# Patient Record
Sex: Female | Born: 1955
Health system: Southern US, Community
[De-identification: ages and names within clinical notes are randomized; demographics above are authoritative.]

## PROBLEM LIST (undated history)

## (undated) DIAGNOSIS — I480 Paroxysmal atrial fibrillation: Secondary | ICD-10-CM

## (undated) DIAGNOSIS — I10 Essential (primary) hypertension: Secondary | ICD-10-CM

## (undated) DIAGNOSIS — E785 Hyperlipidemia, unspecified: Secondary | ICD-10-CM

## (undated) DIAGNOSIS — K5792 Diverticulitis of intestine, part unspecified, without perforation or abscess without bleeding: Secondary | ICD-10-CM

## (undated) DIAGNOSIS — M810 Age-related osteoporosis without current pathological fracture: Secondary | ICD-10-CM

## (undated) HISTORY — DX: Hyperlipidemia, unspecified: E78.5

## (undated) HISTORY — DX: Age-related osteoporosis without current pathological fracture: M81.0

## (undated) HISTORY — DX: Essential (primary) hypertension: I10

## (undated) HISTORY — DX: Diverticulitis of intestine, part unspecified, without perforation or abscess without bleeding: K57.92

## (undated) HISTORY — DX: Paroxysmal atrial fibrillation: I48.0

---

## 1998-05-01 ENCOUNTER — Ambulatory Visit (HOSPITAL_COMMUNITY): Admission: RE | Admit: 1998-05-01 | Discharge: 1998-05-01 | Payer: Self-pay | Admitting: Family Medicine

## 1999-08-04 ENCOUNTER — Other Ambulatory Visit: Admission: RE | Admit: 1999-08-04 | Discharge: 1999-08-04 | Payer: Self-pay | Admitting: Obstetrics and Gynecology

## 2000-08-23 ENCOUNTER — Other Ambulatory Visit: Admission: RE | Admit: 2000-08-23 | Discharge: 2000-08-23 | Payer: Self-pay | Admitting: Obstetrics and Gynecology

## 2001-08-28 ENCOUNTER — Other Ambulatory Visit: Admission: RE | Admit: 2001-08-28 | Discharge: 2001-08-28 | Payer: Self-pay | Admitting: Obstetrics and Gynecology

## 2001-12-22 ENCOUNTER — Encounter: Admission: RE | Admit: 2001-12-22 | Discharge: 2002-01-16 | Payer: Self-pay | Admitting: Family Medicine

## 2002-09-24 ENCOUNTER — Other Ambulatory Visit: Admission: RE | Admit: 2002-09-24 | Discharge: 2002-09-24 | Payer: Self-pay | Admitting: Obstetrics and Gynecology

## 2003-12-16 ENCOUNTER — Other Ambulatory Visit: Admission: RE | Admit: 2003-12-16 | Discharge: 2003-12-16 | Payer: Self-pay | Admitting: Obstetrics and Gynecology

## 2008-01-12 ENCOUNTER — Encounter: Payer: Self-pay | Admitting: Family Medicine

## 2009-09-01 ENCOUNTER — Ambulatory Visit: Payer: Self-pay | Admitting: Family Medicine

## 2009-09-01 DIAGNOSIS — E785 Hyperlipidemia, unspecified: Secondary | ICD-10-CM | POA: Insufficient documentation

## 2009-09-01 DIAGNOSIS — F17200 Nicotine dependence, unspecified, uncomplicated: Secondary | ICD-10-CM | POA: Insufficient documentation

## 2009-09-01 DIAGNOSIS — I10 Essential (primary) hypertension: Secondary | ICD-10-CM | POA: Insufficient documentation

## 2009-09-03 ENCOUNTER — Telehealth: Payer: Self-pay | Admitting: Family Medicine

## 2009-12-04 ENCOUNTER — Ambulatory Visit: Payer: Self-pay | Admitting: Family Medicine

## 2009-12-04 LAB — CONVERTED CEMR LAB
ALT: 18 units/L (ref 0–35)
AST: 19 units/L (ref 0–37)
Albumin: 4.1 g/dL (ref 3.5–5.2)
Cholesterol: 240 mg/dL — ABNORMAL HIGH (ref 0–200)
Direct LDL: 150.4 mg/dL
HDL: 89.3 mg/dL (ref >39.00)
Total Bilirubin: 0.6 mg/dL (ref 0.3–1.2)
Total CHOL/HDL Ratio: 3
Triglycerides: 97 mg/dL (ref 0.0–149.0)

## 2009-12-11 ENCOUNTER — Ambulatory Visit: Payer: Self-pay | Admitting: Family Medicine

## 2010-04-08 ENCOUNTER — Encounter: Payer: Self-pay | Admitting: Family Medicine

## 2010-06-01 ENCOUNTER — Telehealth: Payer: Self-pay | Admitting: Family Medicine

## 2010-06-03 ENCOUNTER — Ambulatory Visit: Payer: Self-pay | Admitting: Family Medicine

## 2010-07-01 ENCOUNTER — Ambulatory Visit: Payer: Self-pay | Admitting: Family Medicine

## 2010-07-02 LAB — CONVERTED CEMR LAB
CO2: 31 meq/L (ref 19–32)
Calcium: 10.1 mg/dL (ref 8.4–10.5)
Chloride: 98 meq/L (ref 96–112)
Creatinine, Ser: 0.6 mg/dL (ref 0.4–1.2)
Sodium: 138 meq/L (ref 135–145)

## 2010-11-10 NOTE — Progress Notes (Signed)
Summary: smoking cessation  Phone Note Call from Patient   Caller: Patient Call For: Evelena Peat MD Summary of Call: Pt wants something to help her quit smoking sent to [facility]. [phone] Initial call taken by: Lynann Beaver CMA,  June 01, 2010 2:14 PM  Follow-up for Phone Call        needs follow up to discuss.  With new data about Chantix need to discuss before use. Follow-up by: Evelena Peat MD,  June 01, 2010 5:43 PM  Additional Follow-up for Phone Call Additional follow up Details #1::        Appt scheduled. Additional Follow-up by: Lynann Beaver CMA,  June 02, 2010 8:15 AM

## 2010-11-10 NOTE — Assessment & Plan Note (Signed)
Summary: discuss smoking cessation/dm   Vital Signs:  Patient profile:   55 year old female Menstrual status:  postmenopausal Weight:      178 pounds Temp:     98.4 degrees F oral BP sitting:   160 / 90  (left arm) Cuff size:   regular  Vitals Entered By: Sid Falcon LPN (June 03, 2010 4:19 PM)  Serial Vital Signs/Assessments:  Time      Position  BP       Pulse  Resp  Temp     By                     172/92                         Evelena Peat MD   History of Present Illness: Patient here for the following.  Here to discuss smoking cessation. Currently smokes about one pack cigarettes per day. Has tried nicotine patches in the past without much success. She does have some interest in Chantix. Does take Prozac but no recent depressive issues. No history of any recent chest pains. No history of CAD.  History of hypertension treated with hydrochlorothiazide. Compliant with therapy. Denies any recent headaches or dizziness. No recent monitoring of blood pressure.  Hyperlipidemia treated with simvastatin. Compliant with therapy.  Hypertension History:      She denies headache, chest pain, palpitations, dyspnea with exertion, orthopnea, peripheral edema, visual symptoms, neurologic problems, syncope, and side effects from treatment.        Positive major cardiovascular risk factors include female age 63 years old or older, hyperlipidemia, hypertension, and current tobacco user.     Allergies (verified): No Known Drug Allergies  Past History:  Past Medical History: Last updated: 09/01/2009 Chicken pox Hyperlipidemia Hypertension  Family History: Last updated: 09/01/2009 Family History of Sudden Death Family history heart disease  Father 53s  Social History: Last updated: 09/01/2009 Occupation:  TEFL teacher Specialist Married Current Smoker, one ppd Alcohol use-yes Regular exercise-no  Risk Factors: Exercise: no (09/01/2009)  Risk Factors: Smoking  Status: current (12/11/2009) Packs/Day: 1.0 (12/11/2009) PMH-FH-SH reviewed for relevance  Review of Systems  The patient denies chest pain, syncope, dyspnea on exertion, peripheral edema, prolonged cough, headaches, hemoptysis, abdominal pain, melena, hematochezia, and severe indigestion/heartburn.    Physical Exam  General:  Well-developed,well-nourished,in no acute distress; alert,appropriate and cooperative throughout examination Neck:  No deformities, masses, or tenderness noted. Lungs:  Normal respiratory effort, chest expands symmetrically. Lungs are clear to auscultation, no crackles or wheezes. Heart:  normal rate, regular rhythm, and no gallop.   Extremities:  no edema.   Impression & Recommendations:  Problem # 1:  TOBACCO ABUSE (ICD-305.1) strategies discussed.  She is interested in Chantix.  No active depression.  Pt aware of recent data regarding and possible link (though small) to CAD.  She also realizes substantial cardiac risk assoc with smoking. Her updated medication list for this problem includes:    Chantix Starting Month Pak 0.5 Mg X 11 & 1 Mg X 42 Tabs (Varenicline tartrate) ..... Use as directed    Chantix Continuing Month Pak 1 Mg Tabs (Varenicline tartrate) ..... Use as directed  Problem # 2:  HYPERTENSION (ICD-401.9) Assessment: Deteriorated start lisinopril hctz.  Follow up one month. Her updated medication list for this problem includes:    Lisinopril-hydrochlorothiazide 10-12.5 Mg Tabs (Lisinopril-hydrochlorothiazide) ..... One by mouth once daily  Problem # 3:  HYPERLIPIDEMIA (ICD-272.4)  Her updated medication list for this problem includes:    Simvastatin 40 Mg Tabs (Simvastatin) ..... Once daily  Complete Medication List: 1)  Lisinopril-hydrochlorothiazide 10-12.5 Mg Tabs (Lisinopril-hydrochlorothiazide) .... One by mouth once daily 2)  Estradiol 0.5 Mg Tabs (Estradiol) .... Once daily 3)  Prometrium 100 Mg Caps (Progesterone micronized) ....  Once daily 4)  Prozac 20 Mg Caps (Fluoxetine hcl) .... Once daily 5)  Simvastatin 40 Mg Tabs (Simvastatin) .... Once daily 6)  Vitamin D (ergocalciferol) 50000 Unit Caps (Ergocalciferol) .... One tab weekly x 4 weeks 7)  Fish Oil 1000 Mg Caps (Omega-3 fatty acids) .... Once daily 8)  Chantix Starting Month Pak 0.5 Mg X 11 & 1 Mg X 42 Tabs (Varenicline tartrate) .... Use as directed 9)  Chantix Continuing Month Pak 1 Mg Tabs (Varenicline tartrate) .... Use as directed  Hypertension Assessment/Plan:      The patient's hypertensive risk group is category B: At least one risk factor (excluding diabetes) with no target organ damage.  Today's blood pressure is 160/90.    Patient Instructions: 1)  Stop smoking tips: Choose a quit date. Cut down before the quit date. Decide what you will do as a substitute when you feel the urge to smoke(gum, toothpick, exercise).  2)  Check your  Blood Pressure regularly . If it is above: 140/90  you should make an appointment. 3)  Please schedule a follow-up appointment in 1 month.  Prescriptions: LISINOPRIL-HYDROCHLOROTHIAZIDE 10-12.5 MG TABS (LISINOPRIL-HYDROCHLOROTHIAZIDE) one by mouth once daily  #30 x 5   Entered and Authorized by:   Evelena Peat MD   Signed by:   Evelena Peat MD on 06/03/2010   Method used:   Print then Give to Patient   RxID:   [id number] CHANTIX CONTINUING MONTH PAK 1 MG TABS (VARENICLINE TARTRATE) use as directed  #1 x 1   Entered and Authorized by:   Evelena Peat MD   Signed by:   Evelena Peat MD on 06/03/2010   Method used:   Print then Give to Patient   RxID:   [id number] CHANTIX STARTING MONTH PAK 0.5 MG X 11 & 1 MG X 42 TABS (VARENICLINE TARTRATE) use as directed  #1 x 0   Entered and Authorized by:   Evelena Peat MD   Signed by:   Evelena Peat MD on 06/03/2010   Method used:   Print then Give to Patient   RxID:   [id number]

## 2010-11-10 NOTE — Assessment & Plan Note (Signed)
Summary: 3 month rov/njr   Vital Signs:  Patient profile:   55 year old female Menstrual status:  postmenopausal Weight:      175 pounds Temp:     99.0 degrees F oral BP sitting:   130 / 82  (left arm) Cuff size:   regular  Vitals Entered By: Sid Falcon LPN (December 11, 452 4:08 PM)   History of Present Illness: Patient here for complete physical examination. Gets GYN care elsewhere.  Patient gets yearly mammograms in April. Pap Smears through gynecologist. Last tetanus unknown but probably greater than 10 years. No history of screening colonoscopy. Smokes one pack cigarettes per day. No regular exercise.  Recent lab work reviewed with patient. Elevated cholesterol but excellent HDL. Current medications reviewed. Chronic problems include hypertension, hyperlipidemia, and tobacco use.  Social history and family history reviewed and as recorded elsewhere.  patient relates one episode last December of some chest pressure which lasted a few minutes and resolved. None whatsoever since then. No symptoms with exercise or walking but she does not exercise consistently. She has some chronic dyspnea which she attributes to smoking which is unchanged.  Preventive Screening-Counseling & Management  Alcohol-Tobacco     Smoking Status: current     Packs/Day: 1.0     Year Quit: 1973  Allergies (verified): No Known Drug Allergies  Past History:  Past Medical History: Last updated: 09/01/2009 Chicken pox Hyperlipidemia Hypertension  Family History: Last updated: 09/01/2009 Family History of Sudden Death Family history heart disease  Father 67s  Social History: Last updated: 09/01/2009 Occupation:  Client Support Specialist Married Current Smoker, one ppd Alcohol use-yes Regular exercise-no  Risk Factors: Exercise: no (09/01/2009)  Risk Factors: Smoking Status: current (12/11/2009) Packs/Day: 1.0 (12/11/2009) PMH-FH-SH reviewed for relevance  Review of Systems   The patient complains of weight gain.  The patient denies anorexia, fever, weight loss, vision loss, decreased hearing, hoarseness, chest pain, syncope, dyspnea on exertion, peripheral edema, prolonged cough, headaches, hemoptysis, abdominal pain, melena, hematochezia, severe indigestion/heartburn, hematuria, incontinence, muscle weakness, suspicious skin lesions, depression, enlarged lymph nodes, and breast masses.    Physical Exam  General:  Well-developed,well-nourished,in no acute distress; alert,appropriate and cooperative throughout examination Head:  Normocephalic and atraumatic without obvious abnormalities. No apparent alopecia or balding. Eyes:  No corneal or conjunctival inflammation noted. EOMI. Perrla. Funduscopic exam benign, without hemorrhages, exudates or papilledema. Vision grossly normal. Ears:  External ear exam shows no significant lesions or deformities.  Otoscopic examination reveals clear canals, tympanic membranes are intact bilaterally without bulging, retraction, inflammation or discharge. Hearing is grossly normal bilaterally. Mouth:  Oral mucosa and oropharynx without lesions or exudates.  Teeth in good repair. Neck:  No deformities, masses, or tenderness noted. Breasts:  per GYN Lungs:  Normal respiratory effort, chest expands symmetrically. Lungs are clear to auscultation, no crackles or wheezes. Heart:  Normal rate and regular rhythm. S1 and S2 normal without gallop, murmur, click, rub or other extra sounds. Abdomen:  Bowel sounds positive,abdomen soft and non-tender without masses, organomegaly or hernias noted. Genitalia:  per GYN Msk:  No deformity or scoliosis noted of thoracic or lumbar spine.   Extremities:  No clubbing, cyanosis, edema, or deformity noted with normal full range of motion of all joints.   Neurologic:  alert & oriented X3, cranial nerves II-XII intact, and strength normal in all extremities.   Skin:  Intact without suspicious lesions or  rashes Cervical Nodes:  No lymphadenopathy noted Psych:  Cognition and judgment appear intact.  Alert and cooperative with normal attention span and concentration. No apparent delusions, illusions, hallucinations   Impression & Recommendations:  Problem # 1:  Preventive Health Care (ICD-V70.0) patient needs to quit smoking. Discussed at length. Tetanus booster given. Labs reviewed with patient. Baseline EKG obtained and unremarkable. Continue regular GYN checkups. Work on weight loss and exercise. Prompt followup if she has any increased dyspnea or any chest symptoms with exercise. Patient needs screening colonoscopy but refuses at this time. She will contact us if she is willing to schedule.  Complete Medication List: 1)  Hydrochlorothiazide 12.5 Mg Caps (Hydrochlorothiazide) .... Once daily 2)  Estradiol 0.5 Mg Tabs (Estradiol) .... Once daily 3)  Prometrium 100 Mg Caps (Progesterone micronized) .... Once daily 4)  Prozac 20 Mg Caps (Fluoxetine hcl) .... Once daily 5)  Simvastatin 40 Mg Tabs (Simvastatin) .... Once daily 6)  Vitamin D (ergocalciferol) 50000 Unit Caps (Ergocalciferol) .... One tab weekly x 4 weeks 7)  Fish Oil 1000 Mg Caps (Omega-3 fatty acids) .... Once daily  Other Orders: Tdap => 74yrs IM (56213) Admin 1st Vaccine (08657)  Patient Instructions: 1)  Stop smoking tips: Choose a quit date. Cut down before the quit date. Decide what you will do as a substitute when you feel the urge to smoke(gum, toothpick, exercise).  2)  You need to consider colonoscopy later this year. Contact us if you're willing to schedule. 3)  Check your  Blood Pressure regularly . If it is above:140/90   you should make an appointment. 4)  It is important that you exercise reguarly at least 20 minutes 5 times a week. If you develop chest pain, have severe difficulty breathing, or feel very tired, stop exercising immediately and seek medical attention.  5)  Schedule your mammogram.   Prescriptions: SIMVASTATIN 40 MG TABS (SIMVASTATIN) once daily  #90 x 3   Entered and Authorized by:   Evelena Peat MD   Signed by:   Evelena Peat MD on 12/11/2009   Method used:   Print then Give to Patient   RxID:   [id number] PROZAC 20 MG CAPS (FLUOXETINE HCL) once daily  #90 x 3   Entered and Authorized by:   Evelena Peat MD   Signed by:   Evelena Peat MD on 12/11/2009   Method used:   Print then Give to Patient   RxID:   [id number] HYDROCHLOROTHIAZIDE 12.5 MG CAPS (HYDROCHLOROTHIAZIDE) once daily  #90 x 3   Entered and Authorized by:   Evelena Peat MD   Signed by:   Evelena Peat MD on 12/11/2009   Method used:   Print then Give to Patient   RxID:   [id number]    Immunizations Administered:  Tetanus Vaccine:    Vaccine Type: Tdap    Site: left deltoid    Mfr: GlaxoSmithKline    Dose: 0.5 ml    Route: IM    Given by: Sid Falcon LPN    Exp. Date: 12/06/2011    Lot #: [id number]

## 2010-11-10 NOTE — Assessment & Plan Note (Signed)
Summary: 1 month rov/njr   Vital Signs:  Patient profile:   55 year old female Menstrual status:  postmenopausal Weight:      178 pounds Temp:     98.7 degrees F oral BP sitting:   132 / 74  (left arm) Cuff size:   regular  Vitals Entered By: Sid Falcon LPN (July 01, 2010 8:40 AM) CC: one month follow-up   History of Present Illness: Patient here for followup hypertension. Started lisinopril HCTZ and tolerating well no side effects. No cough. Blood pressure ranging 130-150 systolic at home. No dizziness.  Patient quit smoking since last visit. Never filled Chantix.   She is using nicotine patch.  Allergies (verified): No Known Drug Allergies  Past History:  Past Medical History: Last updated: 09/01/2009 Chicken pox Hyperlipidemia Hypertension PMH reviewed for relevance  Physical Exam  General:  Well-developed,well-nourished,in no acute distress; alert,appropriate and cooperative throughout examination Neck:  No deformities, masses, or tenderness noted. Lungs:  Normal respiratory effort, chest expands symmetrically. Lungs are clear to auscultation, no crackles or wheezes. Heart:  Normal rate and regular rhythm. S1 and S2 normal without gallop, murmur, click, rub or other extra sounds.   Impression & Recommendations:  Problem # 1:  HYPERTENSION (ICD-401.9) Assessment Improved check BMP. Her updated medication list for this problem includes:    Lisinopril-hydrochlorothiazide 10-12.5 Mg Tabs (Lisinopril-hydrochlorothiazide) ..... One by mouth once daily  Orders: TLB-BMP (Basic Metabolic Panel-BMET) (80048-METABOL) Specimen Handling (16109) Venipuncture (60454)  Complete Medication List: 1)  Lisinopril-hydrochlorothiazide 10-12.5 Mg Tabs (Lisinopril-hydrochlorothiazide) .... One by mouth once daily 2)  Estradiol 0.5 Mg Tabs (Estradiol) .... Once daily 3)  Prometrium 100 Mg Caps (Progesterone micronized) .... Once daily 4)  Prozac 20 Mg Caps (Fluoxetine  hcl) .... Once daily 5)  Simvastatin 40 Mg Tabs (Simvastatin) .... Once daily 6)  Vitamin D (ergocalciferol) 50000 Unit Caps (Ergocalciferol) .... One tab weekly x 4 weeks 7)  Fish Oil 1000 Mg Caps (Omega-3 fatty acids) .... Once daily  Patient Instructions: 1)  Please schedule a follow-up appointment in 6 months .  2)  Check your  Blood Pressure regularly . If it is above:140/90   you should make an appointment.

## 2010-12-29 ENCOUNTER — Encounter: Payer: Self-pay | Admitting: Family Medicine

## 2010-12-30 ENCOUNTER — Ambulatory Visit (INDEPENDENT_AMBULATORY_CARE_PROVIDER_SITE_OTHER): Payer: 59 | Admitting: Family Medicine

## 2010-12-30 ENCOUNTER — Encounter: Payer: Self-pay | Admitting: Family Medicine

## 2010-12-30 DIAGNOSIS — R519 Headache, unspecified: Secondary | ICD-10-CM

## 2010-12-30 DIAGNOSIS — H6690 Otitis media, unspecified, unspecified ear: Secondary | ICD-10-CM

## 2010-12-30 DIAGNOSIS — H6691 Otitis media, unspecified, right ear: Secondary | ICD-10-CM

## 2010-12-30 DIAGNOSIS — I10 Essential (primary) hypertension: Secondary | ICD-10-CM

## 2010-12-30 DIAGNOSIS — R51 Headache: Secondary | ICD-10-CM

## 2010-12-30 MED ORDER — LISINOPRIL-HYDROCHLOROTHIAZIDE 20-12.5 MG PO TABS: 1.0000 | ORAL_TABLET | Freq: Every day | ORAL | Status: DC

## 2010-12-30 MED ORDER — AMOXICILLIN 875 MG PO TABS: 875.0000 mg | ORAL_TABLET | Freq: Two times a day (BID) | ORAL | Status: AC

## 2010-12-30 NOTE — Progress Notes (Signed)
  Subjective:    Patient ID: Stephanie [name], female    DOB: 1956-07-30, 55 y.o.   MRN: [id number]  HPI  Patient seen for the several following items   Hypertension treated with lisinopril HCTZ. Not monitoring blood pressures frequent but usually over 140/90. No headaches or dizziness. No side effects from medication.    Recent URI type symptoms. No ear pain. Nasal congestion. No  purulent nasal discharge. Rare cough. No fever.    She describes intermittent somewhat progressive headaches which are very transient usually lasting about one minute which occur unpredictably and start occipital and spread frontally and bilateral. Activity worsens. No associated nausea, vomiting, visual changes, or any photophobia. No history of migraines. Has tried tapering caffeine recently but again these headaches are very transient usually lasting one minute.   Review of Systems  Constitutional: Negative for fever, chills, appetite change and fatigue.  Eyes: Negative for photophobia and visual disturbance.  Respiratory: Negative for cough, shortness of breath and wheezing.   Cardiovascular: Negative for chest pain, palpitations and leg swelling.  Neurological: Positive for headaches. Negative for dizziness, tremors, seizures, syncope and weakness.  Hematological: Negative for adenopathy.  Psychiatric/Behavioral: Negative for confusion.       Objective:   Physical Exam  patient is alert and healthy in appearance  Blood pressure 150/90 by my check Oropharynx is clear Right eardrum is erythematous was somewhat distorted landmarks. Left ear drum is normal Oropharynx is moist and clear Neck supple with no adenopathy Chest good auscultation Heart regular rate Neuro exam cranial nerves all intact. No focal strength deficits. No ataxia.       Assessment & Plan:   #1 hypertension suboptimal control. Increase lisinopril HCTZ 20/12.5 one daily and reassess blood pressure within 3 months. Consider fasting  lipids then.  #2 recent URI with probable early right suppurative otitis. Stat 875 mg amoxicillin twice a day for 10 days #3 atypical headaches. Referral headache wellness Center given the fact these do not fit criteria for migraine and do not sound like tension type

## 2011-01-24 ENCOUNTER — Emergency Department (HOSPITAL_BASED_OUTPATIENT_CLINIC_OR_DEPARTMENT_OTHER)
Admission: EM | Admit: 2011-01-24 | Discharge: 2011-01-24 | Disposition: A | Discharge status: Home / Self Care | Payer: 59 | Attending: Emergency Medicine | Admitting: Emergency Medicine

## 2011-01-24 ENCOUNTER — Emergency Department (INDEPENDENT_AMBULATORY_CARE_PROVIDER_SITE_OTHER): Payer: 59

## 2011-01-24 DIAGNOSIS — W208XXA Other cause of strike by thrown, projected or falling object, initial encounter: Secondary | ICD-10-CM | POA: Insufficient documentation

## 2011-01-24 DIAGNOSIS — E785 Hyperlipidemia, unspecified: Secondary | ICD-10-CM | POA: Insufficient documentation

## 2011-01-24 DIAGNOSIS — Z79899 Other long term (current) drug therapy: Secondary | ICD-10-CM | POA: Insufficient documentation

## 2011-01-24 DIAGNOSIS — Y92009 Unspecified place in unspecified non-institutional (private) residence as the place of occurrence of the external cause: Secondary | ICD-10-CM | POA: Insufficient documentation

## 2011-01-24 DIAGNOSIS — I10 Essential (primary) hypertension: Secondary | ICD-10-CM | POA: Insufficient documentation

## 2011-01-24 DIAGNOSIS — R42 Dizziness and giddiness: Secondary | ICD-10-CM | POA: Insufficient documentation

## 2011-01-24 DIAGNOSIS — S060X0A Concussion without loss of consciousness, initial encounter: Secondary | ICD-10-CM | POA: Insufficient documentation

## 2011-01-24 DIAGNOSIS — R51 Headache: Secondary | ICD-10-CM

## 2011-01-28 ENCOUNTER — Other Ambulatory Visit: Payer: Self-pay | Admitting: Family Medicine

## 2011-03-16 ENCOUNTER — Other Ambulatory Visit: Payer: Self-pay | Admitting: Family Medicine

## 2011-03-31 ENCOUNTER — Ambulatory Visit: Payer: 59 | Admitting: Family Medicine

## 2011-04-07 ENCOUNTER — Ambulatory Visit: Payer: 59 | Admitting: Family Medicine

## 2011-04-23 ENCOUNTER — Encounter: Payer: Self-pay | Admitting: Family Medicine

## 2011-04-23 ENCOUNTER — Ambulatory Visit (INDEPENDENT_AMBULATORY_CARE_PROVIDER_SITE_OTHER): Payer: 59 | Admitting: Family Medicine

## 2011-04-23 DIAGNOSIS — I10 Essential (primary) hypertension: Secondary | ICD-10-CM

## 2011-04-23 DIAGNOSIS — E785 Hyperlipidemia, unspecified: Secondary | ICD-10-CM

## 2011-04-23 NOTE — Progress Notes (Signed)
  Subjective:    Patient ID: Stephanie [name], female    DOB: [birth year], 55 y.o.   MRN: [id number]  HPI Patient seen for followup. Hypertension and hyperlipidemia history. Quit smoking last October. Compliant with medications. Overdue for lab work. She plans to schedule complete physical in the next few months. Still has some atypical headaches.  Evaluated [facility] and briefly used gabapentin but had side effects of sedation and dizziness. Headaches are somewhat infrequent at this time.  Past Medical History  Diagnosis Date  . Hypertension   . Hyperlipidemia    No past surgical history on file.  reports that she quit smoking about 10 months ago. Her smoking use included Cigarettes. She has a 30 pack-year smoking history. She does not have any smokeless tobacco history on file. She reports that she drinks alcohol. Her drug history not on file. family history includes Heart disease in her father and Sudden death in an unspecified family member. No Known Allergies    Review of Systems  Constitutional: Negative for fatigue.  Eyes: Negative for visual disturbance.  Respiratory: Negative for cough, chest tightness, shortness of breath and wheezing.   Cardiovascular: Negative for chest pain, palpitations and leg swelling.  Neurological: Negative for dizziness, seizures, syncope, weakness, light-headedness and headaches.       Objective:   Physical Exam  Constitutional: She appears well-developed and well-nourished. No distress.  Cardiovascular: Normal rate, regular rhythm and normal heart sounds.   No murmur heard. Pulmonary/Chest: Effort normal and breath sounds normal. No respiratory distress. She has no wheezes. She has no rales.  Musculoskeletal: She exhibits no edema.          Assessment & Plan:  #1 hypertension improved continue lisinopril HCTZ. Continue regular exercise with walking and weight loss #2 hyperlipidemia. Needs repeat lipids. We'll schedule complete  physical

## 2011-05-03 ENCOUNTER — Encounter: Payer: Self-pay | Admitting: Family Medicine

## 2011-06-16 ENCOUNTER — Other Ambulatory Visit (INDEPENDENT_AMBULATORY_CARE_PROVIDER_SITE_OTHER): Payer: 59

## 2011-06-16 DIAGNOSIS — Z0000 Encounter for general adult medical examination without abnormal findings: Secondary | ICD-10-CM

## 2011-06-16 LAB — POCT URINALYSIS DIPSTICK
Ketones, UA: NEGATIVE
Protein, UA: NEGATIVE
Spec Grav, UA: 1.005
pH, UA: 5.5

## 2011-06-16 LAB — HEPATIC FUNCTION PANEL
ALT: 18 U/L (ref 0–35)
AST: 18 U/L (ref 0–37)
Alkaline Phosphatase: 60 U/L (ref 39–117)
Bilirubin, Direct: 0 mg/dL (ref 0.0–0.3)
Total Protein: 6.9 g/dL (ref 6.0–8.3)

## 2011-06-16 LAB — BASIC METABOLIC PANEL
BUN: 9 mg/dL (ref 6–23)
CO2: 26 mEq/L (ref 19–32)
Calcium: 9.5 mg/dL (ref 8.4–10.5)
Glucose, Bld: 94 mg/dL (ref 70–99)
Potassium: 4.9 mEq/L (ref 3.5–5.1)
Sodium: 133 mEq/L — ABNORMAL LOW (ref 135–145)

## 2011-06-16 LAB — LDL CHOLESTEROL, DIRECT: Direct LDL: 132.6 mg/dL

## 2011-06-16 LAB — CBC WITH DIFFERENTIAL/PLATELET
Basophils Absolute: 0 10*3/uL (ref 0.0–0.1)
Eosinophils Absolute: 0.1 10*3/uL (ref 0.0–0.7)
HCT: 39.3 % (ref 36.0–46.0)
Hemoglobin: 13.1 g/dL (ref 12.0–15.0)
Lymphs Abs: 1.5 10*3/uL (ref 0.7–4.0)
MCHC: 33.3 g/dL (ref 30.0–36.0)
Neutro Abs: 4.1 10*3/uL (ref 1.4–7.7)
RDW: 12.9 % (ref 11.5–14.6)

## 2011-06-18 ENCOUNTER — Other Ambulatory Visit: Payer: 59

## 2011-06-24 ENCOUNTER — Encounter: Payer: Self-pay | Admitting: Family Medicine

## 2011-06-24 ENCOUNTER — Encounter: Payer: 59 | Admitting: Family Medicine

## 2011-06-24 ENCOUNTER — Ambulatory Visit (INDEPENDENT_AMBULATORY_CARE_PROVIDER_SITE_OTHER): Payer: 59 | Admitting: Family Medicine

## 2011-06-24 VITALS — BP 140/82 | HR 72 | Temp 98.6°F | Resp 12 | Ht 67.0 in | Wt 187.0 lb

## 2011-06-24 DIAGNOSIS — Z0000 Encounter for general adult medical examination without abnormal findings: Secondary | ICD-10-CM

## 2011-06-24 NOTE — Progress Notes (Signed)
  Subjective:    Patient ID: Stephanie [name], female    DOB: 1956-07-24, 55 y.o.   MRN: [id number]  HPI Patient here for complete physical. She sees a gynecologist for Pap smears. Mammogram earlier this year normal.  Receives flu vaccine through work. Quit smoking over one year ago. No history of colonoscopy. Pneumovax 2010. Tetanus last year. Patient not exercising consistently. Family history and social history reviewed and as below  Past Medical History  Diagnosis Date  . Hypertension   . Hyperlipidemia    No past surgical history on file.  reports that she quit smoking about 12 months ago. Her smoking use included Cigarettes. She has a 30 pack-year smoking history. She does not have any smokeless tobacco history on file. She reports that she drinks alcohol. Her drug history not on file. family history includes Heart disease in her father and Sudden death in an unspecified family member. No Known Allergies    Review of Systems  Constitutional: Negative for fever, activity change, appetite change and fatigue.  HENT: Negative for hearing loss, ear pain, sore throat and trouble swallowing.        Patient frequently clears her throat. She thinks this is probably due to postnasal drip. No active reflux symptoms. Denies hoarseness.  Eyes: Negative for visual disturbance.  Respiratory: Negative for cough and shortness of breath.   Cardiovascular: Negative for chest pain and palpitations.  Gastrointestinal: Negative for abdominal pain, diarrhea, constipation and blood in stool.  Genitourinary: Negative for dysuria and hematuria.  Musculoskeletal: Negative for myalgias, back pain and arthralgias.  Skin: Negative for rash.  Neurological: Negative for dizziness, syncope and headaches.  Hematological: Negative for adenopathy.  Psychiatric/Behavioral: Negative for confusion and dysphoric mood.       Objective:   Physical Exam  Constitutional: She is oriented to person, place, and time. She  appears well-developed and well-nourished.  HENT:  Head: Normocephalic and atraumatic.  Eyes: EOM are normal. Pupils are equal, round, and reactive to light.  Neck: Normal range of motion. Neck supple. No thyromegaly present.  Cardiovascular: Normal rate, regular rhythm and normal heart sounds.   No murmur heard. Pulmonary/Chest: Breath sounds normal. No respiratory distress. She has no wheezes. She has no rales.  Abdominal: Soft. Bowel sounds are normal. She exhibits no distension and no mass. There is no tenderness. There is no rebound and no guarding.  Genitourinary:       As per GYN  Musculoskeletal: Normal range of motion. She exhibits no edema.  Lymphadenopathy:    She has no cervical adenopathy.  Neurological: She is alert and oriented to person, place, and time. She displays normal reflexes. No cranial nerve deficit.  Skin: No rash noted.  Psychiatric: She has a normal mood and affect. Her behavior is normal. Judgment and thought content normal.          Assessment & Plan:  Health maintenance. Labs reviewed with patient and basically all favorable. Recommend colonoscopy the patient is still not sure she wishes to pursue. Reminder for flu vaccine. Continue GYN followup. Try over-the-counter Allegra for postnasal drip symptoms

## 2011-06-24 NOTE — Patient Instructions (Signed)
Try over the counter Allegra for postnasal drip symptoms. Let me know if you decide to pursue colonoscopy.

## 2011-12-06 ENCOUNTER — Other Ambulatory Visit: Payer: Self-pay | Admitting: Family Medicine

## 2012-01-09 ENCOUNTER — Other Ambulatory Visit: Payer: Self-pay | Admitting: Family Medicine

## 2012-01-17 ENCOUNTER — Telehealth: Payer: Self-pay | Admitting: *Deleted

## 2012-01-17 NOTE — Telephone Encounter (Signed)
(  triage voicemail) Pt wants to know if she has had a tetanus shot recently since her daughter is due to have a baby any day now.

## 2012-01-17 NOTE — Telephone Encounter (Signed)
T-Dap 2011, pt informed

## 2012-01-27 ENCOUNTER — Other Ambulatory Visit: Payer: Self-pay | Admitting: Family Medicine

## 2012-02-03 ENCOUNTER — Encounter: Payer: Self-pay | Admitting: Family Medicine

## 2012-02-03 ENCOUNTER — Ambulatory Visit (INDEPENDENT_AMBULATORY_CARE_PROVIDER_SITE_OTHER): Payer: 59 | Admitting: Family Medicine

## 2012-02-03 VITALS — BP 130/70 | Temp 98.1°F | Wt 187.0 lb

## 2012-02-03 DIAGNOSIS — R443 Hallucinations, unspecified: Secondary | ICD-10-CM

## 2012-02-03 DIAGNOSIS — R51 Headache: Secondary | ICD-10-CM

## 2012-02-03 DIAGNOSIS — G8929 Other chronic pain: Secondary | ICD-10-CM

## 2012-02-03 DIAGNOSIS — R442 Other hallucinations: Secondary | ICD-10-CM

## 2012-02-03 MED ORDER — NORTRIPTYLINE HCL 10 MG PO CAPS: 10.0000 mg | ORAL_CAPSULE | Freq: Every day | ORAL | Status: DC

## 2012-02-03 NOTE — Patient Instructions (Addendum)
  Increase Pamelor in 2 weeks to two tablets if no relief with one.

## 2012-02-03 NOTE — Progress Notes (Signed)
  Subjective:    Patient ID: Stephanie [name], female    DOB: May 10, 1956, 56 y.o.   MRN: [id number]  HPI  Patient seen with 2 week history of smelling cigarette smoke but no cigarette smoke present. 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  She quit smoking back in 2011. Other coworkers have not noted similar smell. Symptoms are constant. She has not had any loss of taste. Denies any nasal congestion or nasal drainage. No allergy symptoms.  Other issues she's had some chronic bilateral occipital headaches. These are sometimes dull sometimes throbbing. Sometimes 10 out of 10 severity. Intermittent usually occurring 10/30 days out of the month.  No clear triggers. She denies any associated appetite or weight changes, nausea, vomiting, photophobia, or any clear exacerbating features. Ibuprofen does not help. [facility] and was not given any treatment. Denies any radiculopathy symptoms. No numbness or weakness.  CT head 2012 no acute abnormality.  Past Medical History  Diagnosis Date  . Hypertension   . Hyperlipidemia    No past surgical history on file.  reports that she quit smoking about 20 months ago. Her smoking use included Cigarettes. She has a 30 pack-year smoking history. She does not have any smokeless tobacco history on file. She reports that she drinks alcohol. Her drug history not on file. family history includes Heart disease in her father and Sudden death in an unspecified family member. No Known Allergies    Review of Systems  Constitutional: Negative for appetite change, fatigue and unexpected weight change.  HENT: Negative for hearing loss, ear pain, nosebleeds, congestion, sore throat, rhinorrhea, sneezing, trouble swallowing, neck pain, postnasal drip, sinus pressure and tinnitus.   Respiratory: Negative for shortness of breath.   Cardiovascular: Negative for chest pain.  Neurological: Positive for headaches.  Negative for dizziness, tremors, seizures, syncope, speech difficulty, weakness and numbness.  Psychiatric/Behavioral: Negative for confusion.       Objective:   Physical Exam  Constitutional: She is oriented to person, place, and time. She appears well-developed and well-nourished.  HENT:  Right Ear: External ear normal.  Left Ear: External ear normal.  Nose: Nose normal.  Mouth/Throat: Oropharynx is clear and moist.  Neck: Neck supple. No thyromegaly present.  Cardiovascular: Normal rate and regular rhythm.   Pulmonary/Chest: Effort normal and breath sounds normal. No respiratory distress. She has no wheezes. She has no rales.  Musculoskeletal: She exhibits no edema.  Neurological: She is alert and oriented to person, place, and time. She has normal reflexes. No cranial nerve deficit.       No focal strength deficits. Cerebellar function normal.          Assessment & Plan:  #1 Phantosmia.  Duration of 2 weeks. We'll look into this further with some research to see if we can find any good evidence for treatment. She does not have any evidence for sinus infection #2 chronic headaches. No atypical features.  Question chronic tension-type. She's tried heat and ibuprofen without relief. Trial of nortriptyline 10 mg each bedtime and titrate for 2 weeks to 2 tablets if no relief. Touch base 3-4 weeks if no improvement

## 2012-05-03 ENCOUNTER — Encounter: Payer: Self-pay | Admitting: Family Medicine

## 2012-06-21 ENCOUNTER — Other Ambulatory Visit (INDEPENDENT_AMBULATORY_CARE_PROVIDER_SITE_OTHER): Payer: 59

## 2012-06-21 DIAGNOSIS — Z79899 Other long term (current) drug therapy: Secondary | ICD-10-CM

## 2012-06-21 DIAGNOSIS — Z0000 Encounter for general adult medical examination without abnormal findings: Secondary | ICD-10-CM

## 2012-06-21 LAB — CBC WITH DIFFERENTIAL/PLATELET
Basophils Absolute: 0 10*3/uL (ref 0.0–0.1)
Eosinophils Absolute: 0.1 10*3/uL (ref 0.0–0.7)
HCT: 40.3 % (ref 36.0–46.0)
Hemoglobin: 13.6 g/dL (ref 12.0–15.0)
Lymphs Abs: 1.5 10*3/uL (ref 0.7–4.0)
MCHC: 33.6 g/dL (ref 30.0–36.0)
MCV: 92.5 fl (ref 78.0–100.0)
Monocytes Absolute: 0.6 10*3/uL (ref 0.1–1.0)
Neutro Abs: 2.8 10*3/uL (ref 1.4–7.7)
RDW: 12.3 % (ref 11.5–14.6)

## 2012-06-21 LAB — LIPID PANEL
Cholesterol: 239 mg/dL — ABNORMAL HIGH (ref 0–200)
Total CHOL/HDL Ratio: 3
Triglycerides: 78 mg/dL (ref 0.0–149.0)
VLDL: 15.6 mg/dL (ref 0.0–40.0)

## 2012-06-21 LAB — POCT URINALYSIS DIPSTICK
Glucose, UA: NEGATIVE
Nitrite, UA: NEGATIVE
Protein, UA: NEGATIVE
Urobilinogen, UA: 0.2

## 2012-06-21 LAB — BASIC METABOLIC PANEL
CO2: 27 mEq/L (ref 19–32)
Calcium: 9.6 mg/dL (ref 8.4–10.5)
Chloride: 101 mEq/L (ref 96–112)
Glucose, Bld: 86 mg/dL (ref 70–99)
Potassium: 5.3 mEq/L — ABNORMAL HIGH (ref 3.5–5.1)
Sodium: 136 mEq/L (ref 135–145)

## 2012-06-21 LAB — HEPATIC FUNCTION PANEL: Albumin: 4.1 g/dL (ref 3.5–5.2)

## 2012-06-21 LAB — TSH: TSH: 1.46 u[IU]/mL (ref 0.35–5.50)

## 2012-06-22 LAB — LDL CHOLESTEROL, DIRECT: Direct LDL: 149.5 mg/dL

## 2012-06-28 ENCOUNTER — Encounter: Payer: Self-pay | Admitting: Family Medicine

## 2012-06-28 ENCOUNTER — Ambulatory Visit (INDEPENDENT_AMBULATORY_CARE_PROVIDER_SITE_OTHER): Payer: 59 | Admitting: Family Medicine

## 2012-06-28 VITALS — BP 122/72 | HR 72 | Temp 98.4°F | Resp 12 | Ht 67.0 in | Wt 189.0 lb

## 2012-06-28 DIAGNOSIS — Z23 Encounter for immunization: Secondary | ICD-10-CM

## 2012-06-28 DIAGNOSIS — Z0000 Encounter for general adult medical examination without abnormal findings: Secondary | ICD-10-CM

## 2012-06-28 NOTE — Addendum Note (Signed)
Addended by: Melchor Amour on: 06/28/2012 09:44 AM   Modules accepted: Orders

## 2012-06-28 NOTE — Progress Notes (Signed)
  Subjective:    Patient ID: Stephanie [name], female    DOB: 1955-12-26, 56 y.o.   MRN: [id number]  HPI  Patient seen for complete physical. She continues to see gynecologist. She is getting mammograms and Pap smears through them. Her other medical problems include history of hypertension and hyperlipidemia. She quit smoking couple years ago. Had some weight gain since then. No consistent exercise. No flu vaccine yet. She's never had screening colonoscopy and has refused in the past. She is still not agreeable to setting this up at this time. Generally feels well. No specific complaints. Tetanus up-to-date  Patient had some phantosmia last visit and those symptoms have fully resolved.  Past Medical History  Diagnosis Date  . Hypertension   . Hyperlipidemia    No past surgical history on file.  reports that she quit smoking about 2 years ago. Her smoking use included Cigarettes. She has a 30 pack-year smoking history. She does not have any smokeless tobacco history on file. She reports that she drinks alcohol. Her drug history not on file. family history includes Alzheimer's disease in her mother; Heart disease (age of onset:50) in her brother and father; and Sudden death in an unspecified family member. No Known Allergies    Review of Systems  Constitutional: Negative for fever, activity change, appetite change, fatigue and unexpected weight change.  HENT: Negative for hearing loss, ear pain, sore throat and trouble swallowing.   Eyes: Negative for visual disturbance.  Respiratory: Negative for cough, shortness of breath and wheezing.   Cardiovascular: Negative for chest pain and palpitations.  Gastrointestinal: Negative for abdominal pain, diarrhea, constipation and blood in stool.  Genitourinary: Negative for dysuria and hematuria.  Musculoskeletal: Negative for myalgias, back pain and arthralgias.  Skin: Negative for rash.  Neurological: Negative for dizziness, syncope and headaches.    Hematological: Negative for adenopathy.  Psychiatric/Behavioral: Negative for confusion and dysphoric mood.       Objective:   Physical Exam  Constitutional: She is oriented to person, place, and time. She appears well-developed and well-nourished.  HENT:  Head: Normocephalic and atraumatic.  Eyes: EOM are normal. Pupils are equal, round, and reactive to light.  Neck: Normal range of motion. Neck supple. No thyromegaly present.  Cardiovascular: Normal rate, regular rhythm and normal heart sounds.   No murmur heard. Pulmonary/Chest: Breath sounds normal. No respiratory distress. She has no wheezes. She has no rales.  Abdominal: Soft. Bowel sounds are normal. She exhibits no distension and no mass. There is no tenderness. There is no rebound and no guarding.  Genitourinary:       Per gyn   Musculoskeletal: Normal range of motion. She exhibits no edema.  Lymphadenopathy:    She has no cervical adenopathy.  Neurological: She is alert and oriented to person, place, and time. She displays normal reflexes. No cranial nerve deficit.  Skin: No rash noted.  Psychiatric: She has a normal mood and affect. Her behavior is normal. Judgment and thought content normal.          Assessment & Plan:  Health maintenance. Flu vaccine given. Colonoscopy recommended and patient refuses. She also declines Hemoccult cards. She'll let us know she change her mind. Discussed establishing regular exercise and gradual calorie reduction to achieve weight loss goals. Labs reviewed with patient. Just slightly high cholesterol but excellent HDL. She will continue GYN followup

## 2012-07-11 ENCOUNTER — Telehealth: Payer: Self-pay | Admitting: Family Medicine

## 2012-07-11 MED ORDER — LISINOPRIL-HYDROCHLOROTHIAZIDE 20-12.5 MG PO TABS: 1.0000 | ORAL_TABLET | Freq: Every day | ORAL | Status: DC

## 2012-07-11 MED ORDER — FLUOXETINE HCL 20 MG PO CAPS: 20.0000 mg | ORAL_CAPSULE | Freq: Every day | ORAL | Status: DC

## 2012-07-11 MED ORDER — SIMVASTATIN 40 MG PO TABS: 40.0000 mg | ORAL_TABLET | Freq: Every day | ORAL | Status: DC

## 2012-07-11 NOTE — Telephone Encounter (Signed)
She said they just changed mail order Rx companies at work and for some reason her Simvastatin, Fluoxetine and Lisinopril-HCTZ did not transfer.  She needs these sent to [facility] she doesn't  Have fax but phone is [phone]

## 2012-08-27 ENCOUNTER — Other Ambulatory Visit: Payer: Self-pay | Admitting: Family Medicine

## 2012-10-24 ENCOUNTER — Other Ambulatory Visit: Payer: Self-pay | Admitting: Obstetrics and Gynecology

## 2012-10-24 DIAGNOSIS — M949 Disorder of cartilage, unspecified: Secondary | ICD-10-CM

## 2012-10-24 DIAGNOSIS — Z780 Asymptomatic menopausal state: Secondary | ICD-10-CM

## 2012-10-24 DIAGNOSIS — M899 Disorder of bone, unspecified: Secondary | ICD-10-CM

## 2013-02-19 ENCOUNTER — Ambulatory Visit (INDEPENDENT_AMBULATORY_CARE_PROVIDER_SITE_OTHER): Payer: 59 | Admitting: Family Medicine

## 2013-02-19 ENCOUNTER — Encounter: Payer: Self-pay | Admitting: Family Medicine

## 2013-02-19 VITALS — BP 140/72 | Temp 99.3°F

## 2013-02-19 DIAGNOSIS — J209 Acute bronchitis, unspecified: Secondary | ICD-10-CM

## 2013-02-19 MED ORDER — AZITHROMYCIN 250 MG PO TABS: ORAL_TABLET | ORAL | Status: DC

## 2013-02-19 MED ORDER — HYDROCODONE-HOMATROPINE 5-1.5 MG/5ML PO SYRP: 5.0000 mL | ORAL_SOLUTION | Freq: Four times a day (QID) | ORAL | Status: AC | PRN

## 2013-02-19 NOTE — Patient Instructions (Addendum)

## 2013-02-19 NOTE — Progress Notes (Signed)
  Subjective:    Patient ID: Stephanie [name], female    DOB: September 14, 1956, 57 y.o.   MRN: [id number]  HPI  Acute visit Over one week history of bronchial inflammation and coughing. Cough productive of green sputum. No fevers or chills. Minimal nasal congestion. No sore throat. Cough especially bothersome at night. Mild relief with Robitussin Denies hemoptysis, dyspnea, or any appetite or weight changes. Possibly some mild wheezing at night. Ex-smoker. Quit 2011.  Past Medical History  Diagnosis Date  . Hypertension   . Hyperlipidemia    No past surgical history on file.  reports that she quit smoking about 2 years ago. Her smoking use included Cigarettes. She has a 30 pack-year smoking history. She does not have any smokeless tobacco history on file. She reports that  drinks alcohol. Her drug history is not on file. family history includes Alzheimer's disease in her mother; Heart disease (age of onset: 64) in her brother and father; and Sudden death in an unspecified family member. No Known Allergies   Review of Systems As per history of present illness    Objective:   Physical Exam  Constitutional: She appears well-developed and well-nourished.  HENT:  Right Ear: External ear normal.  Left Ear: External ear normal.  Mouth/Throat: Oropharynx is clear and moist.  Neck: Neck supple.  Cardiovascular: Normal rate and regular rhythm.   Pulmonary/Chest: Effort normal and breath sounds normal. No respiratory distress. She has no wheezes. She has no rales.  Lymphadenopathy:    She has no cervical adenopathy.          Assessment & Plan:  Acute bronchitis. Zithromax for 5 days. Hycodan cough syrup as needed for severe cough at night followup if symptoms persist or worsen

## 2013-03-08 ENCOUNTER — Telehealth: Payer: Self-pay | Admitting: Family Medicine

## 2013-03-08 MED ORDER — SIMVASTATIN 40 MG PO TABS: 40.0000 mg | ORAL_TABLET | Freq: Every day | ORAL | Status: DC

## 2013-03-08 NOTE — Telephone Encounter (Signed)
PT is requesting a 30 day supply of her simvastatin (ZOCOR) 40 MG tablet, be sent to [facility] [place] [facility]. Please assist.

## 2013-03-30 ENCOUNTER — Telehealth: Payer: Self-pay | Admitting: Family Medicine

## 2013-03-30 MED ORDER — FLUOXETINE HCL 20 MG PO CAPS: 20.0000 mg | ORAL_CAPSULE | Freq: Every day | ORAL | Status: DC

## 2013-03-30 NOTE — Telephone Encounter (Signed)
PT called and stated that she will also need a 30 day supply of FLUoxetine (PROZAC) 20 MG capsule [facility] to [facility] [place]. She stated that she would like this called in today because she is completely out, and it will take over a week for the mail order to arrive. Please assist.

## 2013-03-30 NOTE — Telephone Encounter (Signed)
PT called to request a 3 month supply of FLUoxetine (PROZAC) 20 MG capsule, be called into [facility]. Please assist.

## 2013-04-10 ENCOUNTER — Telehealth: Payer: Self-pay | Admitting: Family Medicine

## 2013-04-10 NOTE — Telephone Encounter (Signed)
PT called to request a 3 month supply of her FLUoxetine (PROZAC) 20 MG capsule, be sent to [facility]. Please assist.

## 2013-04-10 NOTE — Telephone Encounter (Signed)
Please Advise.... Patient just had a refill #30 no refills on 03/30/13 [facility]

## 2013-04-11 ENCOUNTER — Other Ambulatory Visit: Payer: Self-pay

## 2013-04-11 MED ORDER — FLUOXETINE HCL 20 MG PO CAPS: 20.0000 mg | ORAL_CAPSULE | Freq: Every day | ORAL | Status: DC

## 2013-04-11 NOTE — Telephone Encounter (Signed)
Refill for 6 months. 

## 2013-05-09 ENCOUNTER — Encounter: Payer: Self-pay | Admitting: Family Medicine

## 2013-05-24 ENCOUNTER — Encounter: Payer: Self-pay | Admitting: Family Medicine

## 2013-07-18 ENCOUNTER — Other Ambulatory Visit: Payer: Self-pay | Admitting: Family Medicine

## 2013-07-20 ENCOUNTER — Encounter: Payer: 59 | Admitting: Family Medicine

## 2013-08-30 ENCOUNTER — Other Ambulatory Visit (INDEPENDENT_AMBULATORY_CARE_PROVIDER_SITE_OTHER): Payer: 59

## 2013-08-30 DIAGNOSIS — Z0000 Encounter for general adult medical examination without abnormal findings: Secondary | ICD-10-CM

## 2013-08-30 LAB — CBC WITH DIFFERENTIAL/PLATELET
Basophils Relative: 1 % (ref 0.0–3.0)
Eosinophils Absolute: 0.1 10*3/uL (ref 0.0–0.7)
Eosinophils Relative: 1.7 % (ref 0.0–5.0)
HCT: 41.3 % (ref 36.0–46.0)
Lymphs Abs: 1.4 10*3/uL (ref 0.7–4.0)
MCHC: 34.2 g/dL (ref 30.0–36.0)
MCV: 90.8 fl (ref 78.0–100.0)
Monocytes Absolute: 0.7 10*3/uL (ref 0.1–1.0)
Neutrophils Relative %: 58.5 % (ref 43.0–77.0)
Platelets: 319 10*3/uL (ref 150.0–400.0)

## 2013-08-30 LAB — POCT URINALYSIS DIPSTICK
Ketones, UA: NEGATIVE
Leukocytes, UA: NEGATIVE
Nitrite, UA: NEGATIVE
Protein, UA: NEGATIVE
pH, UA: 7.5

## 2013-08-30 LAB — TSH: TSH: 0.72 u[IU]/mL (ref 0.35–5.50)

## 2013-08-30 LAB — BASIC METABOLIC PANEL
BUN: 11 mg/dL (ref 6–23)
CO2: 29 mEq/L (ref 19–32)
Chloride: 95 mEq/L — ABNORMAL LOW (ref 96–112)
Creatinine, Ser: 0.7 mg/dL (ref 0.4–1.2)
Potassium: 5 mEq/L (ref 3.5–5.1)

## 2013-08-30 LAB — HEPATIC FUNCTION PANEL
ALT: 20 U/L (ref 0–35)
Bilirubin, Direct: 0 mg/dL (ref 0.0–0.3)
Total Bilirubin: 1.1 mg/dL (ref 0.3–1.2)
Total Protein: 7.5 g/dL (ref 6.0–8.3)

## 2013-08-30 LAB — LIPID PANEL: Cholesterol: 249 mg/dL — ABNORMAL HIGH (ref 0–200)

## 2013-09-07 ENCOUNTER — Encounter: Payer: 59 | Admitting: Family Medicine

## 2013-09-11 ENCOUNTER — Ambulatory Visit (INDEPENDENT_AMBULATORY_CARE_PROVIDER_SITE_OTHER): Payer: 59 | Admitting: Family Medicine

## 2013-09-11 ENCOUNTER — Encounter: Payer: Self-pay | Admitting: Family Medicine

## 2013-09-11 VITALS — BP 134/80 | HR 58 | Temp 98.1°F | Wt 188.0 lb

## 2013-09-11 DIAGNOSIS — Z0000 Encounter for general adult medical examination without abnormal findings: Secondary | ICD-10-CM

## 2013-09-11 NOTE — Patient Instructions (Signed)
Let me know if we can help with scheduling colonoscopy.

## 2013-09-11 NOTE — Progress Notes (Signed)
   Subjective:    Patient ID: Stephanie [name], female    DOB: 1956-03-14, 57 y.o.   MRN: [id number]  HPI Patient seen for complete physical. She quit smoking about 3 years ago. She's had some steady weight gain since then. No consistent exercise. She is on lisinopril HCTZ for hypertension and simvastatin for hyperlipidemia. She has not had previous colonoscopy and is not yet ready to schedule. She sees gynecologist gets regular Pap smears and mammograms. She's had previous Pneumovax and has had flu vaccine  Past Medical History  Diagnosis Date  . Hypertension   . Hyperlipidemia    No past surgical history on file.  reports that she quit smoking about 3 years ago. Her smoking use included Cigarettes. She has a 30 pack-year smoking history. She does not have any smokeless tobacco history on file. She reports that she drinks alcohol. Her drug history is not on file. family history includes Alzheimer's disease in her mother; Heart disease (age of onset: 79) in her brother and father; Sudden death in an other family member. No Known Allergies    Review of Systems  Constitutional: Positive for fatigue. Negative for fever, activity change, appetite change and unexpected weight change.  HENT: Negative for ear pain, hearing loss, sore throat and trouble swallowing.   Eyes: Negative for visual disturbance.  Respiratory: Negative for cough and shortness of breath.   Cardiovascular: Negative for chest pain and palpitations.  Gastrointestinal: Negative for abdominal pain, diarrhea, constipation and blood in stool.  Endocrine: Negative for polydipsia and polyuria.  Genitourinary: Negative for dysuria and hematuria.  Musculoskeletal: Negative for arthralgias, back pain and myalgias.  Skin: Negative for rash.  Neurological: Negative for dizziness, syncope and headaches.  Hematological: Negative for adenopathy.  Psychiatric/Behavioral: Negative for confusion and dysphoric mood.       Objective:   Physical Exam  Constitutional: She is oriented to person, place, and time. She appears well-developed and well-nourished.  HENT:  Head: Normocephalic and atraumatic.  Eyes: EOM are normal. Pupils are equal, round, and reactive to light.  Neck: Normal range of motion. Neck supple. No thyromegaly present.  Cardiovascular: Normal rate, regular rhythm and normal heart sounds.   No murmur heard. Pulmonary/Chest: Breath sounds normal. No respiratory distress. She has no wheezes. She has no rales.  Abdominal: Soft. Bowel sounds are normal. She exhibits no distension and no mass. There is no tenderness. There is no rebound and no guarding.  Genitourinary:  Per GYN  Musculoskeletal: Normal range of motion. She exhibits no edema.  Lymphadenopathy:    She has no cervical adenopathy.  Neurological: She is alert and oriented to person, place, and time. She displays normal reflexes. No cranial nerve deficit.  Skin: No rash noted.  Psychiatric: She has a normal mood and affect. Her behavior is normal. Judgment and thought content normal.          Assessment & Plan:  Complete physical. Colonoscopy recommended and she will schedule when she is ready.  We recommend  more consistent exercise and working on weight loss. She'll continue GYN followup. Labs reviewed. We've recommended low saturated fat diet.

## 2013-09-11 NOTE — Progress Notes (Signed)
Pre visit review using our clinic review tool, if applicable. No additional management support is needed unless otherwise documented below in the visit note. 

## 2013-11-04 ENCOUNTER — Other Ambulatory Visit: Payer: Self-pay | Admitting: Family Medicine

## 2013-11-05 ENCOUNTER — Ambulatory Visit (INDEPENDENT_AMBULATORY_CARE_PROVIDER_SITE_OTHER): Payer: 59 | Admitting: Family Medicine

## 2013-11-05 ENCOUNTER — Encounter: Payer: Self-pay | Admitting: Family Medicine

## 2013-11-05 VITALS — BP 130/68 | HR 71 | Temp 98.0°F | Wt 185.0 lb

## 2013-11-05 DIAGNOSIS — J209 Acute bronchitis, unspecified: Secondary | ICD-10-CM

## 2013-11-05 MED ORDER — HYDROCODONE-HOMATROPINE 5-1.5 MG/5ML PO SYRP: 5.0000 mL | ORAL_SOLUTION | Freq: Four times a day (QID) | ORAL | Status: AC | PRN

## 2013-11-05 NOTE — Progress Notes (Signed)
   Subjective:    Patient ID: Stephanie [name], female    DOB: [birth year], 58 y.o.   MRN: [id number]  HPI Acute visit for sore throat and cough. Last Thursday she developed some cough which has been mostly dry. She has not had any fever. She's had some mild body aches and malaise. She's had some nasal congestion.  No dyspnea. No nausea or vomiting. No skin rashes. Minimal headache. She had difficulty sleeping last night. She took over-the-counter cough medication with minimal relief.  Past Medical History  Diagnosis Date  . Hypertension   . Hyperlipidemia    No past surgical history on file.  reports that she quit smoking about 3 years ago. Her smoking use included Cigarettes. She has a 30 pack-year smoking history. She does not have any smokeless tobacco history on file. She reports that she drinks alcohol. Her drug history is not on file. family history includes Alzheimer's disease in her mother; Heart disease (age of onset: 78) in her brother and father; Sudden death in an other family member. No Known Allergies    Review of Systems  Constitutional: Positive for fatigue.  HENT: Positive for congestion and sore throat.   Respiratory: Positive for cough. Negative for shortness of breath and wheezing.   Cardiovascular: Negative for chest pain.       Objective:   Physical Exam  Constitutional: She appears well-developed and well-nourished.  HENT:  Right Ear: External ear normal.  Left Ear: External ear normal.  Mouth/Throat: Oropharynx is clear and moist.  Neck: Neck supple.  Cardiovascular: Normal rate.   Pulmonary/Chest: Effort normal and breath sounds normal. No respiratory distress. She has no wheezes. She has no rales.  Lymphadenopathy:    She has no cervical adenopathy.          Assessment & Plan:  Acute viral syndrome. Hycodan cough syrup for nighttime use as needed. Plenty of fluids. Continue Advil for body aches. Followup as needed

## 2013-11-05 NOTE — Patient Instructions (Signed)
Acute Bronchitis Bronchitis is inflammation of the airways that extend from the windpipe into the lungs (bronchi). The inflammation often causes mucus to develop. This leads to a cough, which is the most common symptom of bronchitis.  In acute bronchitis, the condition usually develops suddenly and goes away over time, usually in a couple weeks. Smoking, allergies, and asthma can make bronchitis worse. Repeated episodes of bronchitis may cause further lung problems.  CAUSES Acute bronchitis is most often caused by the same virus that causes a cold. The virus can spread from person to person (contagious).  SIGNS AND SYMPTOMS   Cough.   Fever.   Coughing up mucus.   Body aches.   Chest congestion.   Chills.   Shortness of breath.   Sore throat.  DIAGNOSIS  Acute bronchitis is usually diagnosed through a physical exam. Tests, such as chest X-rays, are sometimes done to rule out other conditions.  TREATMENT  Acute bronchitis usually goes away in a couple weeks. Often times, no medical treatment is necessary. Medicines are sometimes given for relief of fever or cough. Antibiotics are usually not needed but may be prescribed in certain situations. In some cases, an inhaler may be recommended to help reduce shortness of breath and control the cough. A cool mist vaporizer may also be used to help thin bronchial secretions and make it easier to clear the chest.  HOME CARE INSTRUCTIONS  Get plenty of rest.   Drink enough fluids to keep your urine clear or pale yellow (unless you have a medical condition that requires fluid restriction). Increasing fluids may help thin your secretions and will prevent dehydration.   Only take over-the-counter or prescription medicines as directed by your health care provider.   Avoid smoking and secondhand smoke. Exposure to cigarette smoke or irritating chemicals will make bronchitis worse. If you are a smoker, consider using nicotine gum or skin  patches to help control withdrawal symptoms. Quitting smoking will help your lungs heal faster.   Reduce the chances of another bout of acute bronchitis by washing your hands frequently, avoiding people with cold symptoms, and trying not to touch your hands to your mouth, nose, or eyes.   Follow up with your health care provider as directed.  SEEK MEDICAL CARE IF: Your symptoms do not improve after 1 week of treatment.  SEEK IMMEDIATE MEDICAL CARE IF:  You develop an increased fever or chills.   You have chest pain.   You have severe shortness of breath.  You have bloody sputum.   You develop dehydration.  You develop fainting.  You develop repeated vomiting.  You develop a severe headache. MAKE SURE YOU:   Understand these instructions.  Will watch your condition.  Will get help right away if you are not doing well or get worse. Document Released: 11/04/2004 Document Revised: 05/30/2013 Document Reviewed: 03/20/2013 Exit[employer] Patient Information 2014 [employer].  

## 2013-11-05 NOTE — Progress Notes (Signed)
Pre visit review using our clinic review tool, if applicable. No additional management support is needed unless otherwise documented below in the visit note. 

## 2013-11-08 ENCOUNTER — Telehealth: Payer: Self-pay | Admitting: Family Medicine

## 2013-11-08 MED ORDER — AZITHROMYCIN 250 MG PO TABS: ORAL_TABLET | ORAL | Status: DC

## 2013-11-08 NOTE — Telephone Encounter (Signed)
Patient Information:  Caller Name: Dosha  Phone: [phone]  Patient: Stephanie [name]  Gender: Female  DOB: Jun 05, 1956  Age: 58 Years  PCP: Carolann Littler [facility])  Office Follow Up:  Does the office need to follow up with this patient?: Yes  Instructions For The Office: Patient states worse since office visit. Now with low grade fever. Eye drops (standing order called to pharmacy)  RN Note:  (1) Standing order for Polytrim eye drops 2 drps both eyes QID x5 days, 1 bottle, NR . [phone] [place]. Spoke with pharmacist provided standing order. Care advice reviewed.  (2) Patient states she is no better from office visit on 11/05/13. Now experiencing fever low grade. coughs till near emesis on Hycodan (causes nausea). Requesting Dr. Elease Hashimoto review.  Symptoms  [name] For Call & Symptoms: Patient in the office on Monday 11/05/13 and diagnosed with Vial Syndrome and given Hycodan for cough.  She states she is not better and is having low grade fever.  Last time taken was yesterday 99.7 (o), +sore throat, +coughing non productive, +body aches, +runny nose clear.  Patient states Left eye with drainage and lashes matted shut, sclera pink and irritated, no itching, +burning..  Exposure to pink eye from grandchildren  Reviewed Health History In EMR: Yes  Reviewed Medications In EMR: Yes  Reviewed Allergies In EMR: Yes  Reviewed Surgeries / Procedures: Yes  Date of Onset of Symptoms: 11/05/2013  Treatments Tried: Hycodan , Tussin DM , Ibuprofen  Treatments Tried Worked: Yes  Any Fever: Yes  Fever Taken: Tactile  Fever Time Of Reading: 11:00:00  Fever Last Reading: N/A  Guideline(s) Used:  Eye - Pus or Discharge  Cough  Disposition Per Guideline:   See Within 3 Days in Office  [name] For Disposition Reached:   Taking an ACE Inhibitor medication (e.g., benazepril/LOTENSIN, captopril/CAPOTEN, enalapril/VASOTEC, lisinopril/ZESTRIL)  Advice Given:  Eyelid  Cleansing:   Gently wash eyelids and lashes with warm water and wet cotton balls (or cotton gauze). Remove all the dried and liquid pus.  Do this as often as needed.  Contact Lenses:  Switch to glasses for a short time. This will help stop damage to your eye.  Contagiousness:  Pinkeye is contagious. It is very easy to spread to other people. You can spread it by shaking hands.  Try not to touch your eyes. Wash your hands often. Do not share towels. Try not to touch your eyes. Wash your hands frequently. Do not share towels.  You may return to work or school. Avoid physical contact (e.g., shaking hands) until the symptoms have resolved.  Call Back If  Pus increases in amount  Pus in corner of eye lasts over 3 days  Eyelid becomes red or swollen  You become worse.  Prescription Option for Antibiotic Eyedrops  Per Protocol - [place]:   If PCP approves calling in prescription, do so per protocol.  Prescription: Polytrim (polymyxin-trimethoprim) eyedrops, 5 ml bottle ($12)  Dosing: 1 to 2 drops four times daily for 5 to 7 days. Note: drop covers the adult eye.  Reassurance  Coughing is the way that our lungs remove irritants and mucus. It helps protect our lungs from getting pneumonia.  Cough Medicines:  Home Remedy - Honey: This old home remedy has been shown to help decrease coughing at night. The adult dosage is 2 teaspoons (10 ml) at bedtime. Honey should not be given to infants under one year of age.  Prevent Dehydration:  Drink adequate liquids.  This will help soothe an irritated or dry throat and loosen up the phlegm.  Coughing Spasms:  Drink warm fluids. Inhale warm mist ([name]: both relax the airway and loosen up the phlegm).  Suck on cough drops or hard candy to coat the irritated throat.  Call Back If:  Difficulty breathing  Cough lasts more than 3 weeks  Fever lasts > 3 days  You become worse.  RN Overrode Recommendation:  Patient Requests Prescription  Patient  states worse since office visit. Now with low grade fever. Eye drops (standing order called to pharmacy)

## 2013-11-08 NOTE — Telephone Encounter (Signed)
RX sent to pharmacy, called pt 5x and the line is busy every time.

## 2013-11-08 NOTE — Telephone Encounter (Signed)
Start Zithromax for 5 days (Z-pack) and office follow up if not better by next week.

## 2013-11-20 ENCOUNTER — Telehealth: Payer: Self-pay | Admitting: Family Medicine

## 2013-11-20 NOTE — Telephone Encounter (Signed)
Pt stated that she is feeling ok. She is able to keep water, etc. Pt informed that if she isnt feeling any better to set up appt.

## 2013-11-20 NOTE — Telephone Encounter (Signed)
Patient Information:  Caller Name: Stephanie [name]  Phone: [phone]  Patient: Stephanie [name] Reason  Gender: Female  DOB: [birth year]  Age: 58 Years  PCP: Stephanie [name] [facility])  Office Follow Up:  Does the office need to follow up with this patient?: No  Instructions For The Office: N/A  RN Note:  Gave information about food poisoning, symptoms, treatment.  Symptoms  Reason For Call & Symptoms: Sat 2/7 had shrimp to eat and became violently ill soon afterward with vomiting and diarrhea that continued all night - thinks she had food poisoning.   Stopped earing, but trying to drink fluids.  Vomiting and diarrhea stopped, then mild headache and nausea continues.  Dizziness at times.  Asking how long symptoms might last.  Reviewed Health History In EMR: Yes  Reviewed Medications In EMR: Yes  Reviewed Allergies In EMR: Yes  Reviewed Surgeries / Procedures: Yes  Date of Onset of Symptoms: 11/18/2013  Treatments Tried: stopped eating, drinking fluids  Treatments Tried Worked: No  Guideline(s) Used:  Vomiting  Dizziness  Disposition Per Guideline:   Home Care  Reason For Disposition Reached:   Poor fluid intake probably causing dizziness  Advice Given:  N/A  Patient Will Follow Care Advice:  YES

## 2013-12-15 ENCOUNTER — Other Ambulatory Visit: Payer: Self-pay | Admitting: Family Medicine

## 2014-05-21 ENCOUNTER — Encounter: Payer: Self-pay | Admitting: Family Medicine

## 2014-07-16 ENCOUNTER — Other Ambulatory Visit (INDEPENDENT_AMBULATORY_CARE_PROVIDER_SITE_OTHER): Payer: 59

## 2014-07-16 DIAGNOSIS — Z0000 Encounter for general adult medical examination without abnormal findings: Secondary | ICD-10-CM

## 2014-07-16 LAB — CBC WITH DIFFERENTIAL/PLATELET
BASOS PCT: 1.2 % (ref 0.0–3.0)
Basophils Absolute: 0.1 10*3/uL (ref 0.0–0.1)
EOS PCT: 3.8 % (ref 0.0–5.0)
Eosinophils Absolute: 0.2 10*3/uL (ref 0.0–0.7)
HCT: 39.2 % (ref 36.0–46.0)
Hemoglobin: 13.6 g/dL (ref 12.0–15.0)
Lymphocytes Relative: 28 % (ref 12.0–46.0)
Lymphs Abs: 1.2 10*3/uL (ref 0.7–4.0)
MCHC: 34.8 g/dL (ref 30.0–36.0)
MCV: 90.2 fl (ref 78.0–100.0)
Monocytes Absolute: 0.5 10*3/uL (ref 0.1–1.0)
Monocytes Relative: 11.2 % (ref 3.0–12.0)
NEUTROS PCT: 55.8 % (ref 43.0–77.0)
Neutro Abs: 2.5 10*3/uL (ref 1.4–7.7)
Platelets: 333 10*3/uL (ref 150.0–400.0)
RBC: 4.34 Mil/uL (ref 3.87–5.11)
RDW: 13 % (ref 11.5–15.5)
WBC: 4.4 10*3/uL (ref 4.0–10.5)

## 2014-07-16 LAB — POCT URINALYSIS DIPSTICK
Bilirubin, UA: NEGATIVE
Glucose, UA: NEGATIVE
Ketones, UA: NEGATIVE
Leukocytes, UA: NEGATIVE
Nitrite, UA: NEGATIVE
PH UA: 6.5
PROTEIN UA: NEGATIVE
RBC UA: NEGATIVE
UROBILINOGEN UA: 0.2

## 2014-07-17 LAB — BASIC METABOLIC PANEL
BUN: 13 mg/dL (ref 6–23)
CALCIUM: 9.5 mg/dL (ref 8.4–10.5)
CO2: 23 mEq/L (ref 19–32)
CREATININE: 0.6 mg/dL (ref 0.4–1.2)
Chloride: 100 mEq/L (ref 96–112)
GFR: 102.52 mL/min (ref >60.00)
Glucose, Bld: 83 mg/dL (ref 70–99)
Potassium: 5.1 mEq/L (ref 3.5–5.1)
Sodium: 134 mEq/L — ABNORMAL LOW (ref 135–145)

## 2014-07-17 LAB — LIPID PANEL
CHOL/HDL RATIO: 3
Cholesterol: 250 mg/dL — ABNORMAL HIGH (ref 0–200)
HDL: 80.9 mg/dL (ref >39.00)
LDL CALC: 157 mg/dL — AB (ref 0–99)
NonHDL: 169.1
Triglycerides: 63 mg/dL (ref 0.0–149.0)
VLDL: 12.6 mg/dL (ref 0.0–40.0)

## 2014-07-17 LAB — HEPATIC FUNCTION PANEL
ALK PHOS: 69 U/L (ref 39–117)
ALT: 18 U/L (ref 0–35)
AST: 19 U/L (ref 0–37)
Albumin: 4.2 g/dL (ref 3.5–5.2)
BILIRUBIN TOTAL: 0.7 mg/dL (ref 0.2–1.2)
Bilirubin, Direct: 0.1 mg/dL (ref 0.0–0.3)
TOTAL PROTEIN: 7.2 g/dL (ref 6.0–8.3)

## 2014-07-17 LAB — TSH: TSH: 1.89 u[IU]/mL (ref 0.35–4.50)

## 2014-07-24 ENCOUNTER — Encounter: Payer: Self-pay | Admitting: Family Medicine

## 2014-07-24 ENCOUNTER — Ambulatory Visit (INDEPENDENT_AMBULATORY_CARE_PROVIDER_SITE_OTHER): Payer: 59 | Admitting: Family Medicine

## 2014-07-24 VITALS — BP 128/70 | HR 68 | Temp 98.1°F | Ht 67.0 in | Wt 181.0 lb

## 2014-07-24 DIAGNOSIS — Z0000 Encounter for general adult medical examination without abnormal findings: Secondary | ICD-10-CM

## 2014-07-24 MED ORDER — LORAZEPAM 0.5 MG PO TABS: 0.5000 mg | ORAL_TABLET | Freq: Three times a day (TID) | ORAL | Status: DC | PRN

## 2014-07-24 NOTE — Patient Instructions (Signed)
Return for shingles vaccine at age 58.

## 2014-07-24 NOTE — Progress Notes (Signed)
Pre visit review using our clinic review tool, if applicable. No additional management support is needed unless otherwise documented below in the visit note. 

## 2014-07-24 NOTE — Progress Notes (Signed)
   Subjective:    Patient ID: Stephanie [name], female    DOB: 09-13-1956, 58 y.o.   MRN: [id number]  HPI Patient here for complete physical. She sees gynecologist regularly. She has declined colonoscopy screening. She plans to get shingles vaccine at age 51. She walks some for exercise. She has hyperlipidemia treated with simvastatin and hypertension treated with lisinopril HCTZ. Blood pressure stable. No orthostasis. No recent chest pains.  She takes fluoxetine for chronic anxiety.  Past Medical History  Diagnosis Date  . Hypertension   . Hyperlipidemia    No past surgical history on file.  reports that she quit smoking about 4 years ago. Her smoking use included Cigarettes. She has a 30 pack-year smoking history. She does not have any smokeless tobacco history on file. She reports that she drinks alcohol. Her drug history is not on file. family history includes Alzheimer's disease in her mother; Heart disease (age of onset: 48) in her brother and father; Sudden death in an other family member. No Known Allergies    Review of Systems  Constitutional: Negative for fever, activity change, appetite change, fatigue and unexpected weight change.  HENT: Negative for ear pain, hearing loss, sore throat and trouble swallowing.   Eyes: Negative for visual disturbance.  Respiratory: Negative for cough and shortness of breath.   Cardiovascular: Negative for chest pain and palpitations.  Gastrointestinal: Negative for abdominal pain, diarrhea, constipation and blood in stool.  Genitourinary: Negative for dysuria and hematuria.  Musculoskeletal: Negative for arthralgias, back pain and myalgias.  Skin: Negative for rash.  Neurological: Negative for dizziness, syncope and headaches.  Hematological: Negative for adenopathy.  Psychiatric/Behavioral: Negative for confusion and dysphoric mood.       Objective:   Physical Exam  Constitutional: She is oriented to person, place, and time. She appears  well-developed and well-nourished.  HENT:  Head: Normocephalic and atraumatic.  Eyes: EOM are normal. Pupils are equal, round, and reactive to light.  Neck: Normal range of motion. Neck supple. No thyromegaly present.  Cardiovascular: Normal rate, regular rhythm and normal heart sounds.   No murmur heard. Pulmonary/Chest: Breath sounds normal. No respiratory distress. She has no wheezes. She has no rales.  Abdominal: Soft. Bowel sounds are normal. She exhibits no distension and no mass. There is no tenderness. There is no rebound and no guarding.  Genitourinary:  Per gyn  Musculoskeletal: Normal range of motion. She exhibits no edema.  Lymphadenopathy:    She has no cervical adenopathy.  Neurological: She is alert and oriented to person, place, and time. She displays normal reflexes. No cranial nerve deficit.  Skin: No rash noted.  Psychiatric: She has a normal mood and affect. Her behavior is normal. Judgment and thought content normal.          Assessment & Plan:  Complete physical. Labs reviewed. She has persistent elevation in lipids treated currently with simvastatin. We discussed possible change to Lipitor or Crestor (or addition of Zetia) and she declines. She declines colonoscopy this time. She'll continue GYN followup. Tetanus up-to-date. Patient requesting very few lorazepam for severe anxiety. She uses extremely limited in the past for situations such as flying. Limited number lorazepam 0.5 mg #30 with no refill one every 6 hours when necessary

## 2014-09-16 ENCOUNTER — Telehealth: Payer: Self-pay | Admitting: Family Medicine

## 2014-09-16 MED ORDER — SIMVASTATIN 40 MG PO TABS: ORAL_TABLET | ORAL | Status: DC

## 2014-09-16 NOTE — Telephone Encounter (Signed)
Pt request refill simvastatin (ZOCOR) 40 MG tablet 90 day [facility] to [facility]  However pt is out and asked that a 30 day be sent to  [facility]

## 2014-09-16 NOTE — Telephone Encounter (Signed)
Rx sent to mail order and local pharmacy. 

## 2014-10-29 ENCOUNTER — Encounter: Payer: Self-pay | Admitting: Family Medicine

## 2014-10-29 MED ORDER — FLUOXETINE HCL 20 MG PO CAPS: ORAL_CAPSULE | ORAL | Status: DC

## 2014-10-29 NOTE — Telephone Encounter (Signed)
Rx for Prozac sent to pharmacy

## 2014-12-30 ENCOUNTER — Other Ambulatory Visit: Payer: Self-pay | Admitting: Family Medicine

## 2015-06-17 ENCOUNTER — Encounter: Payer: Self-pay | Admitting: Family Medicine

## 2015-07-17 ENCOUNTER — Other Ambulatory Visit: Payer: Self-pay | Admitting: Family Medicine

## 2015-07-23 ENCOUNTER — Other Ambulatory Visit (INDEPENDENT_AMBULATORY_CARE_PROVIDER_SITE_OTHER): Payer: 59

## 2015-07-23 DIAGNOSIS — Z0000 Encounter for general adult medical examination without abnormal findings: Secondary | ICD-10-CM | POA: Diagnosis not present

## 2015-07-23 LAB — CBC WITH DIFFERENTIAL/PLATELET
BASOS ABS: 0 10*3/uL (ref 0.0–0.1)
Basophils Relative: 0.8 % (ref 0.0–3.0)
EOS ABS: 0.1 10*3/uL (ref 0.0–0.7)
Eosinophils Relative: 2.1 % (ref 0.0–5.0)
HEMATOCRIT: 41 % (ref 36.0–46.0)
HEMOGLOBIN: 13.4 g/dL (ref 12.0–15.0)
LYMPHS PCT: 29.5 % (ref 12.0–46.0)
Lymphs Abs: 1.3 10*3/uL (ref 0.7–4.0)
MCHC: 32.8 g/dL (ref 30.0–36.0)
MCV: 93 fl (ref 78.0–100.0)
MONOS PCT: 11 % (ref 3.0–12.0)
Monocytes Absolute: 0.5 10*3/uL (ref 0.1–1.0)
NEUTROS ABS: 2.5 10*3/uL (ref 1.4–7.7)
Neutrophils Relative %: 56.6 % (ref 43.0–77.0)
Platelets: 338 10*3/uL (ref 150.0–400.0)
RBC: 4.41 Mil/uL (ref 3.87–5.11)
RDW: 12.6 % (ref 11.5–15.5)
WBC: 4.4 10*3/uL (ref 4.0–10.5)

## 2015-07-23 LAB — LIPID PANEL
CHOL/HDL RATIO: 3
Cholesterol: 253 mg/dL — ABNORMAL HIGH (ref 0–200)
HDL: 88.9 mg/dL (ref >39.00)
LDL CALC: 149 mg/dL — AB (ref 0–99)
NONHDL: 163.77
Triglycerides: 75 mg/dL (ref 0.0–149.0)
VLDL: 15 mg/dL (ref 0.0–40.0)

## 2015-07-23 LAB — HEPATIC FUNCTION PANEL
ALBUMIN: 4.2 g/dL (ref 3.5–5.2)
ALK PHOS: 78 U/L (ref 39–117)
ALT: 15 U/L (ref 0–35)
AST: 17 U/L (ref 0–37)
BILIRUBIN DIRECT: 0.1 mg/dL (ref 0.0–0.3)
TOTAL PROTEIN: 6.7 g/dL (ref 6.0–8.3)
Total Bilirubin: 0.9 mg/dL (ref 0.2–1.2)

## 2015-07-23 LAB — BASIC METABOLIC PANEL
BUN: 10 mg/dL (ref 6–23)
CALCIUM: 9.9 mg/dL (ref 8.4–10.5)
CO2: 30 meq/L (ref 19–32)
CREATININE: 0.64 mg/dL (ref 0.40–1.20)
Chloride: 96 mEq/L (ref 96–112)
GFR: 100.33 mL/min (ref >60.00)
Glucose, Bld: 89 mg/dL (ref 70–99)
Potassium: 5.3 mEq/L — ABNORMAL HIGH (ref 3.5–5.1)
SODIUM: 133 meq/L — AB (ref 135–145)

## 2015-07-23 LAB — TSH: TSH: 1.25 u[IU]/mL (ref 0.35–4.50)

## 2015-07-30 ENCOUNTER — Ambulatory Visit (INDEPENDENT_AMBULATORY_CARE_PROVIDER_SITE_OTHER): Payer: 59 | Admitting: Family Medicine

## 2015-07-30 ENCOUNTER — Encounter: Payer: Self-pay | Admitting: Family Medicine

## 2015-07-30 VITALS — BP 120/70 | HR 79 | Temp 98.5°F | Ht 67.32 in | Wt 185.9 lb

## 2015-07-30 DIAGNOSIS — Z0000 Encounter for general adult medical examination without abnormal findings: Secondary | ICD-10-CM

## 2015-07-30 DIAGNOSIS — H539 Unspecified visual disturbance: Secondary | ICD-10-CM

## 2015-07-30 MED ORDER — CLOTRIMAZOLE-BETAMETHASONE 1-0.05 % EX CREA: 1.0000 "application " | TOPICAL_CREAM | Freq: Two times a day (BID) | CUTANEOUS | Status: DC

## 2015-07-30 NOTE — Progress Notes (Signed)
Pre visit review using our clinic review tool, if applicable. No additional management support is needed unless otherwise documented below in the visit note. 

## 2015-07-30 NOTE — Progress Notes (Signed)
Subjective:    Patient ID: Stephanie [name], female    DOB: 11/15/1955, 59 y.o.   MRN: [id number]  HPI Patient seen for complete physical. She sees gynecologist but has not had a Pap smear in a couple years. She plans to set up GYN follow-up soon. She gets regular mammograms. She's never had a colonoscopy and declines at this time. She may reconsider later this year. She has history of hyperlipidemia, hypertension, chronic anxiety. Quit smoking 2011.  She's had some atypical very transient stabbing type headaches for about 8 or 9 years now. She has seen neurologist previously with no clear etiology. She states she had episode this past Sunday of transient blurred vision right eye which only last about 1 minute. She did not have any concomitant headache or any speech changes or any focal weakness.  Past Medical History  Diagnosis Date  . Hypertension   . Hyperlipidemia    No past surgical history on file.  reports that she quit smoking about 5 years ago. Her smoking use included Cigarettes. She has a 30 pack-year smoking history. She does not have any smokeless tobacco history on file. She reports that she drinks alcohol. Her drug history is not on file. family history includes Alzheimer's disease in her mother; Heart disease (age of onset: 98) in her brother and father; Sudden death in an other family member. No Known Allergies    Review of Systems  Constitutional: Negative for fever, activity change, appetite change, fatigue and unexpected weight change.  HENT: Negative for ear pain, hearing loss, sore throat and trouble swallowing.   Eyes: Positive for visual disturbance. Negative for photophobia, pain, discharge, redness and itching.  Respiratory: Negative for cough and shortness of breath.   Cardiovascular: Negative for chest pain and palpitations.  Gastrointestinal: Negative for abdominal pain, diarrhea, constipation and blood in stool.  Endocrine: Negative for polydipsia and  polyuria.  Genitourinary: Negative for dysuria and hematuria.  Musculoskeletal: Negative for myalgias, back pain and arthralgias.  Skin: Negative for rash.  Neurological: Negative for dizziness, syncope and headaches.  Hematological: Negative for adenopathy.  Psychiatric/Behavioral: Negative for confusion and dysphoric mood.       Objective:   Physical Exam  Constitutional: She is oriented to person, place, and time. She appears well-developed and well-nourished.  HENT:  Head: Normocephalic and atraumatic.  Eyes: EOM are normal. Pupils are equal, round, and reactive to light.  Neck: Normal range of motion. Neck supple. No thyromegaly present.  No carotid bruit  Cardiovascular: Normal rate, regular rhythm and normal heart sounds.   No murmur heard. Pulmonary/Chest: Breath sounds normal. No respiratory distress. She has no wheezes. She has no rales.  Abdominal: Soft. Bowel sounds are normal. She exhibits no distension and no mass. There is no tenderness. There is no rebound and no guarding.  Musculoskeletal: Normal range of motion. She exhibits no edema.  Lymphadenopathy:    She has no cervical adenopathy.  Neurological: She is alert and oriented to person, place, and time. She displays normal reflexes. No cranial nerve deficit.  Skin:  Angular cheilitis-right corner of mouth  Psychiatric: She has a normal mood and affect. Her behavior is normal. Judgment and thought content normal.          Assessment & Plan:  Complete physical. Flu vaccine already given. We recommended colonoscopy and she will consider at some point later this year. She is encouraged to exercise more consistently. Tetanus up-to-date  Transient blurred vision right eye a few days  ago. She did not have any other worrisome symptoms. This only lasted 1 minute. Doubt TIA. Set up carotid Dopplers. She does not have any carotid bruits.  Did not describe any other worrisome symptoms such as speech change, facial  weakness, or extremity weakness

## 2015-08-07 ENCOUNTER — Encounter: Payer: Self-pay | Admitting: Family Medicine

## 2015-08-14 ENCOUNTER — Ambulatory Visit (INDEPENDENT_AMBULATORY_CARE_PROVIDER_SITE_OTHER): Payer: 59 | Admitting: Family Medicine

## 2015-08-14 ENCOUNTER — Encounter: Payer: Self-pay | Admitting: Family Medicine

## 2015-08-14 VITALS — BP 124/80 | HR 69 | Temp 98.6°F | Ht 67.32 in | Wt 184.5 lb

## 2015-08-14 DIAGNOSIS — R300 Dysuria: Secondary | ICD-10-CM

## 2015-08-14 LAB — POCT URINALYSIS DIPSTICK
BILIRUBIN UA: NEGATIVE
GLUCOSE UA: NEGATIVE
Ketones, UA: NEGATIVE
NITRITE UA: NEGATIVE
Protein, UA: NEGATIVE
Spec Grav, UA: 1.01
UROBILINOGEN UA: 0.2
pH, UA: 6

## 2015-08-14 MED ORDER — NITROFURANTOIN MONOHYD MACRO 100 MG PO CAPS: 100.0000 mg | ORAL_CAPSULE | Freq: Two times a day (BID) | ORAL | Status: DC

## 2015-08-14 NOTE — Progress Notes (Signed)
HPI:  Acute visit for:  Dysuria: -started yesterday -symptoms: frequency, urgency, hematuria x1 -denies: fevers, malaise, chills, flank pain, vaginal discharge or bleeding, NVD  ROS: See pertinent positives and negatives per HPI.  Past Medical History  Diagnosis Date  . Hypertension   . Hyperlipidemia     No past surgical history on file.  Family History  Problem Relation Age of Onset  . Heart disease Father 38    CAD  . Sudden death      family hx  . Alzheimer's disease Mother   . Heart disease Brother 60    CAD    Social History   Social History  . Marital Status: Single    Spouse Name: N/A  . Number of Children: N/A  . Years of Education: N/A   Social History Main Topics  . Smoking status: Former Smoker -- 1.00 packs/day for 30 years    Types: Cigarettes    Quit date: 05/31/2010  . Smokeless tobacco: None  . Alcohol Use: Yes  . Drug Use: None  . Sexual Activity: Not Asked   Other Topics Concern  . None   Social History Narrative     Current outpatient prescriptions:  .  aspirin 81 MG tablet, Take 81 mg by mouth daily.  , Disp: , Rfl:  .  cholecalciferol (VITAMIN D) 1000 UNITS tablet, Take 1,000 Units by mouth daily.  , Disp: , Rfl:  .  clotrimazole-betamethasone (LOTRISONE) cream, Apply 1 application topically 2 (two) times daily., Disp: 30 g, Rfl: 0 .  fish oil-omega-3 fatty acids 1000 MG capsule, Take 2 g by mouth daily.  , Disp: , Rfl:  .  FLUoxetine (PROZAC) 20 MG capsule, Take 1 capsule by mouth  daily, Disp: 90 capsule, Rfl: 2 .  lisinopril-hydrochlorothiazide (PRINZIDE,ZESTORETIC) 20-12.5 MG tablet, TAKE ONE TABLET BY MOUTH ONCE DAILY, Disp: 90 tablet, Rfl: 2 .  simvastatin (ZOCOR) 40 MG tablet, Take 1 tablet by mouth at  bedtime, Disp: 30 tablet, Rfl: 0 .  nitrofurantoin, macrocrystal-monohydrate, (MACROBID) 100 MG capsule, Take 1 capsule (100 mg total) by mouth 2 (two) times daily., Disp: 14 capsule, Rfl: 0  EXAM:  Filed Vitals:   08/14/15 1040  BP: 124/80  Pulse: 69  Temp: 98.6 F (37 C)    Body mass index is 28.62 kg/(m^2).  GENERAL: vitals reviewed and listed above, alert, oriented, appears well hydrated and in no acute distress  HEENT: atraumatic, conjunttiva clear, no obvious abnormalities on inspection of external nose and ears  NECK: no obvious masses on inspection  LUNGS: clear to auscultation bilaterally, no wheezes, rales or rhonchi, good air movement  CV: HRRR, no peripheral edema  ABD: BS+, soft, NTTP, no CVA TTP  MS: moves all extremities without noticeable abnormality  PSYCH: pleasant and cooperative, no obvious depression or anxiety  ASSESSMENT AND PLAN:  Discussed the following assessment and plan:  Dysuria - Plan: POC Urinalysis Dipstick  -we discussed possible serious and likely etiologies, workup and treatment, treatment risks and return precautions - UTI likely given udip and symptoms -after this discussion, Stephanie [name] opted to start abx and check culture -noted she is out of date on gyn exam and she agreed to schedule with her gynecologist -of course, we advised Stephanie [name]  to return or notify a doctor immediately if symptoms worsen or persist or new concerns arise.  .  -Patient advised to return or notify a doctor immediately if symptoms worsen or persist or new concerns arise.  There are no Patient Instructions  on file for this visit.   Colin Benton R.

## 2015-08-14 NOTE — Progress Notes (Signed)
Pre visit review using our clinic review tool, if applicable. No additional management support is needed unless otherwise documented below in the visit note. 

## 2015-08-14 NOTE — Addendum Note (Signed)
Addended by: Agnes Lawrence on: 08/14/2015 01:18 PM   Modules accepted: Orders

## 2015-08-15 ENCOUNTER — Ambulatory Visit (HOSPITAL_COMMUNITY)
Admission: RE | Admit: 2015-08-15 | Discharge: 2015-08-15 | Disposition: A | Discharge status: Home / Self Care | Payer: 59 | Source: Ambulatory Visit | Attending: Cardiovascular Disease | Admitting: Cardiovascular Disease

## 2015-08-15 DIAGNOSIS — H539 Unspecified visual disturbance: Secondary | ICD-10-CM

## 2015-08-17 LAB — URINE CULTURE

## 2015-08-18 ENCOUNTER — Encounter: Payer: Self-pay | Admitting: Family Medicine

## 2015-08-20 ENCOUNTER — Encounter (HOSPITAL_COMMUNITY): Payer: 59

## 2015-08-20 ENCOUNTER — Other Ambulatory Visit: Payer: Self-pay | Admitting: Family Medicine

## 2015-08-20 DIAGNOSIS — H539 Unspecified visual disturbance: Secondary | ICD-10-CM

## 2015-08-20 NOTE — Telephone Encounter (Signed)
Left message for patient to return call on work line and cell phone. Wanting to place order but needing to confirm where patient would like to go for doppler. Awaiting call back.

## 2015-08-20 NOTE — Telephone Encounter (Signed)
Patient has been scheduled for Friday, November 11th at 3:30pm at [facility]. Patient is aware. Confirmed there is no further needs I can accommodate and to call office if she needs anything else.

## 2015-08-22 ENCOUNTER — Ambulatory Visit
Admission: RE | Admit: 2015-08-22 | Discharge: 2015-08-22 | Disposition: A | Discharge status: Home / Self Care | Payer: 59 | Source: Ambulatory Visit | Attending: Family Medicine | Admitting: Family Medicine

## 2015-08-22 DIAGNOSIS — H539 Unspecified visual disturbance: Secondary | ICD-10-CM

## 2015-08-29 ENCOUNTER — Other Ambulatory Visit: Payer: Self-pay | Admitting: Family Medicine

## 2015-10-31 ENCOUNTER — Encounter: Payer: Self-pay | Admitting: Family Medicine

## 2015-10-31 ENCOUNTER — Ambulatory Visit (INDEPENDENT_AMBULATORY_CARE_PROVIDER_SITE_OTHER): Payer: 59 | Admitting: Family Medicine

## 2015-10-31 ENCOUNTER — Other Ambulatory Visit: Payer: Self-pay | Admitting: Family Medicine

## 2015-10-31 VITALS — BP 114/80 | HR 71 | Temp 98.6°F | Ht 67.0 in | Wt 184.4 lb

## 2015-10-31 DIAGNOSIS — R8290 Unspecified abnormal findings in urine: Secondary | ICD-10-CM | POA: Diagnosis not present

## 2015-10-31 DIAGNOSIS — R300 Dysuria: Secondary | ICD-10-CM | POA: Diagnosis not present

## 2015-10-31 LAB — POCT URINALYSIS DIPSTICK
BILIRUBIN UA: NEGATIVE
GLUCOSE UA: NEGATIVE
Ketones, UA: NEGATIVE
NITRITE UA: NEGATIVE
Protein, UA: NEGATIVE
Spec Grav, UA: <=1.005
Urobilinogen, UA: 0.2
pH, UA: 6.5

## 2015-10-31 MED ORDER — NITROFURANTOIN MONOHYD MACRO 100 MG PO CAPS: 100.0000 mg | ORAL_CAPSULE | Freq: Two times a day (BID) | ORAL | Status: DC

## 2015-10-31 NOTE — Progress Notes (Signed)
Pre visit review using our clinic review tool, if applicable. No additional management support is needed unless otherwise documented below in the visit note. 

## 2015-10-31 NOTE — Progress Notes (Signed)
   Subjective:    Patient ID: Stephanie [name], female    DOB: Feb 26, 1956, 60 y.o.   MRN: [id number]  HPI  Patient seen for acute visit. Onset about 3 days ago of urine frequency and burning with urination. Early November she presented with similar symptoms and urine culture grew out Citrobacter koseri and she was treated with Macrobid and symptoms fully resolved. She has not had any gross hematuria. No back pain. No fevers or chills. Does not have history of frequent UTI.  Past Medical History  Diagnosis Date  . Hypertension   . Hyperlipidemia    No past surgical history on file.  reports that she quit smoking about 5 years ago. Her smoking use included Cigarettes. She has a 30 pack-year smoking history. She does not have any smokeless tobacco history on file. She reports that she drinks alcohol. Her drug history is not on file. family history includes Alzheimer's disease in her mother; Heart disease (age of onset: 51) in her brother and father. No Known Allergies   Review of Systems  Constitutional: Negative for fever and chills.  Gastrointestinal: Negative for nausea, vomiting and abdominal pain.  Genitourinary: Positive for dysuria and frequency. Negative for hematuria and flank pain.  Musculoskeletal: Negative for back pain.       Objective:   Physical Exam  Constitutional: She appears well-developed and well-nourished.  HENT:  Head: Normocephalic and atraumatic.  Neck: Neck supple. No thyromegaly present.  Cardiovascular: Normal rate, regular rhythm and normal heart sounds.   Pulmonary/Chest: Breath sounds normal.  Abdominal: Soft. Bowel sounds are normal. There is no tenderness.          Assessment & Plan:  Dysuria. Suspect recurrent UTI. Urine culture sent. Macrobid 1 twice a day for 5 days. Follow-up when necessary

## 2015-10-31 NOTE — Patient Instructions (Signed)

## 2015-11-03 LAB — URINE CULTURE: Colony Count: 75000

## 2015-11-30 ENCOUNTER — Other Ambulatory Visit: Payer: Self-pay | Admitting: Family Medicine

## 2015-12-08 ENCOUNTER — Encounter: Payer: Self-pay | Admitting: Family Medicine

## 2015-12-10 ENCOUNTER — Telehealth: Payer: Self-pay | Admitting: Family Medicine

## 2015-12-10 NOTE — Telephone Encounter (Signed)
[facility] [place] [facility]  Patient Name: Stephanie [name]  DOB: 11/06/1955    Initial Comment Caller states last Fri heart began racing 184-RN dtr adv is high; Previous time adv him of it and he didn't seem concerned; NO SX NOW. Caller states MD wants to see her. *secondary number only for today   Nurse Assessment  Nurse: Wayne Sever, RN, Tillie Rung Date/Time Eilene Ghazi Time): 12/10/2015 10:01:21 AM  Confirm and document reason for call. If symptomatic, describe symptoms. You must click the next button to save text entered. ---Caller states she had an episode of her heart racing last Friday evening. She states she has occasional episodes of this. Denies any symptoms right now. Heart rate was 184 bpm when it happened per caller, she states it lasted 15-20 minutes  Has the patient traveled out of the country within the last 30 days? ---Not Applicable  Does the patient have any new or worsening symptoms? ---Yes  Will a triage be completed? ---Yes  Related visit to physician within the last 2 weeks? ---No  Does the PT have any chronic conditions? (i.e. diabetes, asthma, etc.) ---Yes  List chronic conditions. ---HTN, High Cholesterol,  Is this a behavioral health or substance abuse call? ---No     Guidelines    Guideline Title Affirmed Question Affirmed Notes  Heart Rate and Heartbeat Questions [1] Heart beating very rapidly (e.g., > 140 / minute) AND [2] not present now (Exception: during exercise)    Final Disposition User   See Physician within 4 Hours (or PCP triage) Wayne Sever, RN, Tillie Rung    Comments  No appointments left for today. Caller is going to an Urgent Care, but unsure of which one   Referrals  GO TO FACILITY UNDECIDED   Disagree/Comply: Comply

## 2015-12-10 NOTE — Telephone Encounter (Signed)
Caller states she is going to Urgent Care - will keep open to check on status of patient & will route to CMA for further follow up if needed.

## 2015-12-11 ENCOUNTER — Ambulatory Visit: Payer: 59 | Admitting: Family Medicine

## 2015-12-17 ENCOUNTER — Ambulatory Visit: Payer: Self-pay | Admitting: Family Medicine

## 2015-12-18 ENCOUNTER — Ambulatory Visit (INDEPENDENT_AMBULATORY_CARE_PROVIDER_SITE_OTHER): Payer: 59 | Admitting: Family Medicine

## 2015-12-18 VITALS — BP 162/86 | HR 71 | Temp 98.3°F | Ht 67.0 in | Wt 184.0 lb

## 2015-12-18 DIAGNOSIS — I10 Essential (primary) hypertension: Secondary | ICD-10-CM

## 2015-12-18 DIAGNOSIS — R002 Palpitations: Secondary | ICD-10-CM

## 2015-12-18 NOTE — Patient Instructions (Signed)
Palpitations A palpitation is the feeling that your heartbeat is irregular or is faster than normal. It may feel like your heart is fluttering or skipping a beat. Palpitations are usually not a serious problem. However, in some cases, you may need further medical evaluation. CAUSES  Palpitations can be caused by:  Smoking.  Caffeine or other stimulants, such as diet pills or energy drinks.  Alcohol.  Stress and anxiety.  Strenuous physical activity.  Fatigue.  Certain medicines.  Heart disease, especially if you have a history of irregular heart rhythms (arrhythmias), such as atrial fibrillation, atrial flutter, or supraventricular tachycardia.  An improperly working pacemaker or defibrillator. DIAGNOSIS  To find the cause of your palpitations, your health care provider will take your medical history and perform a physical exam. Your health care provider may also have you take a test called an ambulatory electrocardiogram (ECG). An ECG records your heartbeat patterns over a 24-hour period. You may also have other tests, such as:  Transthoracic echocardiogram (TTE). During echocardiography, sound waves are used to evaluate how blood flows through your heart.  Transesophageal echocardiogram (TEE).  Cardiac monitoring. This allows your health care provider to monitor your heart rate and rhythm in real time.  Holter monitor. This is a portable device that records your heartbeat and can help diagnose heart arrhythmias. It allows your health care provider to track your heart activity for several days, if needed.  Stress tests by exercise or by giving medicine that makes the heart beat faster. TREATMENT  Treatment of palpitations depends on the cause of your symptoms and can vary greatly. Most cases of palpitations do not require any treatment other than time, relaxation, and monitoring your symptoms. Other causes, such as atrial fibrillation, atrial flutter, or supraventricular  tachycardia, usually require further treatment. HOME CARE INSTRUCTIONS   Avoid:  Caffeinated coffee, tea, soft drinks, diet pills, and energy drinks.  Chocolate.  Alcohol.  Stop smoking if you smoke.  Reduce your stress and anxiety. Things that can help you relax include:  A method of controlling things in your body, such as your heartbeats, with your mind (biofeedback).  Yoga.  Meditation.  Physical activity such as swimming, jogging, or walking.  Get plenty of rest and sleep. SEEK MEDICAL CARE IF:   You continue to have a fast or irregular heartbeat beyond 24 hours.  Your palpitations occur more often. SEEK IMMEDIATE MEDICAL CARE IF:  You have chest pain or shortness of breath.  You have a severe headache.  You feel dizzy or you faint. MAKE SURE YOU:  Understand these instructions.  Will watch your condition.  Will get help right away if you are not doing well or get worse.   This information is not intended to replace advice given to you by your health care provider. Make sure you discuss any questions you have with your health care provider.   Document Released: 09/24/2000 Document Revised: 10/02/2013 Document Reviewed: 11/26/2011 Elsevier Interactive Patient Education [employer].  We will set up cardiac event monitor and ECHO Try to gradually scale back on caffeine Monitor blood pressure and be in touch if consistently > 160/100

## 2015-12-18 NOTE — Progress Notes (Signed)
   Subjective:    Patient ID: Stephanie [name], female    DOB: 26-Jul-1956, 60 y.o.   MRN: [id number]  HPI Patient seen with complaint of intermittent palpitations and tachycardia. She states she has generally about 2-3 episodes per month of palpitations By her home blood pressure monitor heart rate is about 180-190. Episode use with last about 30 minutes. She's never had associated dizziness or chest pains. No syncope. No clear provoking factors for palpitations and no alleviating factors.  She went urgent care last week and reportedly had normal EKG. Thyroid studies were normal. Patient does feel anxious frequently. She drinks significant caffeine in terms of diet Coke. She's not had any recent exertional chest pains. No cardiac history.  Risk factors for CAD include dyslipidemia, hypertension, and nicotine use.  Past Medical History  Diagnosis Date  . Hypertension   . Hyperlipidemia    No past surgical history on file.  reports that she quit smoking about 5 years ago. Her smoking use included Cigarettes. She has a 30 pack-year smoking history. She does not have any smokeless tobacco history on file. She reports that she drinks alcohol. Her drug history is not on file. family history includes Alzheimer's disease in her mother; Heart disease (age of onset: 47) in her brother and father. No Known Allergies    Review of Systems  Constitutional: Negative for fatigue.  Eyes: Negative for visual disturbance.  Respiratory: Negative for cough, chest tightness, shortness of breath and wheezing.   Cardiovascular: Positive for palpitations. Negative for chest pain and leg swelling.  Gastrointestinal: Negative for abdominal pain.  Neurological: Negative for dizziness, seizures, syncope, weakness, light-headedness and headaches.       Objective:   Physical Exam  Constitutional: She appears well-developed and well-nourished.  Neck: Neck supple. No thyromegaly present.  Cardiovascular:  Normal rate and regular rhythm.   No murmur heard. Pulmonary/Chest: Effort normal and breath sounds normal. No respiratory distress. She has no wheezes. She has no rales.  Musculoskeletal: She exhibits no edema.  Neurological: She is alert.          Assessment & Plan:  #1 intermittent palpitations. Patient has obtained recent episodes of heart rate up to 180-190 usually with episodes lasting about 30 minutes. Needs further evaluation. Recent thyroid functions normal. Set up event monitor. Set up echocardiogram. Discussed possible beta blocker use would like to get studies first.  Follow-up immediately for any chest pains, dyspnea, dizziness. Gradually scale back caffeine use. Schedule follow-up in one month Did not obtain EKG today as she had one recently at urgent care and we are trying to get that result faxed over.  #2 hypertension. Poorly controlled today. Monitor closely at home. May consider addition of beta blocker as above if blood pressure remains up

## 2015-12-18 NOTE — Progress Notes (Signed)
Pre visit review using our clinic review tool, if applicable. No additional management support is needed unless otherwise documented below in the visit note. 

## 2015-12-23 ENCOUNTER — Ambulatory Visit (INDEPENDENT_AMBULATORY_CARE_PROVIDER_SITE_OTHER): Payer: 59

## 2015-12-23 DIAGNOSIS — R002 Palpitations: Secondary | ICD-10-CM | POA: Diagnosis not present

## 2016-01-01 ENCOUNTER — Ambulatory Visit (HOSPITAL_COMMUNITY): Payer: 59 | Attending: Cardiology

## 2016-01-01 ENCOUNTER — Other Ambulatory Visit: Payer: Self-pay

## 2016-01-01 DIAGNOSIS — I10 Essential (primary) hypertension: Secondary | ICD-10-CM | POA: Insufficient documentation

## 2016-01-01 DIAGNOSIS — E785 Hyperlipidemia, unspecified: Secondary | ICD-10-CM | POA: Insufficient documentation

## 2016-01-01 DIAGNOSIS — I340 Nonrheumatic mitral (valve) insufficiency: Secondary | ICD-10-CM | POA: Diagnosis not present

## 2016-01-01 DIAGNOSIS — R002 Palpitations: Secondary | ICD-10-CM | POA: Insufficient documentation

## 2016-01-01 DIAGNOSIS — I071 Rheumatic tricuspid insufficiency: Secondary | ICD-10-CM | POA: Insufficient documentation

## 2016-01-01 DIAGNOSIS — Z87891 Personal history of nicotine dependence: Secondary | ICD-10-CM | POA: Diagnosis not present

## 2016-01-01 DIAGNOSIS — Z8249 Family history of ischemic heart disease and other diseases of the circulatory system: Secondary | ICD-10-CM | POA: Insufficient documentation

## 2016-01-15 ENCOUNTER — Ambulatory Visit (INDEPENDENT_AMBULATORY_CARE_PROVIDER_SITE_OTHER): Payer: 59 | Admitting: Family Medicine

## 2016-01-15 VITALS — BP 148/80 | HR 64 | Temp 98.0°F | Ht 67.0 in | Wt 186.0 lb

## 2016-01-15 DIAGNOSIS — R002 Palpitations: Secondary | ICD-10-CM

## 2016-01-15 NOTE — Progress Notes (Signed)
Pre visit review using our clinic review tool, if applicable. No additional management support is needed unless otherwise documented below in the visit note. 

## 2016-01-15 NOTE — Progress Notes (Signed)
   Subjective:    Patient ID: Stephanie [name], female    DOB: 1956-06-26, 60 y.o.   MRN: [id number]  HPI Follow-up regarding palpitations. Patient's had no further episodes since last visit. Echocardiogram was basically normal. Event monitor in place. She has scaled back caffeine use. Denies any recent chest pains or dyspnea. Recent TSH normal  Hypertension treated with lisinopril HCTZ. We discussed possible addition of low-dose beta blocker pending evaluation but her blood pressures been very well controlled at home mostly [id number] systolic.  Past Medical History  Diagnosis Date  . Hypertension   . Hyperlipidemia    No past surgical history on file.  reports that she quit smoking about 5 years ago. Her smoking use included Cigarettes. She has a 30 pack-year smoking history. She does not have any smokeless tobacco history on file. She reports that she drinks alcohol. Her drug history is not on file. family history includes Alzheimer's disease in her mother; Heart disease (age of onset: 31) in her brother and father. No Known Allergies    Review of Systems  Constitutional: Negative for fatigue.  Eyes: Negative for visual disturbance.  Respiratory: Negative for cough, chest tightness, shortness of breath and wheezing.   Cardiovascular: Negative for chest pain, palpitations (not since last visit) and leg swelling.  Neurological: Negative for dizziness, seizures, syncope, weakness, light-headedness and headaches.       Objective:   Physical Exam  Constitutional: She appears well-developed and well-nourished.  Eyes: Pupils are equal, round, and reactive to light.  Neck: Neck supple. No JVD present. No thyromegaly present.  Cardiovascular: Normal rate and regular rhythm.  Exam reveals no gallop and no friction rub.   No murmur heard. Pulmonary/Chest: Effort normal and breath sounds normal. No respiratory distress. She has no wheezes. She has no rales.  Musculoskeletal: She exhibits  no edema.  Neurological: She is alert.          Assessment & Plan:  Recent palpitations with reported tachycardia with palpated heart rate up around 180. She's had no episodes whatsoever since last visit. Recent TSH normal. Echocardiogram unremarkable. Event monitor in place. Follow-up immediately for any further episodes. If event monitor unremarkable and patient has persistent episodes consider referral to EP specialist

## 2016-01-15 NOTE — Patient Instructions (Signed)
We will call with event monitor results Continue to reduce caffeine use Follow up for any recurrent episodes of palpitations.

## 2016-01-18 ENCOUNTER — Telehealth: Payer: Self-pay | Admitting: Internal Medicine

## 2016-01-18 NOTE — Telephone Encounter (Signed)
On Call Cardiology   [facility] from [place] called ([phone]). First episode of AF with RVR documented. Recording is for 1.5 minutes (not a complete episode). Now patient back in sinus rhythm at 60 bpm. Attempts to reach Ms. Sallis were unsuccessful.    Wandra Mannan, MD

## 2016-01-19 NOTE — Telephone Encounter (Signed)
Patient has OV with Dr. Caryl Comes 4/17.

## 2016-01-19 NOTE — Telephone Encounter (Signed)
Needs to be seen this early this week - having PAF with increased CHADS2VASC score

## 2016-01-19 NOTE — Telephone Encounter (Signed)
Was able to contact pt. She has no symptoms today.  Transient palpitation yesterday with A Flutter around 200 bpm around 2 pm.  Do I need to put in Cardiology referral or can you guys go ahead and schedule?

## 2016-01-19 NOTE — Telephone Encounter (Signed)
Late entry from 01/19/16 0954 (see paper copy of monitor for notes).  Dr. Elease Hashimoto ordered monitor Radford Pax was DOD). Event occurred 01/18/16 2:02PM EDT that showed atrial flutter around 200 bpm. Dr. Susy Manor attempted to reach patient at that time and was unable. Attempted to call patient again today with no success. Will continue to try to call patient to schedule OV.

## 2016-01-19 NOTE — Telephone Encounter (Signed)
I have never seen this patient - she needs to be worked in with DOD or extender

## 2016-01-20 NOTE — Telephone Encounter (Signed)
Per scheduling, patient was offered earlier appointments and were declined because dates and times were not suitable to her. Scheduling understands to call patient if more convenient appointments become available.

## 2016-01-26 ENCOUNTER — Ambulatory Visit (INDEPENDENT_AMBULATORY_CARE_PROVIDER_SITE_OTHER): Payer: 59 | Admitting: Internal Medicine

## 2016-01-26 ENCOUNTER — Encounter: Payer: Self-pay | Admitting: Internal Medicine

## 2016-01-26 VITALS — BP 124/70 | HR 70 | Ht 67.0 in | Wt 182.0 lb

## 2016-01-26 DIAGNOSIS — R002 Palpitations: Secondary | ICD-10-CM

## 2016-01-26 DIAGNOSIS — I480 Paroxysmal atrial fibrillation: Secondary | ICD-10-CM

## 2016-01-26 HISTORY — DX: Paroxysmal atrial fibrillation: I48.0

## 2016-01-26 MED ORDER — ATENOLOL 50 MG PO TABS: ORAL_TABLET | ORAL | Status: DC

## 2016-01-26 MED ORDER — METOPROLOL TARTRATE 50 MG PO TABS: 50.0000 mg | ORAL_TABLET | Freq: Two times a day (BID) | ORAL | Status: DC

## 2016-01-26 MED ORDER — METOPROLOL SUCCINATE ER 50 MG PO TB24: 50.0000 mg | ORAL_TABLET | Freq: Every day | ORAL | Status: DC

## 2016-01-26 NOTE — Patient Instructions (Signed)
Medication Instructions: 1) You have been given prescriptions for 3 different beta blockers to try. You may take them in any order, but DO NOT take more than one at a time. - Atenolol 50 mg one tablet by mouth once daily - Metoprolol succinate 50 mg one tablet by mouth once daily  - Metoprolol tartrate 50 mg one tablet by mouth twice daily   2) Stop lisinopril/ hctz  3) You will stop your aspirin once you start on a blood thinner  Labwork: - none  Procedures/Testing: - none  Follow-Up: - Your physician recommends that you schedule a follow-up appointment in: 10-12 weeks with Roderic Palau, NP for Dr. Caryl Comes in the [place] clinic   Any Additional Special Instructions Will Be Listed Below (If Applicable). Please check the cost of the different blood thinners to see which is more cost efficient for you- once you decide, please call Nira Conn, RN for Dr. Caryl Comes at [phone]. - Eliquis one tablet twice daily - Pradaxa one tablet twice daily - Xarelto one tablet once daily    If you need a refill on your cardiac medications before your next appointment, please call your pharmacy.

## 2016-01-26 NOTE — Progress Notes (Signed)
ELECTROPHYSIOLOGY CONSULT NOTE  Patient ID: ZADE [name], MRN: [id number], DOB/AGE: 04-16-1956 60 y.o. Admit date: (Not on file) Date of Consult: 01/26/2016  Primary Physician: Eulas Post, MD Primary Cardiologist: new  Consulting Physician [place]te  Chief Complaint: tachypalpitations   HPI URIEL [name] is a 60 y.o. female  Seen 1 year history of abrupt onset offset tachypalpitations. They typically last minutes up to about 30 minutes at a time. They're associated with chest heaviness some dyspnea and discombobulated palpitations.  Apart from these episodes she has no complaints of exercise tolerance. Denying chest pain shortness of breath peripheral edema orthopnea, she also has noted no transient neurologic episodes.  She has been on aspirin for some time. Presumably this was started for her family history of coronary disease involving both her father and her brother in their 49s.  She has sleep disordered breathing but denies sleep apnea.   She comes today with an event recorder. I reviewed with Dr. Rayann Heman. It demonstrates the abrupt onset of a tachycardia that is irregular and associated with aberration particularly with long short intervals. There is regularization. Maximum rates appear to be in the 220-to 40 range  Thyroid studies  TSH fT4 and T3 normal 3/17 Echo 3/17 normal    Past Medical History  Diagnosis Date  . Hypertension   . Hyperlipidemia   . Paroxysmal atrial fibrillation ([place]) 01/26/2016      Surgical History: No past surgical history on file.   Home Meds: Prior to Admission medications   Medication Sig Start Date End Date Taking? Authorizing Provider  aspirin 81 MG tablet Take 81 mg by mouth daily.     Yes Historical Provider, MD  cholecalciferol (VITAMIN D) 1000 UNITS tablet Take 1,000 Units by mouth daily.     Yes Historical Provider, MD  clotrimazole-betamethasone (LOTRISONE) cream Apply 1 application topically 2 (two) times daily.  07/30/15  Yes Eulas Post, MD  fish oil-omega-3 fatty acids 1000 MG capsule Take 2 g by mouth daily.     Yes Historical Provider, MD  FLUoxetine (PROZAC) 20 MG capsule Take 20 mg by mouth daily.   Yes Historical Provider, MD  lisinopril-hydrochlorothiazide (PRINZIDE,ZESTORETIC) 20-12.5 MG tablet TAKE ONE TABLET BY MOUTH ONCE DAILY 07/18/15  Yes Eulas Post, MD  simvastatin (ZOCOR) 40 MG tablet TAKE 1 TABLET BY MOUTH AT  BEDTIME 12/01/15  Yes Eulas Post, MD    Allergies: No Known Allergies  Social History   Social History  . Marital Status: Married    Spouse Name: N/A  . Number of Children: N/A  . Years of Education: N/A   Occupational History  . Not on file.   Social History Main Topics  . Smoking status: Former Smoker -- 1.00 packs/day for 30 years    Types: Cigarettes    Quit date: 05/31/2010  . Smokeless tobacco: Not on file  . Alcohol Use: Yes  . Drug Use: Not on file  . Sexual Activity: Not on file   Other Topics Concern  . Not on file   Social History Narrative     Family History  Problem Relation Age of Onset  . Heart disease Father 39    CAD  . Sudden death      family hx  . Alzheimer's disease Mother   . Heart disease Brother 29    CAD     ROS:  Please see the history of present illness.     All other systems reviewed  and negative.    Physical Exam: Blood pressure 124/70, pulse 70, height 5\' 7"  (1.702 m), weight 182 lb (82.555 kg). General: Well developed, well nourished female in no acute distress. Head: Normocephalic, atraumatic, sclera non-icteric, no xanthomas, nares are without discharge. EENT: normal  Lymph Nodes:  none Neck: Negative for carotid bruits. JVD not elevated. Back:without scoliosis kyphosis  Lungs: Clear bilaterally to auscultation without wheezes, rales, or rhonchi. Breathing is unlabored. Heart: RRR with S1 S2. No  * murmur . No rubs, or gallops appreciated. Abdomen: Soft, non-tender, non-distended with  normoactive bowel sounds. No hepatomegaly. No rebound/guarding. No obvious abdominal masses. Msk:  Strength and tone appear normal for age. Extremities: No clubbing or cyanosis. No* edema.  Distal pedal pulses are 2+ and equal bilaterally. Skin: Warm and Dry Neuro: Alert and oriented X 3. CN III-XII intact Grossly normal sensory and motor function . Psych:  Responds to questions appropriately with a normal affect.      Labs: Cardiac Enzymes No results for input(s): CKTOTAL, CKMB, TROPONINI in the last 72 hours. CBC Lab Results  Component Value Date   WBC 4.4 07/23/2015   HGB 13.4 07/23/2015   HCT 41.0 07/23/2015   MCV 93.0 07/23/2015   PLT 338.0 07/23/2015   PROTIME: No results for input(s): LABPROT, INR in the last 72 hours. Chemistry No results for input(s): NA, K, CL, CO2, BUN, CREATININE, CALCIUM, PROT, BILITOT, ALKPHOS, ALT, AST, GLUCOSE in the last 168 hours.  Invalid input(s): LABALBU Lipids Lab Results  Component Value Date   CHOL 253* 07/23/2015   HDL 88.90 07/23/2015   LDLCALC 149* 07/23/2015   TRIG 75.0 07/23/2015   BNP No results found for: PROBNP Thyroid Function Tests: No results for input(s): TSH, T4TOTAL, T3FREE, THYROIDAB in the last 72 hours.  Invalid input(s): FREET3 Miscellaneous No results found for: DDIMER  Radiology/Studies:  No results found.  EKG:  3/17 sinus rhythm at 65 Intervals 16/08/44   Assessment and Plan:  Atrial fibrillation-paroxysmal   hypertension  Sleep disordered breathing  Family history of coronary disease   The patient hasAtrial fibrillation with a rapid rate associated with aberration. The episodes are surprisingly short-lived. We have discussed rate control versus rhythm control strategies. She already has a history of hypertension and notably with hypokalemia, he elected a rate controlling strategy using beta blockers to address both her blood pressure as well as her rapid atrial fibrillation. We've given her  prescriptions for atenolol 50 metoprolol succinate 50 and metoprolol tartrate 50 twice a day.  We have also discussed anticoagulation. Her CHADS-VASc score is 2. It is somewhat mitigated by the brevity of her episodes as well as her young age. However, given the fact that she has been on aspirin, we have elected to pursue NOAC therapy. We will wait to hear from her on cost.  It is her impression that she does not have sleep apnea although she does have sleep disordered breathing. I would have a low threshold for pursuing a sleep study.    Virl Axe

## 2016-01-27 ENCOUNTER — Telehealth: Payer: Self-pay | Admitting: Internal Medicine

## 2016-01-27 NOTE — Telephone Encounter (Signed)
New message      Calling to let the doctor know she has decided on taking eliquis.  [facility]

## 2016-01-29 MED ORDER — APIXABAN 5 MG PO TABS: 5.0000 mg | ORAL_TABLET | Freq: Two times a day (BID) | ORAL | Status: DC

## 2016-01-29 NOTE — Telephone Encounter (Signed)
Eliquis 5 mg BID? Last BMP was 07/2015.

## 2016-01-29 NOTE — Telephone Encounter (Signed)
RX sent to [facility].

## 2016-01-29 NOTE — Telephone Encounter (Signed)
Yes 5 bid

## 2016-03-10 ENCOUNTER — Other Ambulatory Visit: Payer: Self-pay | Admitting: Internal Medicine

## 2016-03-11 NOTE — Telephone Encounter (Signed)
OK to refill,thanks

## 2016-03-11 NOTE — Telephone Encounter (Signed)
Ok to go ahead and extend refill of this medication?

## 2016-03-26 ENCOUNTER — Other Ambulatory Visit: Payer: Self-pay | Admitting: Family Medicine

## 2016-03-26 NOTE — Telephone Encounter (Signed)
Refill for one year 

## 2016-04-13 IMAGING — US US CAROTID DUPLEX BILAT
1 series · 13 of 24 positions shown · non-contrast
Comparison: None.

CLINICAL DATA: Hypertension, visual disturbance, hyperlipidemia,
remote smoker.

EXAM:
BILATERAL CAROTID DUPLEX ULTRASOUND
TECHNIQUE: Gray scale imaging, color Doppler and duplex ultrasound were
performed of bilateral carotid and vertebral arteries in the neck.

[Series 1: us carotid duplex bilat · 0.06mm/px · 13 of 57 slices shown]
[im 1/57]
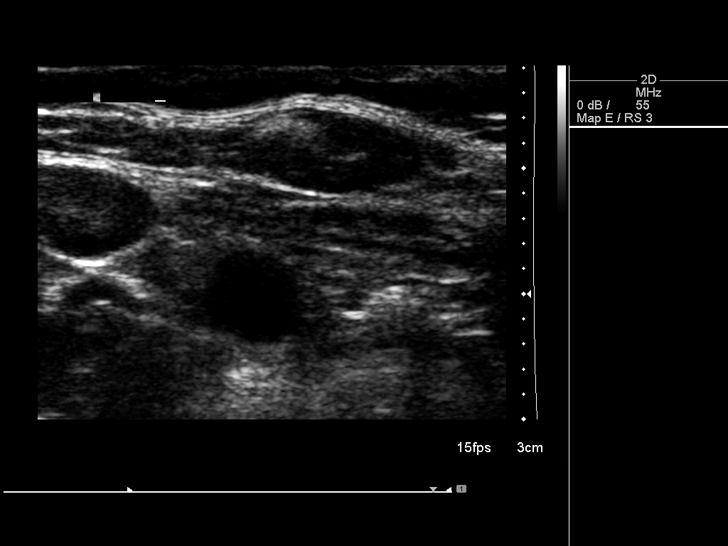
[im 5/57]
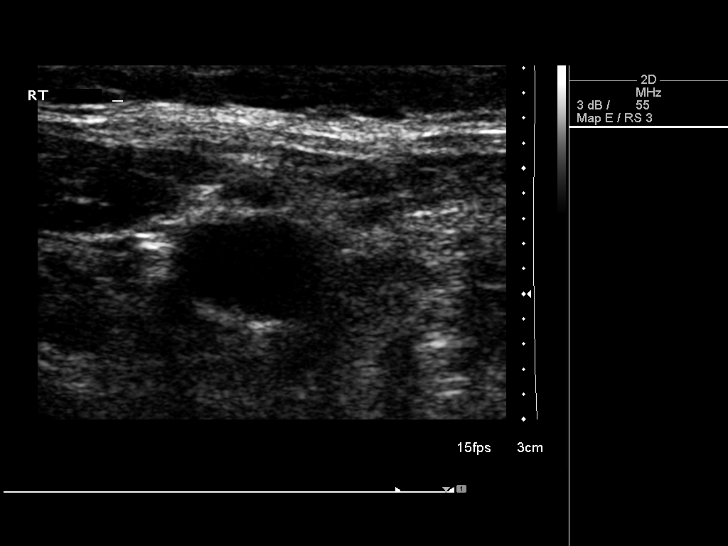
[im 10/57]
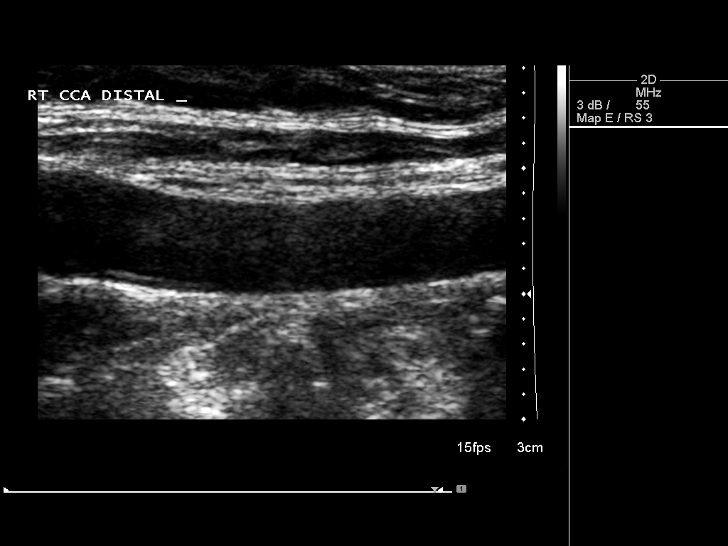
[im 15/57]
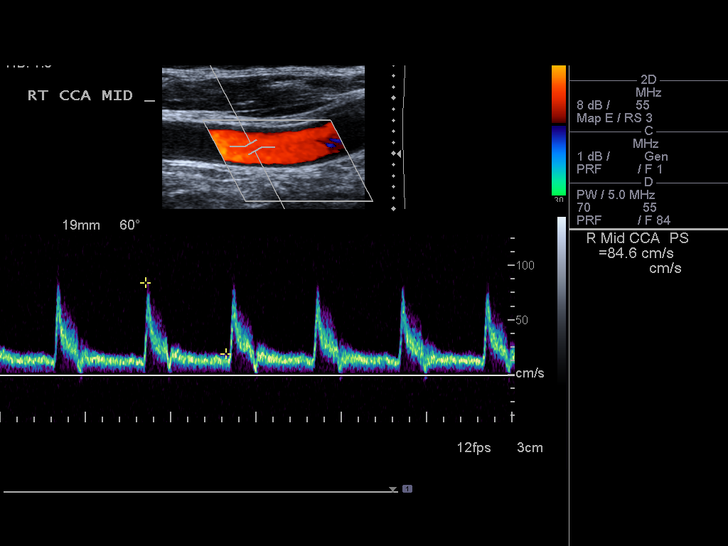
[im 20/57]
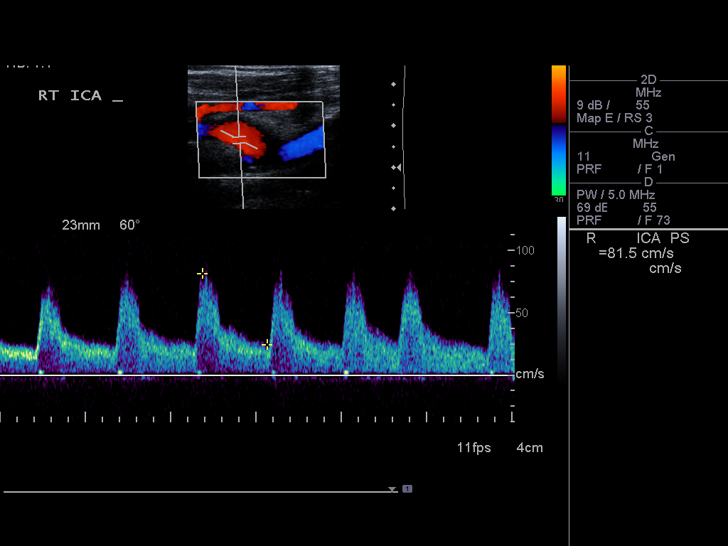
[im 25/57]
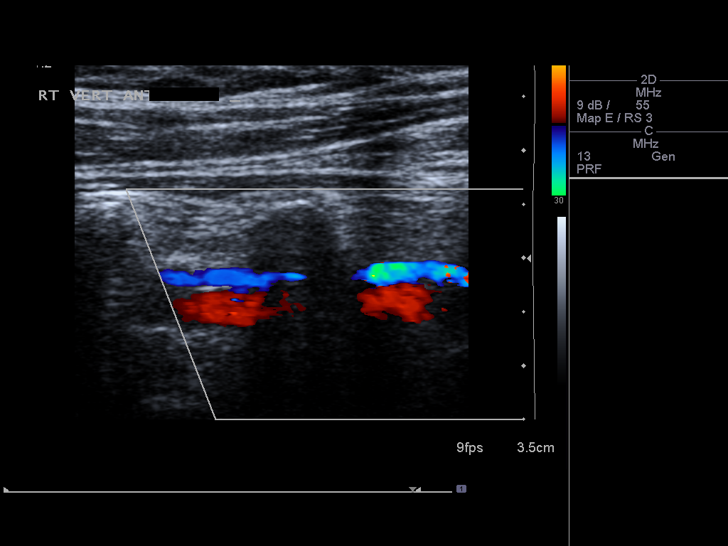
[im 30/57]
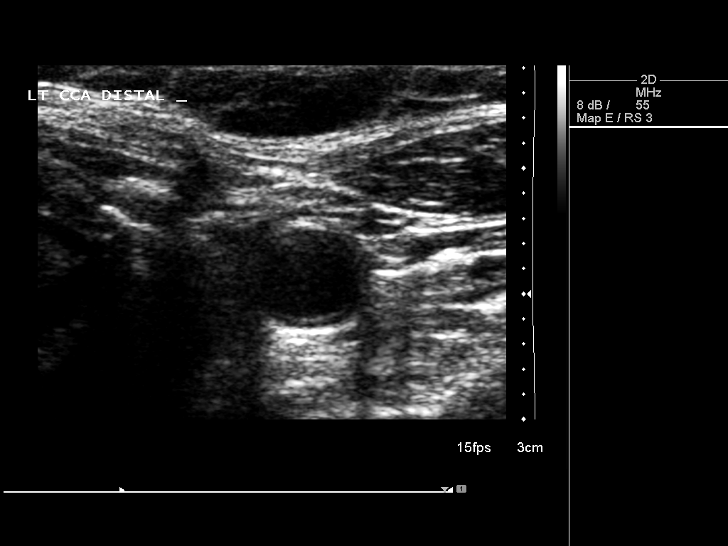
[im 32/57]
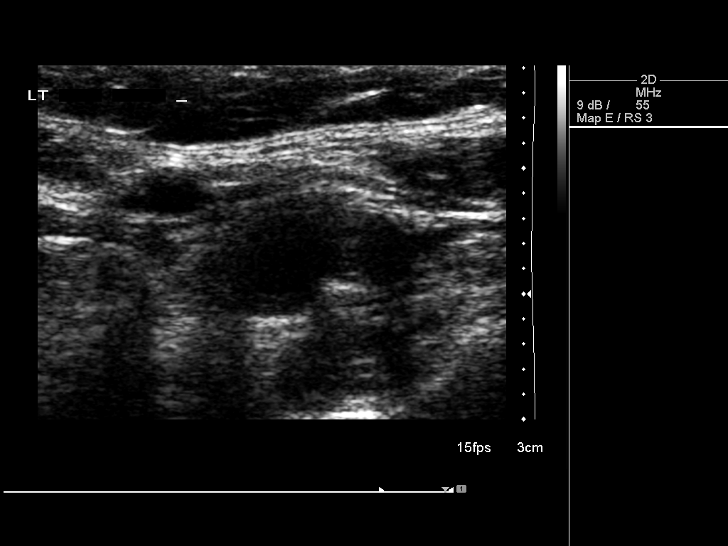
[im 37/57]
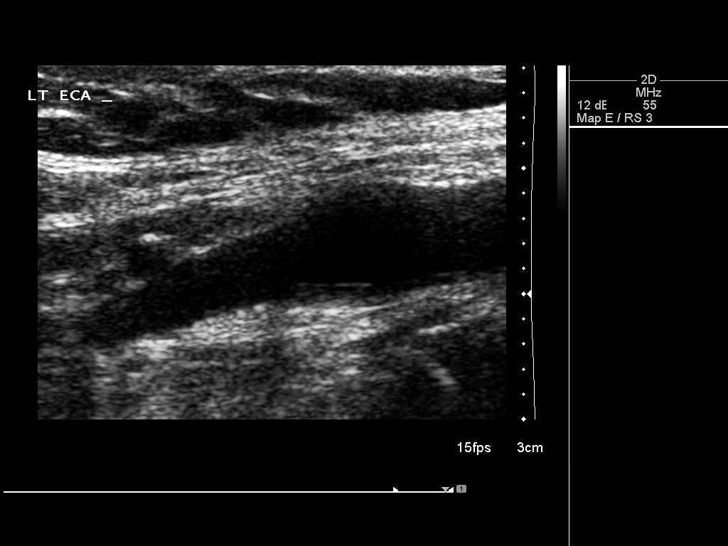
[im 42/57]
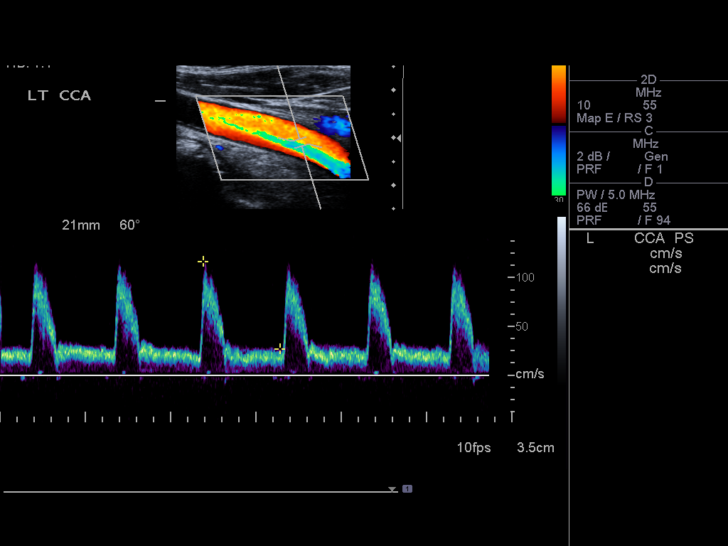
[im 47/57]
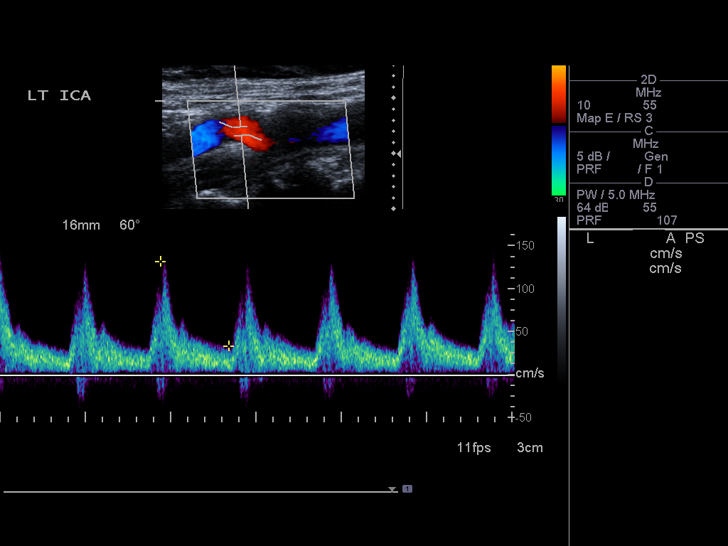
[im 52/57]
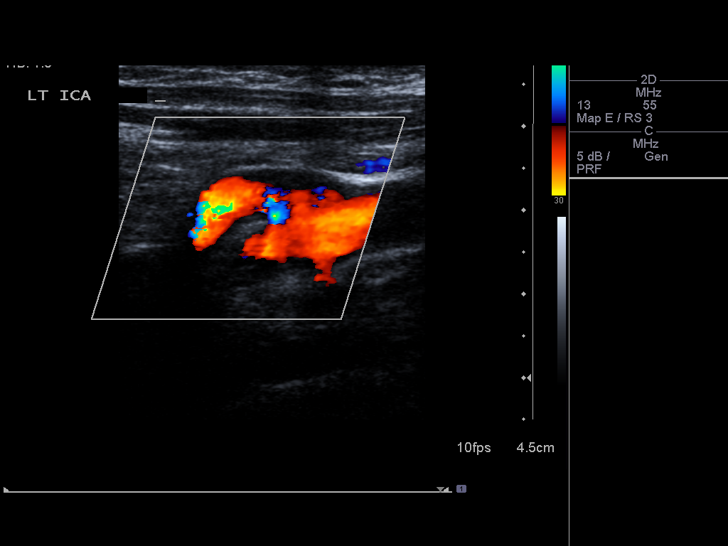
[im 57/57]
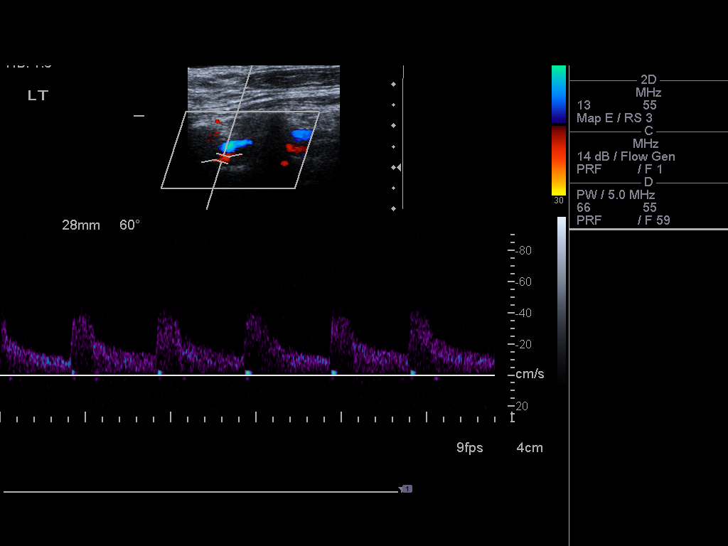

[13 of 24 positions shown; findings below may reference images not displayed]

FINDINGS: Criteria: Quantification of carotid stenosis is based on velocity
parameters that correlate the residual internal carotid diameter
with NASCET-based stenosis levels, using the diameter of the distal
internal carotid lumen as the denominator for stenosis measurement.

The following velocity measurements were obtained:

RIGHT

ICA:  100/40 cm/sec

CCA:  99/25 cm/sec

SYSTOLIC ICA/CCA RATIO:

DIASTOLIC ICA/CCA RATIO:

ECA:  83 cm/sec

LEFT

ICA:  133/34 cm/sec

CCA:  117/27 cm/sec

SYSTOLIC ICA/CCA RATIO:

DIASTOLIC ICA/CCA RATIO:

ECA:  81 cm/sec

RIGHT CAROTID ARTERY: Minor atherosclerotic plaque formation. No
hemodynamically significant right ICA stenosis, velocity elevation,
or turbulent flow. Degree of narrowing less than 50%.

RIGHT VERTEBRAL ARTERY:  Antegrade

LEFT CAROTID ARTERY: Minor atherosclerotic plaque formation. No
hemodynamically significant left ICA stenosis, velocity elevation,
or turbulent flow.

LEFT VERTEBRAL ARTERY:  Antegrade
IMPRESSION: Minor atherosclerotic plaque formation. No hemodynamically
significant ICA stenosis. Degree of narrowing less than 50%
bilaterally.

## 2016-05-14 LAB — HM MAMMOGRAPHY: HM Mammogram: NORMAL (ref 0–4)

## 2016-05-20 ENCOUNTER — Encounter: Payer: Self-pay | Admitting: Family Medicine

## 2016-05-25 ENCOUNTER — Encounter (HOSPITAL_COMMUNITY): Payer: Self-pay | Admitting: Nurse Practitioner

## 2016-05-25 ENCOUNTER — Ambulatory Visit (HOSPITAL_COMMUNITY)
Admission: RE | Admit: 2016-05-25 | Discharge: 2016-05-25 | Disposition: A | Discharge status: Home / Self Care | Payer: 59 | Source: Ambulatory Visit | Attending: Nurse Practitioner | Admitting: Nurse Practitioner

## 2016-05-25 VITALS — BP 158/88 | HR 51 | Ht 67.0 in | Wt 184.0 lb

## 2016-05-25 DIAGNOSIS — R001 Bradycardia, unspecified: Secondary | ICD-10-CM | POA: Diagnosis not present

## 2016-05-25 DIAGNOSIS — I480 Paroxysmal atrial fibrillation: Secondary | ICD-10-CM

## 2016-05-25 DIAGNOSIS — I4891 Unspecified atrial fibrillation: Secondary | ICD-10-CM | POA: Diagnosis present

## 2016-05-25 MED ORDER — RIVAROXABAN 20 MG PO TABS: 20.0000 mg | ORAL_TABLET | Freq: Every day | ORAL | 1 refills | Status: DC

## 2016-05-25 MED ORDER — DILTIAZEM HCL ER COATED BEADS 120 MG PO CP24: 120.0000 mg | ORAL_CAPSULE | Freq: Every day | ORAL | 3 refills | Status: DC

## 2016-05-25 MED ORDER — METOPROLOL SUCCINATE ER 50 MG PO TB24: ORAL_TABLET | ORAL | 9 refills | Status: DC

## 2016-05-25 MED ORDER — SIMVASTATIN 10 MG PO TABS: 10.0000 mg | ORAL_TABLET | Freq: Every day | ORAL | 6 refills | Status: DC

## 2016-05-25 MED ORDER — RIVAROXABAN 20 MG PO TABS: 20.0000 mg | ORAL_TABLET | Freq: Every day | ORAL | 6 refills | Status: DC

## 2016-05-25 NOTE — Patient Instructions (Signed)
Your physician has recommended you make the following change in your medication:  1)Stop Eliquis 2)Stop daily Metoprolol -- take 1/2 tablet of metoprolol as needed for breakthrough afib. As long as your heart rate is above 100 and your blood pressure (top number) is above 100. 3)Start Cardizem 120mg  once a day 4)Start Xarelto 20mg  once a day with a meal 5)Decrease Zocor to 10mg  once a day

## 2016-05-25 NOTE — Progress Notes (Signed)
Primary Care Physician: Eulas Post, MD Referring Physician: Dr. Jolyn Nap   Stephanie [name] is a 60 y.o. female with a h/o abrupt onset tachy palpitations, dx per Dr. Caryl Comes as PAF, 4/17. She was started on BB and xarelto and referred to the afib clinic for f/u. She is doing well on blood thinner without issues. She has not noted any further afib on BB, but feels as though since she has been on drug that her mood is depressed. She is also having difficulty remembering to take DOAC 2x a day.  Review of triggers for afib reveal that pt drinks beer nightly, large amount of caffeine, does not exercise and is overweight. She does snore but does not believe she has apnea, and denies daytime somnolence.   Today, she denies symptoms of palpitations, chest pain, shortness of breath, orthopnea, PND, lower extremity edema, dizziness, presyncope, syncope, or neurologic sequela. The patient is tolerating medications without difficulties and is otherwise without complaint today.   Past Medical History:  Diagnosis Date  . Hyperlipidemia   . Hypertension   . Paroxysmal atrial fibrillation ([place]) 01/26/2016   No past surgical history on file.  Current Outpatient Prescriptions  Medication Sig Dispense Refill  . cholecalciferol (VITAMIN D) 1000 UNITS tablet Take 1,000 Units by mouth daily.      . fish oil-omega-3 fatty acids 1000 MG capsule Take 2 g by mouth daily.      Marland Kitchen FLUoxetine (PROZAC) 20 MG capsule TAKE 1 CAPSULE DAILY 90 capsule 3  . metoprolol succinate (TOPROL-XL) 50 MG 24 hr tablet Take 1/2 tablet by mouth if needed for breakthrough afib for HR >100 as long as BP > 100 30 tablet 9  . simvastatin (ZOCOR) 10 MG tablet Take 1 tablet (10 mg total) by mouth at bedtime. 30 tablet 6  . clotrimazole-betamethasone (LOTRISONE) cream Apply 1 application topically 2 (two) times daily. (Patient not taking: Reported on 05/25/2016) 30 g 0  . diltiazem (CARDIZEM CD) 120 MG 24 hr capsule Take 1 capsule (120  mg total) by mouth daily. 30 capsule 3  . rivaroxaban (XARELTO) 20 MG TABS tablet Take 1 tablet (20 mg total) by mouth daily with supper. 30 tablet 6   No current facility-administered medications for this encounter.     No Known Allergies  Social History   Social History  . Marital status: Married    Spouse name: N/A  . Number of children: N/A  . Years of education: N/A   Occupational History  . Not on file.   Social History Main Topics  . Smoking status: Former Smoker    Packs/day: 1.00    Years: 30.00    Types: Cigarettes    Quit date: 05/31/2010  . Smokeless tobacco: Not on file  . Alcohol use Yes  . Drug use: Unknown  . Sexual activity: Not on file   Other Topics Concern  . Not on file   Social History Narrative  . No narrative on file    Family History  Problem Relation Age of Onset  . Heart disease Father 51    CAD  . Sudden death      family hx  . Alzheimer's disease Mother   . Heart disease Brother 27    CAD    ROS- All systems are reviewed and negative except as per the HPI above  Physical Exam: Vitals:   05/25/16 0837  BP: (!) 158/88  Pulse: (!) 51  Weight: 184 lb (83.5 kg)  Height:  5\' 7"  (1.702 m)    GEN- The patient is well appearing, alert and oriented x 3 today.   Head- normocephalic, atraumatic Eyes-  Sclera clear, conjunctiva pink Ears- hearing intact Oropharynx- clear Neck- supple, no JVP Lymph- no cervical lymphadenopathy Lungs- Clear to ausculation bilaterally, normal work of breathing Heart- Regular rate and rhythm, no murmurs, rubs or gallops, PMI not laterally displaced GI- soft, NT, ND, + BS Extremities- no clubbing, cyanosis, or edema MS- no significant deformity or atrophy Skin- no rash or lesion Psych- euthymic mood, full affect Neuro- strength and sensation are intact  EKG- Sinus brady at 51 bpm, Pr int 168 ms, qrs int 86 mx, qtc 398 ms Epic records reviewed Echo-Left ventricle: The cavity size was normal. Wall  thickness was   normal. Systolic function was normal. The estimated ejection   fraction was in the range of 55% to 60%. Wall motion was normal;   there were no regional wall motion abnormalities. Left   ventricular diastolic function parameters were normal.  Impressions:  - Normal LV function; trace MR and TR.  Labs-10/6- Creat 0.6, K+5.1     Assessment and Plan: 1. PAF No tachyrhythmia noted since pt started metoprolol ER but she feels that it is depressing her mood Change to cardizem 120 mg a day  With switch to CCB, will need to reduce simvastatin to 10 mg a day  Will change eliquis to xarelto 20 mg a day due to not being able to remember twice daily dosing Asked to reduce alcohol, caffeine intake Develop exercise program and calorie restriction for weight loss  F/u 3 months in afib clinic  Reve [name], [facility] [address] [place], [place] [address] [phone]

## 2016-06-11 DIAGNOSIS — K5792 Diverticulitis of intestine, part unspecified, without perforation or abscess without bleeding: Secondary | ICD-10-CM

## 2016-06-11 HISTORY — DX: Diverticulitis of intestine, part unspecified, without perforation or abscess without bleeding: K57.92

## 2016-07-09 ENCOUNTER — Ambulatory Visit (INDEPENDENT_AMBULATORY_CARE_PROVIDER_SITE_OTHER): Payer: 59 | Admitting: Family Medicine

## 2016-07-09 VITALS — BP 150/88 | HR 68 | Temp 98.1°F | Ht 67.0 in | Wt 181.0 lb

## 2016-07-09 DIAGNOSIS — R1030 Lower abdominal pain, unspecified: Secondary | ICD-10-CM

## 2016-07-09 MED ORDER — METRONIDAZOLE 500 MG PO TABS: 500.0000 mg | ORAL_TABLET | Freq: Three times a day (TID) | ORAL | 0 refills | Status: DC

## 2016-07-09 MED ORDER — CIPROFLOXACIN HCL 500 MG PO TABS: 500.0000 mg | ORAL_TABLET | Freq: Two times a day (BID) | ORAL | 0 refills | Status: DC

## 2016-07-09 NOTE — Progress Notes (Signed)
Subjective:     Patient ID: Stephanie [name], female   DOB: 04-23-1956, 60 y.o.   MRN: [id number]  HPI Patient seen with onset Tuesday night of somewhat poorly localized lower abdominal pain. Wednesday she thinks she may have had some fever but temperature not taken. She had some chills. She's not had any since then. No change in bowel habits. She's had some slight nausea but no vomiting. Her pain is lower abdomen somewhat right and left side. No dysuria whatsoever. She thinks her pain is slightly worse with movement. Pain has not really been progressive since onset but no better. She still has fairly well preserved appetite. She's never had colonoscopy. No known history of diverticular disease.  Past Medical History:  Diagnosis Date  . Hyperlipidemia   . Hypertension   . Paroxysmal atrial fibrillation ([place]) 01/26/2016   No past surgical history on file.  reports that she quit smoking about 6 years ago. Her smoking use included Cigarettes. She has a 30.00 pack-year smoking history. She does not have any smokeless tobacco history on file. She reports that she drinks alcohol. Her drug history is not on file. family history includes Alzheimer's disease in her mother; Heart disease (age of onset: 24) in her brother and father. No Known Allergies   Review of Systems  Constitutional: Negative for chills and fever.  Respiratory: Negative for cough and shortness of breath.   Gastrointestinal: Positive for abdominal pain and nausea. Negative for abdominal distention, blood in stool, constipation, diarrhea and vomiting.  Genitourinary: Negative for dysuria.  Neurological: Negative for dizziness.       Objective:   Physical Exam  Constitutional: She appears well-developed and well-nourished. No distress.  Cardiovascular: Normal rate and regular rhythm.   Pulmonary/Chest: Effort normal and breath sounds normal. No respiratory distress. She has no wheezes. She has no rales.  Abdominal: Soft. Bowel  sounds are normal. She exhibits no distension and no mass. There is tenderness. There is no rebound and no guarding.  Patient has mild tenderness to palpation left lower quadrant but also slightly right lower quadrant. No guarding or rebound       Assessment:     Poorly localized lower abdominal pain right and left side. No known history of diverticular disease. No evidence for acute abdomen    Plan:     -We discussed lab work and possible CT abdomen pelvis but explained this will have to be through emergency department as she was seen around 4 PM on Friday afternoon with inadequate time to get CT otherwise. She does not have evidence for acute abdomen and we reviewed things to watch for that would prompt going to ER to get further evaluation -Cover for possible diverticulitis with Cipro and Flagyl  Eulas Post MD [facility]

## 2016-07-09 NOTE — Patient Instructions (Signed)

## 2016-07-29 ENCOUNTER — Other Ambulatory Visit (INDEPENDENT_AMBULATORY_CARE_PROVIDER_SITE_OTHER): Payer: 59

## 2016-07-29 DIAGNOSIS — Z0000 Encounter for general adult medical examination without abnormal findings: Secondary | ICD-10-CM | POA: Diagnosis not present

## 2016-07-29 LAB — CBC WITH DIFFERENTIAL/PLATELET
BASOS ABS: 0.1 10*3/uL (ref 0.0–0.1)
BASOS PCT: 1.4 % (ref 0.0–3.0)
EOS ABS: 0.1 10*3/uL (ref 0.0–0.7)
Eosinophils Relative: 2.2 % (ref 0.0–5.0)
HCT: 40.5 % (ref 36.0–46.0)
Hemoglobin: 13.7 g/dL (ref 12.0–15.0)
LYMPHS PCT: 25.9 % (ref 12.0–46.0)
Lymphs Abs: 1.4 10*3/uL (ref 0.7–4.0)
MCHC: 33.9 g/dL (ref 30.0–36.0)
MCV: 90.8 fl (ref 78.0–100.0)
MONO ABS: 0.5 10*3/uL (ref 0.1–1.0)
Monocytes Relative: 10.1 % (ref 3.0–12.0)
NEUTROS ABS: 3.2 10*3/uL (ref 1.4–7.7)
Neutrophils Relative %: 60.4 % (ref 43.0–77.0)
PLATELETS: 349 10*3/uL (ref 150.0–400.0)
RBC: 4.46 Mil/uL (ref 3.87–5.11)
RDW: 12.7 % (ref 11.5–15.5)
WBC: 5.3 10*3/uL (ref 4.0–10.5)

## 2016-07-29 LAB — BASIC METABOLIC PANEL
BUN: 15 mg/dL (ref 6–23)
CALCIUM: 9.8 mg/dL (ref 8.4–10.5)
CHLORIDE: 101 meq/L (ref 96–112)
CO2: 28 meq/L (ref 19–32)
CREATININE: 0.68 mg/dL (ref 0.40–1.20)
GFR: 93.23 mL/min (ref >60.00)
Glucose, Bld: 91 mg/dL (ref 70–99)
Potassium: 4.5 mEq/L (ref 3.5–5.1)
Sodium: 136 mEq/L (ref 135–145)

## 2016-07-29 LAB — LIPID PANEL
CHOL/HDL RATIO: 3
Cholesterol: 281 mg/dL — ABNORMAL HIGH (ref 0–200)
HDL: 80.3 mg/dL (ref >39.00)
LDL CALC: 184 mg/dL — AB (ref 0–99)
NONHDL: 200.61
TRIGLYCERIDES: 85 mg/dL (ref 0.0–149.0)
VLDL: 17 mg/dL (ref 0.0–40.0)

## 2016-07-29 LAB — HEPATIC FUNCTION PANEL
ALK PHOS: 63 U/L (ref 39–117)
ALT: 24 U/L (ref 0–35)
AST: 21 U/L (ref 0–37)
Albumin: 4.3 g/dL (ref 3.5–5.2)
BILIRUBIN DIRECT: 0 mg/dL (ref 0.0–0.3)
BILIRUBIN TOTAL: 0.6 mg/dL (ref 0.2–1.2)
Total Protein: 7.1 g/dL (ref 6.0–8.3)

## 2016-07-29 LAB — TSH: TSH: 1.68 u[IU]/mL (ref 0.35–4.50)

## 2016-08-06 ENCOUNTER — Encounter: Payer: 59 | Admitting: Family Medicine

## 2016-08-10 ENCOUNTER — Ambulatory Visit (INDEPENDENT_AMBULATORY_CARE_PROVIDER_SITE_OTHER): Payer: 59 | Admitting: Family Medicine

## 2016-08-10 ENCOUNTER — Encounter: Payer: Self-pay | Admitting: Family Medicine

## 2016-08-10 ENCOUNTER — Encounter: Payer: Self-pay | Admitting: Physician Assistant

## 2016-08-10 VITALS — BP 122/80 | HR 74 | Temp 98.6°F | Ht 67.0 in | Wt 181.0 lb

## 2016-08-10 DIAGNOSIS — Z0000 Encounter for general adult medical examination without abnormal findings: Secondary | ICD-10-CM | POA: Diagnosis not present

## 2016-08-10 MED ORDER — SIMVASTATIN 10 MG PO TABS: 10.0000 mg | ORAL_TABLET | Freq: Every day | ORAL | 2 refills | Status: DC

## 2016-08-10 NOTE — Progress Notes (Signed)
Pre visit review using our clinic review tool, if applicable. No additional management support is needed unless otherwise documented below in the visit note. 

## 2016-08-10 NOTE — Progress Notes (Signed)
Subjective:     Patient ID: Stephanie [name], female   DOB: 1956-07-21, 60 y.o.   MRN: [id number]  HPI Patient here for physical exam. She had some intermittent palpitations during the past year and it monitor revealed paroxysmal atrial fibrillation. She is now taking Xarelto once daily along with diltiazem. She had some increased depression symptoms with beta blocker and those have improved since switching to diltiazem. She is not aware of any recent palpitations. Has never had screening colonoscopy. Immunizations are up-to-date. She is considering retirement later this year. She has interested in going ahead with screening colonoscopy at this time.  Quit smoking 2011 but does occasionally smoke cigarettes now. No cough. No dyspnea. No hemoptysis. No recent appetite or weight changes.  Past Medical History:  Diagnosis Date  . Hyperlipidemia   . Hypertension   . Paroxysmal atrial fibrillation ([place]) 01/26/2016   History reviewed. No pertinent surgical history.  reports that she quit smoking about 6 years ago. Her smoking use included Cigarettes. She has a 30.00 pack-year smoking history. She has never used smokeless tobacco. She reports that she drinks alcohol. Her drug history is not on file. family history includes Alzheimer's disease in her mother; Heart disease (age of onset: 44) in her brother and father. No Known Allergies   Review of Systems  Constitutional: Negative for activity change, appetite change, fatigue, fever and unexpected weight change.  HENT: Negative for ear pain, hearing loss, sore throat and trouble swallowing.   Eyes: Negative for visual disturbance.  Respiratory: Negative for cough and shortness of breath.   Cardiovascular: Negative for chest pain and palpitations.  Gastrointestinal: Negative for abdominal pain, blood in stool, constipation and diarrhea.  Endocrine: Negative for polydipsia and polyuria.  Genitourinary: Negative for dysuria and hematuria.   Musculoskeletal: Negative for arthralgias, back pain and myalgias.  Skin: Negative for rash.  Neurological: Negative for dizziness, syncope and headaches.  Hematological: Negative for adenopathy.  Psychiatric/Behavioral: Negative for confusion and dysphoric mood.       Objective:   Physical Exam  Constitutional: She is oriented to person, place, and time. She appears well-developed and well-nourished.  HENT:  Head: Normocephalic and atraumatic.  Eyes: EOM are normal. Pupils are equal, round, and reactive to light.  Neck: Normal range of motion. Neck supple. No thyromegaly present.  Cardiovascular: Normal rate, regular rhythm and normal heart sounds.   No murmur heard. Pulmonary/Chest: Breath sounds normal. No respiratory distress. She has no wheezes. She has no rales.  Abdominal: Soft. Bowel sounds are normal. She exhibits no distension and no mass. There is no tenderness. There is no rebound and no guarding.  Musculoskeletal: Normal range of motion. She exhibits no edema.  Lymphadenopathy:    She has no cervical adenopathy.  Neurological: She is alert and oriented to person, place, and time. She displays normal reflexes. No cranial nerve deficit.  Skin: No rash noted.  Psychiatric: She has a normal mood and affect. Her behavior is normal. Judgment and thought content normal.       Assessment:     Physical exam. Patient in need of screening colonoscopy. History of transient atrial fibrillation currently sinus rhythm and on Xarelto and diltiazem and tolerating well    Plan:     -She will continue with GYN follow-up -Set up screening colonoscopy -Discussed consideration for low-dose lung CT cancer screening. She does meet criteria. She'll check on insurance coverage first. -Establish more consistent exercise -She is encouraged to quit smoking altogether  Alinda Sierras  Demont Linford MD [facility] at [facility]

## 2016-08-10 NOTE — Patient Instructions (Signed)
Check on insurance coverage for low dose CT lung cancer screen and let us know if interested We will set up colonoscopy referral.

## 2016-08-24 ENCOUNTER — Inpatient Hospital Stay (HOSPITAL_COMMUNITY): Admission: RE | Admit: 2016-08-24 | Payer: 59 | Source: Ambulatory Visit | Admitting: Nurse Practitioner

## 2016-08-24 ENCOUNTER — Ambulatory Visit: Payer: 59 | Admitting: Physician Assistant

## 2016-08-25 ENCOUNTER — Ambulatory Visit (INDEPENDENT_AMBULATORY_CARE_PROVIDER_SITE_OTHER): Payer: 59 | Admitting: Physician Assistant

## 2016-08-25 ENCOUNTER — Telehealth: Payer: Self-pay | Admitting: *Deleted

## 2016-08-25 ENCOUNTER — Other Ambulatory Visit: Payer: Self-pay | Admitting: *Deleted

## 2016-08-25 ENCOUNTER — Encounter: Payer: Self-pay | Admitting: Physician Assistant

## 2016-08-25 VITALS — BP 160/84 | HR 68 | Ht 67.0 in | Wt 183.4 lb

## 2016-08-25 DIAGNOSIS — Z1212 Encounter for screening for malignant neoplasm of rectum: Secondary | ICD-10-CM | POA: Diagnosis not present

## 2016-08-25 DIAGNOSIS — Z1211 Encounter for screening for malignant neoplasm of colon: Secondary | ICD-10-CM | POA: Diagnosis not present

## 2016-08-25 DIAGNOSIS — Z7901 Long term (current) use of anticoagulants: Secondary | ICD-10-CM

## 2016-08-25 DIAGNOSIS — Z01818 Encounter for other preprocedural examination: Secondary | ICD-10-CM | POA: Diagnosis not present

## 2016-08-25 MED ORDER — NA SULFATE-K SULFATE-MG SULF 17.5-3.13-1.6 GM/177ML PO SOLN: 1.0000 | Freq: Once | ORAL | 0 refills | Status: AC

## 2016-08-25 NOTE — Progress Notes (Signed)
Chief Complaint: Pre-procedural exam, Screening colonoscopy, Chronic anticoagulation  HPI:  Stephanie [name] is a 60 year old female with a past medical history of hyperlipidemia, hypertension and A. fib maintained on Xarelto (01/01/16 echo LVEF 55-60%),  who was referred to me by Eulas Post, MD for a consultation of a screening colonoscopy and preprocedural exam in a patient on chronic anticoagulation.   Today, the patient tells me that she has never had a screening colonoscopy. Her primary care provider has been telling her to get this done and she is finally coming into our office. The patient denies any GI complaints today. She does tell me she has maintained on Xarelto for A. fib, this was recently changed 2 months ago from a prior anticoagulant which she cannot remember the name of. She has been doing well.   Patient denies fever, chills, blood in her stool, melena, change in bowel habits, weight loss, fatigue, anorexia, nausea, vomiting, heartburn, reflux or abdominal pain.  Past Medical History:  Diagnosis Date  . Hyperlipidemia   . Hypertension   . Paroxysmal atrial fibrillation ([place]) 01/26/2016    No past surgical history on file.  Current Outpatient Prescriptions  Medication Sig Dispense Refill  . cholecalciferol (VITAMIN D) 1000 UNITS tablet Take 1,000 Units by mouth daily.      Marland Kitchen diltiazem (CARDIZEM CD) 120 MG 24 hr capsule Take 1 capsule (120 mg total) by mouth daily. 30 capsule 3  . fish oil-omega-3 fatty acids 1000 MG capsule Take 2 g by mouth daily.      Marland Kitchen FLUoxetine (PROZAC) 20 MG capsule TAKE 1 CAPSULE DAILY 90 capsule 3  . metoprolol succinate (TOPROL-XL) 50 MG 24 hr tablet Take 1/2 tablet by mouth if needed for breakthrough afib for HR >100 as long as BP > 100 (Patient not taking: Reported on 08/10/2016) 30 tablet 9  . rivaroxaban (XARELTO) 20 MG TABS tablet Take 1 tablet (20 mg total) by mouth daily with supper. 30 tablet 6  . simvastatin (ZOCOR) 10 MG tablet Take 1  tablet (10 mg total) by mouth at bedtime. 90 tablet 2   No current facility-administered medications for this visit.     Allergies as of 08/25/2016  . (No Known Allergies)    Family History  Problem Relation Age of Onset  . Heart disease Father 32    CAD  . Sudden death      family hx  . Alzheimer's disease Mother   . Heart disease Brother 49    CAD    Social History   Social History  . Marital status: Married    Spouse name: N/A  . Number of children: N/A  . Years of education: N/A   Occupational History  . Not on file.   Social History Main Topics  . Smoking status: Former Smoker    Packs/day: 1.00    Years: 30.00    Types: Cigarettes    Quit date: 05/31/2010  . Smokeless tobacco: Never Used  . Alcohol use Yes  . Drug use: Unknown  . Sexual activity: Not on file   Other Topics Concern  . Not on file   Social History Narrative  . No narrative on file    Review of Systems:     Constitutional: No weight loss, fever, chills, weakness or fatigue HEENT: Eyes: No change in vision               Ears, Nose, Throat:  No change in hearing or congestion Skin: No rash  or itching Cardiovascular: No chest pain, chest pressure or palpitations   Respiratory: No SOB or cough Gastrointestinal: See HPI and otherwise negative Genitourinary: No dysuria or change in urinary frequency Neurological: No headache, dizziness or syncope Musculoskeletal: No new muscle or joint pain Hematologic: No bleeding or bruising Psychiatric: No history of depression or anxiety   Physical Exam:  Vital signs: BP (!) 160/84 (BP Location: Left Arm, Patient Position: Sitting, Cuff Size: Normal)   Pulse 68   Ht 5\' 7"  (1.702 m)   Wt 183 lb 6.4 oz (83.2 kg)   BMI 28.72 kg/m    General:   Pleasant overweight Caucasian female appears to be in NAD, Well developed, Well nourished, alert and cooperative Head:  Normocephalic and atraumatic. Eyes:   PEERL, EOMI. No icterus. Conjunctiva  pink. Ears:  Normal auditory acuity. Neck:  Supple Throat: Oral cavity and pharynx without inflammation, swelling or lesion.  Lungs: Respirations even and unlabored. Lungs clear to auscultation bilaterally.   No wheezes, crackles, or rhonchi.  Heart: Normal S1, S2. No MRG. Regular rate and rhythm. No peripheral edema, cyanosis or pallor.  Abdomen:  Soft, nondistended, nontender. No rebound or guarding. Normal bowel sounds. No appreciable masses or hepatomegaly. Rectal:  Not performed.  Msk:  Symmetrical without gross deformities.  Extremities:  Without edema, no deformity or joint abnormality. Normal ROM. Neurologic:  Alert and  oriented x4;  grossly normal neurologically. = Skin:   Dry and intact without significant lesions or rashes. Psychiatric: Oriented to person, place and time. Demonstrates good judgement and reason without abnormal affect or behaviors.  MOST RECENT LABS:  CBC    Component Value Date/Time   WBC 5.3 07/29/2016 0808   RBC 4.46 07/29/2016 0808   HGB 13.7 07/29/2016 0808   HCT 40.5 07/29/2016 0808   PLT 349.0 07/29/2016 0808   MCV 90.8 07/29/2016 0808   MCHC 33.9 07/29/2016 0808   RDW 12.7 07/29/2016 0808   LYMPHSABS 1.4 07/29/2016 0808   MONOABS 0.5 07/29/2016 0808   EOSABS 0.1 07/29/2016 0808   BASOSABS 0.1 07/29/2016 0808    CMP     Component Value Date/Time   NA 136 07/29/2016 0808   K 4.5 07/29/2016 0808   CL 101 07/29/2016 0808   CO2 28 07/29/2016 0808   GLUCOSE 91 07/29/2016 0808   BUN 15 07/29/2016 0808   CREATININE 0.68 07/29/2016 0808   CALCIUM 9.8 07/29/2016 0808   PROT 7.1 07/29/2016 0808   ALBUMIN 4.3 07/29/2016 0808   AST 21 07/29/2016 0808   ALT 24 07/29/2016 0808   ALKPHOS 63 07/29/2016 0808   BILITOT 0.6 07/29/2016 0808   GFRNONAA 110.01 07/01/2010 0903    Assessment: 1. Preprocedural exam for screening colonoscopy in a patient on chronic anticoagulation: The patient is 60 years old and has never had a screening colonoscopy,  recommend she proceed with one now, she will need to hold her Xarelto which she is on for A. fib for 1 day prior to procedure  Plan: 1. Recommend proceeding with screening colonoscopy. Discussed risks, benefits, limitations and alternatives and the patient agrees to proceed. This was scheduled with Dr. Silverio Decamp in the [facility]. 2. Patient advised to hold her Xarelto 1 day prior to the procedure. We will communicate with her cardiologist, Roderic Palau, FNP, to ensure that holding her Xarelto is acceptable.  3. Patient to return to clinic per Dr. Woodward Ku recommendations after time procedure.  Ellouise Newer, PA-C [facility] 08/25/2016, 9:07 AM  Cc: Burchette, Alinda Sierras,  MD

## 2016-08-25 NOTE — Telephone Encounter (Signed)
08/25/2016   RE: Stephanie [name] DOB: July 05, 1956 MRN: [id number]   Dear Roderic Palau NP,    We have scheduled the above patient for an endoscopic procedure. Our records show that she is on anticoagulation therapy.   Please advise as to how long the patient may come off her therapy of Xarelto prior to the procedure, which is scheduled for 09-10-2016.  Please  route the Xarelto clearance instructions to [facility] CMA.    Sincerely,    Ellouise Newer PA-C

## 2016-08-25 NOTE — Patient Instructions (Signed)

## 2016-08-25 NOTE — Telephone Encounter (Signed)
Per Roderic Palau, NP patient may stop Xarelto 1-2 days prior to procedure and resume as soon as possible after procedure.

## 2016-08-26 NOTE — Progress Notes (Signed)
Reviewed and agree with documentation and assessment and plan. Please discuss with [facility] to hold Xarelto 2 days prior to the procedure K. Denzil Magnuson , MD

## 2016-08-27 ENCOUNTER — Encounter: Payer: Self-pay | Admitting: Gastroenterology

## 2016-09-06 ENCOUNTER — Other Ambulatory Visit: Payer: Self-pay | Admitting: *Deleted

## 2016-09-06 NOTE — Telephone Encounter (Signed)
Patient informed on 11-27 of instructions for Xarelto clearance. She is to hold 11-29, 11-30. And 12-1.  Resume on 12-2.

## 2016-09-07 ENCOUNTER — Encounter: Payer: 59 | Admitting: Gastroenterology

## 2016-09-10 ENCOUNTER — Ambulatory Visit (AMBULATORY_SURGERY_CENTER): Payer: 59 | Admitting: Gastroenterology

## 2016-09-10 ENCOUNTER — Encounter: Payer: Self-pay | Admitting: Gastroenterology

## 2016-09-10 VITALS — BP 155/89 | HR 55 | Temp 98.6°F | Resp 37 | Ht 67.0 in | Wt 183.0 lb

## 2016-09-10 DIAGNOSIS — Z1212 Encounter for screening for malignant neoplasm of rectum: Secondary | ICD-10-CM

## 2016-09-10 DIAGNOSIS — D125 Benign neoplasm of sigmoid colon: Secondary | ICD-10-CM

## 2016-09-10 DIAGNOSIS — Z1211 Encounter for screening for malignant neoplasm of colon: Secondary | ICD-10-CM | POA: Diagnosis not present

## 2016-09-10 MED ORDER — SODIUM CHLORIDE 0.9 % IV SOLN: 500.0000 mL | INTRAVENOUS | Status: AC

## 2016-09-10 NOTE — Progress Notes (Signed)
Called to room to assist during endoscopic procedure.  Patient ID and intended procedure confirmed with present staff. Received instructions for my participation in the procedure from the performing physician.  

## 2016-09-10 NOTE — Op Note (Signed)
[place] [place] Patient Name: Stephanie [name] Procedure Date: 09/10/2016 3:43 PM MRN: [id number] Endoscopist: Mauri Pole , MD Age: 60 Referring MD:  Date of Birth: Feb 22, 1956 Gender: Female Account #: [id number] Procedure:                Colonoscopy Indications:              Screening for colorectal malignant neoplasm, This                            is the patient's first colonoscopy Medicines:                Monitored Anesthesia Care Procedure:                Pre-Anesthesia Assessment:                           - Prior to the procedure, a History and Physical                            was performed, and patient medications and                            allergies were reviewed. The patient's tolerance of                            previous anesthesia was also reviewed. The risks                            and benefits of the procedure and the sedation                            options and risks were discussed with the patient.                            All questions were answered, and informed consent                            was obtained. Prior Anticoagulants: The patient has                            taken no previous anticoagulant or antiplatelet                            agents. ASA Grade Assessment: II - A patient with                            mild systemic disease. After reviewing the risks                            and benefits, the patient was deemed in                            satisfactory condition to undergo the procedure.  After obtaining informed consent, the colonoscope                            was passed under direct vision. Throughout the                            procedure, the patient's blood pressure, pulse, and                            oxygen saturations were monitored continuously. The                            Model CF-HQ190L [id number]) scope was introduced                            through the anus and  advanced to the the terminal                            ileum, with identification of the appendiceal                            orifice and IC valve. The colonoscopy was performed                            without difficulty. The patient tolerated the                            procedure well. The quality of the bowel                            preparation was excellent. The terminal ileum,                            ileocecal valve, appendiceal orifice, and rectum                            were photographed. Scope In: 3:52:48 PM Scope Out: 4:06:24 PM Scope Withdrawal Time: 0 hours 9 minutes 43 seconds  Total Procedure Duration: 0 hours 13 minutes 36 seconds  Findings:                 The perianal and digital rectal examinations were                            normal.                           Multiple small and large-mouthed diverticula were                            found in the sigmoid colon and descending colon.                           A 2 mm polyp was found in the sigmoid colon. The  polyp was sessile. The polyp was removed with a                            cold biopsy forceps. Resection and retrieval were                            complete.                           Non-bleeding internal hemorrhoids were found during                            retroflexion. The hemorrhoids were small. Complications:            No immediate complications. Estimated Blood Loss:     Estimated blood loss was minimal. Impression:               - Diverticulosis in the sigmoid colon and in the                            descending colon.                           - One 2 mm polyp in the sigmoid colon, removed with                            a cold biopsy forceps. Resected and retrieved.                           - Non-bleeding internal hemorrhoids. Recommendation:           - Patient has a contact number available for                            emergencies. The signs and  symptoms of potential                            delayed complications were discussed with the                            patient. Return to normal activities tomorrow.                            Written discharge instructions were provided to the                            patient.                           - Resume previous diet.                           - Continue present medications.                           - Resume Xarelto (rivaroxaban) at prior dose  tomorrow. Refer to managing physician for further                            adjustment of therapy.                           - Await pathology results.                           - Repeat colonoscopy in 5-10 years for surveillance                            based on pathology results.                           - Return to GI clinic PRN. Mauri Pole, MD 09/10/2016 4:14:10 PM This report has been signed electronically.

## 2016-09-10 NOTE — Patient Instructions (Signed)
YOU HAD AN ENDOSCOPIC PROCEDURE TODAY AT [facility]:   Refer to the procedure report that was given to you for any specific questions about what was found during the examination.  If the procedure report does not answer your questions, please call your gastroenterologist to clarify.  If you requested that your care partner not be given the details of your procedure findings, then the procedure report has been included in a sealed envelope for you to review at your convenience later.  YOU SHOULD EXPECT: Some feelings of bloating in the abdomen. Passage of more gas than usual.  Walking can help get rid of the air that was put into your GI tract during the procedure and reduce the bloating. If you had a lower endoscopy (such as a colonoscopy or flexible sigmoidoscopy) you may notice spotting of blood in your stool or on the toilet paper. If you underwent a bowel prep for your procedure, you may not have a normal bowel movement for a few days.  Please Note:  You might notice some irritation and congestion in your nose or some drainage.  This is from the oxygen used during your procedure.  There is no need for concern and it should clear up in a day or so.  SYMPTOMS TO REPORT IMMEDIATELY:   Following lower endoscopy (colonoscopy or flexible sigmoidoscopy):  Excessive amounts of blood in the stool  Significant tenderness or worsening of abdominal pains  Swelling of the abdomen that is new, acute  Fever of 100F or higher   For urgent or emergent issues, a gastroenterologist can be reached at any hour by calling [phone].   DIET:  We do recommend a small meal at first, but then you may proceed to your regular diet.  Drink plenty of fluids but you should avoid alcoholic beverages for 24 hours.  ACTIVITY:  You should plan to take it easy for the rest of today and you should NOT DRIVE or use heavy machinery until tomorrow (because of the sedation medicines used during the test).     FOLLOW UP: Our staff will call the number listed on your records the next business day following your procedure to check on you and address any questions or concerns that you may have regarding the information given to you following your procedure. If we do not reach you, we will leave a message.  However, if you are feeling well and you are not experiencing any problems, there is no need to return our call.  We will assume that you have returned to your regular daily activities without incident.  If any biopsies were taken you will be contacted by phone or by letter within the next 1-3 weeks.  Please call us at [phone] if you have not heard about the biopsies in 3 weeks.    SIGNATURES/CONFIDENTIALITY: You and/or your care partner have signed paperwork which will be entered into your electronic medical record.  These signatures attest to the fact that that the information above on your After Visit Summary has been reviewed and is understood.  Full responsibility of the confidentiality of this discharge information lies with you and/or your care-partner.  Polyp, diverticulosis, high fiber diet, hemorrhoids-handouts given  Repeat colonoscopy will be determined by pathology.  Resume Xarelto at prior dose tomorrow. 09/11/16.   Continue current medications.

## 2016-09-10 NOTE — Progress Notes (Signed)
To recovery, report to [place], RN, VSS. 

## 2016-09-13 ENCOUNTER — Telehealth: Payer: Self-pay

## 2016-09-13 NOTE — Telephone Encounter (Signed)
  Follow up Call-  Call back number 09/10/2016  Post procedure Call Back phone  # [phone]  Permission to leave phone message Yes  Some recent data might be hidden     Patient questions:  Do you have a fever, pain , or abdominal swelling? No. Pain Score  0 *  Have you tolerated food without any problems? Yes.    Have you been able to return to your normal activities? Yes.    Do you have any questions about your discharge instructions: Diet   No. Medications  No. Follow up visit  No.  Do you have questions or concerns about your Care? No.  Actions: * If pain score is 4 or above: No action needed, pain <4.

## 2016-09-23 ENCOUNTER — Ambulatory Visit (HOSPITAL_COMMUNITY)
Admission: RE | Admit: 2016-09-23 | Discharge: 2016-09-23 | Disposition: A | Discharge status: Home / Self Care | Payer: 59 | Source: Ambulatory Visit | Attending: Nurse Practitioner | Admitting: Nurse Practitioner

## 2016-09-23 ENCOUNTER — Encounter (HOSPITAL_COMMUNITY): Payer: Self-pay | Admitting: Nurse Practitioner

## 2016-09-23 VITALS — BP 154/94 | HR 62 | Ht 67.0 in | Wt 180.6 lb

## 2016-09-23 DIAGNOSIS — Z87891 Personal history of nicotine dependence: Secondary | ICD-10-CM | POA: Diagnosis not present

## 2016-09-23 DIAGNOSIS — I480 Paroxysmal atrial fibrillation: Secondary | ICD-10-CM | POA: Insufficient documentation

## 2016-09-23 DIAGNOSIS — Z5189 Encounter for other specified aftercare: Secondary | ICD-10-CM | POA: Diagnosis not present

## 2016-09-23 DIAGNOSIS — Z79899 Other long term (current) drug therapy: Secondary | ICD-10-CM | POA: Insufficient documentation

## 2016-09-23 MED ORDER — DILTIAZEM HCL ER COATED BEADS 120 MG PO CP24: 120.0000 mg | ORAL_CAPSULE | Freq: Every day | ORAL | 3 refills | Status: DC

## 2016-09-23 NOTE — Progress Notes (Signed)
Primary Care Physician: Eulas Post, MD Referring Physician: Dr. Jolyn Nap   Stephanie [name] is a 60 y.o. female with a h/o abrupt onset tachy palpitations, dx per Dr. Caryl Comes as PAF, 4/17. She was started on BB and xarelto and referred to the afib clinic for f/u. She is doing well on blood thinner without issues. She has not noted any further afib on BB, but feels as though since she has been on drug that her mood is depressed. She is also having difficulty remembering to take DOAC 2x a day.  Review of triggers for afib reveal that pt drinks beer nightly, large amount of caffeine, does not exercise and is overweight. She does snore but does not believe she has apnea, and denies daytime somnolence.   She returns to the afib clinic for f/u 12/14. On last visit BB was changed to cardizem and eliquis was changed to once daily xarelto. She reports no afb burden.  No bleeding issues.  Today, she denies symptoms of palpitations, chest pain, shortness of breath, orthopnea, PND, lower extremity edema, dizziness, presyncope, syncope, or neurologic sequela. The patient is tolerating medications without difficulties and is otherwise without complaint today.   Past Medical History:  Diagnosis Date  . Diverticulitis 06/2016  . Hyperlipidemia   . Hypertension   . Paroxysmal atrial fibrillation ([place]) 01/26/2016   No past surgical history on file.  Current Outpatient Prescriptions  Medication Sig Dispense Refill  . cholecalciferol (VITAMIN D) 1000 UNITS tablet Take 1,000 Units by mouth daily.      Marland Kitchen diltiazem (CARDIZEM CD) 120 MG 24 hr capsule Take 1 capsule (120 mg total) by mouth daily. 90 capsule 3  . fish oil-omega-3 fatty acids 1000 MG capsule Take 2 g by mouth daily.      Marland Kitchen FLUoxetine (PROZAC) 20 MG capsule TAKE 1 CAPSULE DAILY 90 capsule 3  . rivaroxaban (XARELTO) 20 MG TABS tablet Take 1 tablet (20 mg total) by mouth daily with supper. 30 tablet 6  . simvastatin (ZOCOR) 10 MG tablet Take 1  tablet (10 mg total) by mouth at bedtime. 90 tablet 2  . metoprolol succinate (TOPROL-XL) 50 MG 24 hr tablet Take 1/2 tablet by mouth if needed for breakthrough afib for HR >100 as long as BP > 100 (Patient not taking: Reported on 09/23/2016) 30 tablet 9   Current Facility-Administered Medications  Medication Dose Route Frequency Provider Last Rate Last Dose  . 0.9 %  sodium chloride infusion  500 mL Intravenous Continuous Mauri Pole, MD        No Known Allergies  Social History   Social History  . Marital status: Married    Spouse name: N/A  . Number of children: 2  . Years of education: N/A   Occupational History  . Not on file.   Social History Main Topics  . Smoking status: Former Smoker    Packs/day: 1.00    Years: 30.00    Types: Cigarettes    Quit date: 05/31/2010  . Smokeless tobacco: Never Used  . Alcohol use 1.2 oz/week    2 Cans of beer per week  . Drug use: No  . Sexual activity: Not on file   Other Topics Concern  . Not on file   Social History Narrative  . No narrative on file    Family History  Problem Relation Age of Onset  . Heart disease Father 108    CAD  . Sudden death  family hx  . Alzheimer's disease Mother   . Heart disease Brother 60    CAD    ROS- All systems are reviewed and negative except as per the HPI above  Physical Exam: Vitals:   09/23/16 0839  BP: (!) 154/94  Pulse: 62  Weight: 180 lb 9.6 oz (81.9 kg)  Height: 5\' 7"  (1.702 m)    GEN- The patient is well appearing, alert and oriented x 3 today.   Head- normocephalic, atraumatic Eyes-  Sclera clear, conjunctiva pink Ears- hearing intact Oropharynx- clear Neck- supple, no JVP Lymph- no cervical lymphadenopathy Lungs- Clear to ausculation bilaterally, normal work of breathing Heart- Regular rate and rhythm, no murmurs, rubs or gallops, PMI not laterally displaced GI- soft, NT, ND, + BS Extremities- no clubbing, cyanosis, or edema MS- no significant  deformity or atrophy Skin- no rash or lesion Psych- euthymic mood, full affect Neuro- strength and sensation are intact  EKG- Sinus brady at 62 bpm, Pr int 160 ms, qrs int 92 mx, qtc 426 ms Epic records reviewed Echo-Left ventricle: The cavity size was normal. Wall thickness was   normal. Systolic function was normal. The estimated ejection   fraction was in the range of 55% to 60%. Wall motion was normal;   there were no regional wall motion abnormalities. Left   ventricular diastolic function parameters were normal.  Impressions:  - Normal LV function; trace MR and TR.  Labs-10/6- Creat 0.6, K+5.1     Assessment and Plan: 1. PAF Denies any afib burden Continue  cardizem 120 mg a day  Continue xarelto 20 mg a day  Continue to reduce alcohol, caffeine intake Encouraged exercise program and calorie restriction for weight loss  F/u afib clinic as needed  [name], [facility] [address] [place], [place] [address] [phone]

## 2016-09-26 ENCOUNTER — Encounter: Payer: Self-pay | Admitting: Gastroenterology

## 2017-02-14 ENCOUNTER — Telehealth: Payer: Self-pay | Admitting: Family Medicine

## 2017-02-14 ENCOUNTER — Ambulatory Visit (INDEPENDENT_AMBULATORY_CARE_PROVIDER_SITE_OTHER): Payer: 59 | Admitting: Family Medicine

## 2017-02-14 ENCOUNTER — Encounter: Payer: Self-pay | Admitting: Family Medicine

## 2017-02-14 VITALS — BP 110/64 | HR 70 | Temp 98.9°F | Wt 185.1 lb

## 2017-02-14 DIAGNOSIS — R1030 Lower abdominal pain, unspecified: Secondary | ICD-10-CM | POA: Diagnosis not present

## 2017-02-14 DIAGNOSIS — K5792 Diverticulitis of intestine, part unspecified, without perforation or abscess without bleeding: Secondary | ICD-10-CM

## 2017-02-14 MED ORDER — METRONIDAZOLE 500 MG PO TABS: 500.0000 mg | ORAL_TABLET | Freq: Three times a day (TID) | ORAL | 0 refills | Status: DC

## 2017-02-14 MED ORDER — CIPROFLOXACIN HCL 500 MG PO TABS: 500.0000 mg | ORAL_TABLET | Freq: Two times a day (BID) | ORAL | 0 refills | Status: DC

## 2017-02-14 NOTE — Telephone Encounter (Signed)
Pt was last seen oct 2017. Pt does have gi md. Pt said she has diverticulitis flare up and requesting abx send to [facility] in [place]. Pt was offered an appt

## 2017-02-14 NOTE — Patient Instructions (Signed)

## 2017-02-14 NOTE — Progress Notes (Signed)
Subjective:     Patient ID: Stephanie [name], female   DOB: 1955/12/19, 61 y.o.   MRN: [id number]  HPI  Patient seen for acute visit today for lower abdominal pain. Her pain is mostly near the midline. She denies any urinary symptoms. She's had acute diverticulitis the past and symptoms are very similar then. Onset yesterday. She had some chills but no document of fever. She has some nausea yesterday and a couple of "dry heaves ". She has no nausea today. Has been on something today. Drinking fluids well. No stool changes. No exacerbating or alleviating factors.  She's had history of presumed acute diverticulitis the past. She had colonoscopy last year which showed diverticulosis in the descending and sigmoid colon. No recent bloody stool. She is on anticoagulation with Xarelto for atrial fibrillation.  Past Medical History:  Diagnosis Date  . Diverticulitis 06/2016  . Hyperlipidemia   . Hypertension   . Paroxysmal atrial fibrillation ([place]) 01/26/2016   No past surgical history on file.  reports that she quit smoking about 6 years ago. Her smoking use included Cigarettes. She has a 30.00 pack-year smoking history. She has never used smokeless tobacco. She reports that she drinks about 1.2 oz of alcohol per week . She reports that she does not use drugs. family history includes Alzheimer's disease in her mother; Heart disease (age of onset: 51) in her brother and father. No Known Allergies  Review of Systems  Constitutional: Positive for appetite change and chills. Negative for fever and unexpected weight change.  Respiratory: Negative for shortness of breath.   Cardiovascular: Negative for chest pain.  Gastrointestinal: Positive for abdominal pain. Negative for abdominal distention, blood in stool, constipation, diarrhea and vomiting.  Neurological: Negative for dizziness.       Objective:   Physical Exam  Constitutional: She appears well-developed and well-nourished.  Cardiovascular:  Normal rate and regular rhythm.   Pulmonary/Chest: Effort normal and breath sounds normal. No respiratory distress. She has no wheezes. She has no rales.  Abdominal: Soft. She exhibits no mass. There is tenderness. There is no rebound and no guarding.  She has some tenderness which is mild in the left lower quadrant and near the midline.  No guarding. Abdomen soft  Musculoskeletal: She exhibits no edema.       Assessment:     One-day history of abdominal pain which is predominantly left lower quadrant. Suspect acute diverticulitis. No evidence for acute abdomen at this time    Plan:     -Start Cipro 500 mg twice a day for 10 days and Flagyl 500 milligrams 3 times a day for 10 days -Follow-up promptly for any fever or worsening pain  Eulas Post MD [facility]

## 2017-02-14 NOTE — Telephone Encounter (Signed)
Appointment made

## 2017-02-14 NOTE — Progress Notes (Signed)
Pre visit review using our clinic review tool, if applicable. No additional management support is needed unless otherwise documented below in the visit note. 

## 2017-05-16 ENCOUNTER — Other Ambulatory Visit: Payer: Self-pay | Admitting: Family Medicine

## 2017-05-20 LAB — HM MAMMOGRAPHY

## 2017-05-21 ENCOUNTER — Other Ambulatory Visit (HOSPITAL_COMMUNITY): Payer: Self-pay | Admitting: Nurse Practitioner

## 2017-05-25 ENCOUNTER — Encounter: Payer: Self-pay | Admitting: Family Medicine

## 2017-06-09 ENCOUNTER — Other Ambulatory Visit: Payer: Self-pay | Admitting: Family Medicine

## 2017-06-30 ENCOUNTER — Encounter: Payer: Self-pay | Admitting: Family Medicine

## 2017-08-31 ENCOUNTER — Ambulatory Visit (HOSPITAL_COMMUNITY): Payer: 59 | Admitting: Nurse Practitioner

## 2017-09-07 ENCOUNTER — Encounter: Payer: 59 | Admitting: Family Medicine

## 2017-09-20 ENCOUNTER — Encounter: Payer: 59 | Admitting: Family Medicine

## 2017-09-26 ENCOUNTER — Other Ambulatory Visit (HOSPITAL_COMMUNITY): Payer: Self-pay | Admitting: Nurse Practitioner

## 2017-09-27 ENCOUNTER — Ambulatory Visit (INDEPENDENT_AMBULATORY_CARE_PROVIDER_SITE_OTHER): Payer: 59 | Admitting: Family Medicine

## 2017-09-27 ENCOUNTER — Encounter: Payer: Self-pay | Admitting: Family Medicine

## 2017-09-27 VITALS — BP 120/70 | HR 68 | Temp 98.7°F | Ht 67.0 in | Wt 186.2 lb

## 2017-09-27 DIAGNOSIS — Z0000 Encounter for general adult medical examination without abnormal findings: Secondary | ICD-10-CM | POA: Diagnosis not present

## 2017-09-27 LAB — CBC WITH DIFFERENTIAL/PLATELET
BASOS ABS: 0 10*3/uL (ref 0.0–0.1)
Basophils Relative: 0.9 % (ref 0.0–3.0)
EOS PCT: 1.8 % (ref 0.0–5.0)
Eosinophils Absolute: 0.1 10*3/uL (ref 0.0–0.7)
HCT: 40.8 % (ref 36.0–46.0)
Hemoglobin: 13.8 g/dL (ref 12.0–15.0)
LYMPHS ABS: 1.6 10*3/uL (ref 0.7–4.0)
Lymphocytes Relative: 32.1 % (ref 12.0–46.0)
MCHC: 33.8 g/dL (ref 30.0–36.0)
MCV: 93 fl (ref 78.0–100.0)
MONO ABS: 0.5 10*3/uL (ref 0.1–1.0)
Monocytes Relative: 10.1 % (ref 3.0–12.0)
NEUTROS ABS: 2.8 10*3/uL (ref 1.4–7.7)
Neutrophils Relative %: 55.1 % (ref 43.0–77.0)
PLATELETS: 315 10*3/uL (ref 150.0–400.0)
RBC: 4.38 Mil/uL (ref 3.87–5.11)
RDW: 12.6 % (ref 11.5–15.5)
WBC: 5.1 10*3/uL (ref 4.0–10.5)

## 2017-09-27 LAB — LIPID PANEL
Cholesterol: 267 mg/dL — ABNORMAL HIGH (ref 0–200)
HDL: 86 mg/dL (ref >39.00)
LDL Cholesterol: 146 mg/dL — ABNORMAL HIGH (ref 0–99)
NONHDL: 181.21
TRIGLYCERIDES: 175 mg/dL — AB (ref 0.0–149.0)
Total CHOL/HDL Ratio: 3
VLDL: 35 mg/dL (ref 0.0–40.0)

## 2017-09-27 LAB — HEPATIC FUNCTION PANEL
ALBUMIN: 4.2 g/dL (ref 3.5–5.2)
ALT: 15 U/L (ref 0–35)
AST: 16 U/L (ref 0–37)
Alkaline Phosphatase: 70 U/L (ref 39–117)
BILIRUBIN TOTAL: 0.8 mg/dL (ref 0.2–1.2)
Bilirubin, Direct: 0.1 mg/dL (ref 0.0–0.3)
Total Protein: 6.8 g/dL (ref 6.0–8.3)

## 2017-09-27 LAB — BASIC METABOLIC PANEL
BUN: 13 mg/dL (ref 6–23)
CO2: 28 mEq/L (ref 19–32)
Calcium: 9.7 mg/dL (ref 8.4–10.5)
Chloride: 101 mEq/L (ref 96–112)
Creatinine, Ser: 0.64 mg/dL (ref 0.40–1.20)
GFR: 99.61 mL/min (ref >60.00)
GLUCOSE: 84 mg/dL (ref 70–99)
POTASSIUM: 4.8 meq/L (ref 3.5–5.1)
SODIUM: 137 meq/L (ref 135–145)

## 2017-09-27 LAB — TSH: TSH: 1.82 u[IU]/mL (ref 0.35–4.50)

## 2017-09-27 NOTE — Progress Notes (Addendum)
Subjective:     Patient ID: Stephanie [name], female   DOB: 03-16-1956, 61 y.o.   MRN: [id number]  HPI Patient seen for physical exam. She still sees gynecologist yearly. She had recent bone density scan but has not heard back results yet. Her mammograms are up-to-date. Colonoscopy up-to-date. Immunizations up-to-date. Never had hepatitis C antibody screening. She is low risk. Her chronic problems include history of atrial fibrillation, hypertension, hyperlipidemia.  She takes Xarelto and has not had any recent bleeding concerns. She takes metoprolol as needed for tachyarrhythmias but has not had any recently. No recent chest pains.  The 10-year ASCVD risk score Stephanie [name] DC Stephanie [name]., et al., 2013) is: 5.1%   Values used to calculate the score:     Age: 64 years     Sex: Female     Is Non-Hispanic African American: No     Diabetic: No     Tobacco smoker: No     Systolic Blood Pressure: 932 mmHg     Is BP treated: Yes     HDL Cholesterol: 86 mg/dL     Total Cholesterol: 267 mg/dL   Past Medical History:  Diagnosis Date  . Diverticulitis 06/2016  . Hyperlipidemia   . Hypertension   . Paroxysmal atrial fibrillation (Stephanie [name]) 01/26/2016   No past surgical history on file.  reports that she quit smoking about 7 years ago. Her smoking use included cigarettes. She has a 30.00 pack-year smoking history. she has never used smokeless tobacco. She reports that she drinks about 1.2 oz of alcohol per week. She reports that she does not use drugs. family history includes Alzheimer's disease in her mother; Heart disease (age of onset: 20) in her brother and father; Sudden death in her unknown relative. No Known Allergies   Review of Systems  Constitutional: Negative for activity change, appetite change, fatigue, fever and unexpected weight change.  HENT: Negative for ear pain, hearing loss, sore throat and trouble swallowing.   Eyes: Negative for visual disturbance.  Respiratory: Negative for cough and  shortness of breath.   Cardiovascular: Negative for chest pain and palpitations.  Gastrointestinal: Negative for abdominal pain, blood in stool, constipation and diarrhea.  Endocrine: Negative for polydipsia and polyuria.  Genitourinary: Negative for dysuria and hematuria.  Musculoskeletal: Negative for arthralgias, back pain and myalgias.  Skin: Negative for rash.  Neurological: Negative for dizziness, syncope and headaches.  Hematological: Negative for adenopathy.  Psychiatric/Behavioral: Negative for confusion and dysphoric mood.       Objective:   Physical Exam  Constitutional: She is oriented to person, place, and time. She appears well-developed and well-nourished.  HENT:  Head: Normocephalic and atraumatic.  Mouth/Throat: Oropharynx is clear and moist.  Eyes: EOM are normal. Pupils are equal, round, and reactive to light.  Neck: Normal range of motion. Neck supple. No thyromegaly present.  Cardiovascular: Normal rate, regular rhythm and normal heart sounds.  No murmur heard. Pulmonary/Chest: Breath sounds normal. No respiratory distress. She has no wheezes. She has no rales.  Abdominal: Soft. Bowel sounds are normal. She exhibits no distension and no mass. There is no tenderness. There is no rebound and no guarding.  Musculoskeletal: Normal range of motion. She exhibits no edema.  Lymphadenopathy:    She has no cervical adenopathy.  Neurological: She is alert and oriented to person, place, and time. She displays normal reflexes. No cranial nerve deficit.  Skin: No rash noted.  Psychiatric: She has a normal mood and affect. Her behavior is normal. Judgment  and thought content normal.       Assessment:     Physical exam. She has history of atrial fibrillation but appears to be clinically in sinus rhythm today. The following issues were addressed    Plan:     -obtain screening lab work. Include hepatitis C antibody -continue with yearly flu vaccine -Continue with current  medications. -she plans to continue with yearly follow up with gyn.  Stephanie Post MD [facility]

## 2017-09-28 LAB — HEPATITIS C ANTIBODY
HEP C AB: NONREACTIVE
SIGNAL TO CUT-OFF: 0.01 (ref <1.00)

## 2017-10-22 ENCOUNTER — Other Ambulatory Visit (HOSPITAL_COMMUNITY): Payer: Self-pay | Admitting: Nurse Practitioner

## 2017-10-27 ENCOUNTER — Other Ambulatory Visit (HOSPITAL_COMMUNITY): Payer: Self-pay | Admitting: Nurse Practitioner

## 2017-10-27 NOTE — Telephone Encounter (Signed)
Refill for 6 months. 

## 2017-11-27 ENCOUNTER — Other Ambulatory Visit: Payer: Self-pay | Admitting: Family Medicine

## 2017-11-30 ENCOUNTER — Other Ambulatory Visit: Payer: Self-pay | Admitting: Family Medicine

## 2018-01-23 ENCOUNTER — Other Ambulatory Visit (HOSPITAL_COMMUNITY): Payer: Self-pay | Admitting: Nurse Practitioner

## 2018-01-23 NOTE — Telephone Encounter (Signed)
Refill for one year 

## 2018-02-27 ENCOUNTER — Encounter (HOSPITAL_COMMUNITY): Payer: Self-pay | Admitting: Nurse Practitioner

## 2018-02-27 ENCOUNTER — Ambulatory Visit (HOSPITAL_COMMUNITY)
Admission: RE | Admit: 2018-02-27 | Discharge: 2018-02-27 | Disposition: A | Discharge status: Home / Self Care | Payer: 59 | Source: Ambulatory Visit | Attending: Nurse Practitioner | Admitting: Nurse Practitioner

## 2018-02-27 VITALS — BP 112/64 | HR 77 | Ht 67.0 in | Wt 183.8 lb

## 2018-02-27 DIAGNOSIS — Z7901 Long term (current) use of anticoagulants: Secondary | ICD-10-CM | POA: Diagnosis not present

## 2018-02-27 DIAGNOSIS — Z87891 Personal history of nicotine dependence: Secondary | ICD-10-CM | POA: Insufficient documentation

## 2018-02-27 DIAGNOSIS — I480 Paroxysmal atrial fibrillation: Secondary | ICD-10-CM

## 2018-02-27 DIAGNOSIS — Z79899 Other long term (current) drug therapy: Secondary | ICD-10-CM | POA: Diagnosis not present

## 2018-02-27 DIAGNOSIS — R0683 Snoring: Secondary | ICD-10-CM

## 2018-02-27 DIAGNOSIS — E785 Hyperlipidemia, unspecified: Secondary | ICD-10-CM | POA: Insufficient documentation

## 2018-02-27 DIAGNOSIS — I10 Essential (primary) hypertension: Secondary | ICD-10-CM | POA: Insufficient documentation

## 2018-02-27 LAB — BASIC METABOLIC PANEL
ANION GAP: 8 (ref 5–15)
BUN: 11 mg/dL (ref 6–20)
CALCIUM: 9.4 mg/dL (ref 8.9–10.3)
CO2: 22 mmol/L (ref 22–32)
Chloride: 106 mmol/L (ref 101–111)
Creatinine, Ser: 0.82 mg/dL (ref 0.44–1.00)
GFR calc Af Amer: >60 mL/min (ref >60)
Glucose, Bld: 138 mg/dL — ABNORMAL HIGH (ref 65–99)
POTASSIUM: 4.2 mmol/L (ref 3.5–5.1)
SODIUM: 136 mmol/L (ref 135–145)

## 2018-02-27 LAB — TSH: TSH: 1.956 u[IU]/mL (ref 0.350–4.500)

## 2018-02-27 LAB — MAGNESIUM: MAGNESIUM: 1.9 mg/dL (ref 1.7–2.4)

## 2018-02-27 NOTE — Progress Notes (Signed)
Primary Care Physician: Eulas Post, MD Referring Physician: Dr. Jolyn Nap   Stephanie [name] is a 62 y.o. female with a h/o abrupt onset tachy palpitations, dx per Dr. Caryl Comes as PAF, 4/17. She was started on BB and xarelto and referred to the afib clinic for f/u. She is doing well on blood thinner without issues. She has not noted any further afib on BB, but feels as though since she has been on drug that her mood is depressed. She is also having difficulty remembering to take DOAC 2x a day.  Review of triggers for afib reveal that pt drinks beer nightly, large amount of caffeine, does not exercise and is overweight. She does snore but does not believe she has apnea, and denies daytime somnolence.   She returns to the afib clinic for f/u 12/14. On last visit BB was changed to cardizem and eliquis was changed to once daily xarelto. She reports no afb burden.  No bleeding issues.  F/u in afib clinic, 02/27/18, for more issues with anxiety/ panic attacks and sensation of heart irregularity. She is thinking about quitting work and is not Medicare age. She is concerned re what she might do for insurance.SHe feels Eliquis may be too expensive for her as she loses insurance. She is really clear if panic attacks are triggering irregular heart rhythm. She drinks alcohol nightly and drinks many diet cokes a day. She does snore. Some of her spells come on at night.   Today, she denies symptoms of palpitations, chest pain, shortness of breath, orthopnea, PND, lower extremity edema, dizziness, presyncope, syncope, or neurologic sequela. The patient is tolerating medications without difficulties and is otherwise without complaint today.   Past Medical History:  Diagnosis Date  . Diverticulitis 06/2016  . Hyperlipidemia   . Hypertension   . Paroxysmal atrial fibrillation ([place]) 01/26/2016   No past surgical history on file.  Current Outpatient Medications  Medication Sig Dispense Refill  .  cholecalciferol (VITAMIN D) 1000 UNITS tablet Take 1,000 Units by mouth daily.      Marland Kitchen diltiazem (CARTIA XT) 120 MG 24 hr capsule Take 1 capsule (120 mg total) by mouth daily. 90 capsule 3  . fish oil-omega-3 fatty acids 1000 MG capsule Take 2 g by mouth daily.      Marland Kitchen FLUoxetine (PROZAC) 20 MG capsule TAKE 1 CAPSULE DAILY 90 capsule 1  . metoprolol succinate (TOPROL-XL) 50 MG 24 hr tablet Take 1/2 tablet by mouth if needed for breakthrough afib for HR >100 as long as BP > 100 30 tablet 9  . simvastatin (ZOCOR) 10 MG tablet TAKE 1 TABLET AT BEDTIME 90 tablet 2  . XARELTO 20 MG TABS tablet TAKE 1 TABLET BY MOUTH ONCE DAILY WITH SUPPER (FUTURE REFILLS FROM PRIMARY PHYSICIAN) 90 tablet 1   Current Facility-Administered Medications  Medication Dose Route Frequency Provider Last Rate Last Dose  . 0.9 %  sodium chloride infusion  500 mL Intravenous Continuous Nandigam, Venia Minks, MD        No Known Allergies  Social History   Socioeconomic History  . Marital status: Married    Spouse name: Not on file  . Number of children: 2  . Years of education: Not on file  . Highest education level: Not on file  Occupational History  . Not on file  Social Needs  . Financial resource strain: Not on file  . Food insecurity:    Worry: Not on file    Inability: Not on  file  . Transportation needs:    Medical: Not on file    Non-medical: Not on file  Tobacco Use  . Smoking status: Former Smoker    Packs/day: 1.00    Years: 30.00    Pack years: 30.00    Types: Cigarettes    Last attempt to quit: 05/31/2010    Years since quitting: 7.7  . Smokeless tobacco: Never Used  Substance and Sexual Activity  . Alcohol use: Yes    Alcohol/week: 1.2 oz    Types: 2 Cans of beer per week  . Drug use: No  . Sexual activity: Not on file  Lifestyle  . Physical activity:    Days per week: Not on file    Minutes per session: Not on file  . Stress: Not on file  Relationships  . Social connections:    Talks  on phone: Not on file    Gets together: Not on file    Attends religious service: Not on file    Active member of club or organization: Not on file    Attends meetings of clubs or organizations: Not on file    Relationship status: Not on file  . Intimate partner violence:    Fear of current or ex partner: Not on file    Emotionally abused: Not on file    Physically abused: Not on file    Forced sexual activity: Not on file  Other Topics Concern  . Not on file  Social History Narrative  . Not on file    Family History  Problem Relation Age of Onset  . Heart disease Father 66       CAD  . Sudden death Unknown        family hx  . Alzheimer's disease Mother   . Heart disease Brother 3       CAD    ROS- All systems are reviewed and negative except as per the HPI above  Physical Exam: Vitals:   02/27/18 1456  BP: 112/64  Pulse: 77  SpO2: 96%  Weight: 183 lb 12.8 oz (83.4 kg)  Height: 5\' 7"  (1.702 m)    GEN- The patient is well appearing, alert and oriented x 3 today.   Head- normocephalic, atraumatic Eyes-  Sclera clear, conjunctiva pink Ears- hearing intact Oropharynx- clear Neck- supple, no JVP Lymph- no cervical lymphadenopathy Lungs- Clear to ausculation bilaterally, normal work of breathing Heart- Regular rate and rhythm, no murmurs, rubs or gallops, PMI not laterally displaced GI- soft, NT, ND, + BS Extremities- no clubbing, cyanosis, or edema MS- no significant deformity or atrophy Skin- no rash or lesion Psych- euthymic mood, full affect Neuro- strength and sensation are intact  EKG- Sinus rhythm at 77 bpm, with groups of PAC's Epic records reviewed Echo- 2017-Left ventricle: The cavity size was normal. Wall thickness was   normal. Systolic function was normal. The estimated ejection   fraction was in the range of 55% to 60%. Wall motion was normal;   there were no regional wall motion abnormalities. Left   ventricular diastolic function parameters  were normal.  Impressions:  - Normal LV function; trace MR and TR.   Assessment and Plan: 1. PAF  Increase of heart irregularity, hard to tease out as she is having panic attacks as well Continue  cardizem 120 mg a day Has prn BB to use if needed  Continue xarelto 20 mg a day for chadsvasc score of 2(HTN, Female) Encouraged to work on Lifestyle and  reduce alcohol to no more than 2 drinks a night and reduce caffeine intake Sleep study orfered Encouraged exercise program and calorie restriction for weight loss  F/u afib clinic  In 2 weeks for further evaluation of effects of above  Butch Penny C. Rielly Brunn, [facility] [address] [place], [place] [address] [phone]

## 2018-02-27 NOTE — Patient Instructions (Signed)
Work on reducing caffeine and alcohol intake  Scheduling will be in touch with you for sleep study once preauthorization from insurance is complete

## 2018-03-01 ENCOUNTER — Telehealth: Payer: Self-pay | Admitting: *Deleted

## 2018-03-01 NOTE — Telephone Encounter (Signed)
PA submitted to [facility] for in lad sleep study.

## 2018-03-01 NOTE — Telephone Encounter (Signed)
-----   Message from Freada Bergeron, [place] sent at 02/28/2018 10:42 AM EDT ----- Regarding: pre cert   ----- Message ----- From: Juluis Mire, RN Sent: 02/27/2018   3:29 PM To: Freada Bergeron, CMA Subject: sleep study                                    Pt needs sleep study for snoring. Daytime drowsiness Thanks Marzetta Board

## 2018-03-08 ENCOUNTER — Ambulatory Visit (HOSPITAL_COMMUNITY): Payer: 59 | Admitting: Nurse Practitioner

## 2018-03-14 ENCOUNTER — Ambulatory Visit (HOSPITAL_COMMUNITY): Payer: 59 | Admitting: Nurse Practitioner

## 2018-03-15 ENCOUNTER — Telehealth: Payer: Self-pay | Admitting: *Deleted

## 2018-03-15 ENCOUNTER — Other Ambulatory Visit: Payer: Self-pay | Admitting: Cardiovascular Disease

## 2018-03-15 DIAGNOSIS — I480 Paroxysmal atrial fibrillation: Secondary | ICD-10-CM

## 2018-03-15 DIAGNOSIS — R0683 Snoring: Secondary | ICD-10-CM

## 2018-03-15 DIAGNOSIS — I10 Essential (primary) hypertension: Secondary | ICD-10-CM

## 2018-03-15 NOTE — Telephone Encounter (Signed)
In lab sleep study denied by [facility]. Order changed to [place]. Patient notified of HST appointment date and time.

## 2018-03-15 NOTE — Telephone Encounter (Signed)
-----   Message from Freada Bergeron, [place] sent at 02/28/2018 10:42 AM EDT ----- Regarding: pre cert   ----- Message ----- From: Juluis Mire, RN Sent: 02/27/2018   3:29 PM To: Freada Bergeron, CMA Subject: sleep study                                    Pt needs sleep study for snoring. Daytime drowsiness Thanks Marzetta Board

## 2018-03-15 NOTE — Telephone Encounter (Signed)
Patient notified of HST appointment scheduled at [facility].

## 2018-04-14 ENCOUNTER — Other Ambulatory Visit (HOSPITAL_COMMUNITY): Payer: Self-pay | Admitting: *Deleted

## 2018-04-14 MED ORDER — RIVAROXABAN 20 MG PO TABS: 20.0000 mg | ORAL_TABLET | Freq: Every day | ORAL | 1 refills | Status: DC

## 2018-04-18 ENCOUNTER — Encounter (HOSPITAL_BASED_OUTPATIENT_CLINIC_OR_DEPARTMENT_OTHER): Payer: 59

## 2018-05-06 ENCOUNTER — Encounter (HOSPITAL_COMMUNITY): Payer: Self-pay

## 2018-05-06 ENCOUNTER — Emergency Department (HOSPITAL_COMMUNITY)
Admission: EM | Admit: 2018-05-06 | Discharge: 2018-05-06 | Disposition: A | Discharge status: Home / Self Care | Payer: 59 | Attending: Emergency Medicine | Admitting: Emergency Medicine

## 2018-05-06 ENCOUNTER — Emergency Department (HOSPITAL_COMMUNITY): Payer: 59

## 2018-05-06 ENCOUNTER — Other Ambulatory Visit: Payer: Self-pay

## 2018-05-06 DIAGNOSIS — I480 Paroxysmal atrial fibrillation: Secondary | ICD-10-CM | POA: Diagnosis not present

## 2018-05-06 DIAGNOSIS — I10 Essential (primary) hypertension: Secondary | ICD-10-CM | POA: Insufficient documentation

## 2018-05-06 DIAGNOSIS — Z79899 Other long term (current) drug therapy: Secondary | ICD-10-CM | POA: Insufficient documentation

## 2018-05-06 DIAGNOSIS — R002 Palpitations: Secondary | ICD-10-CM

## 2018-05-06 DIAGNOSIS — Z87891 Personal history of nicotine dependence: Secondary | ICD-10-CM | POA: Diagnosis not present

## 2018-05-06 DIAGNOSIS — R000 Tachycardia, unspecified: Secondary | ICD-10-CM | POA: Diagnosis present

## 2018-05-06 DIAGNOSIS — I4891 Unspecified atrial fibrillation: Secondary | ICD-10-CM

## 2018-05-06 LAB — COMPREHENSIVE METABOLIC PANEL
ALBUMIN: 4.1 g/dL (ref 3.5–5.0)
ALT: 16 U/L (ref 0–44)
AST: 25 U/L (ref 15–41)
Alkaline Phosphatase: 86 U/L (ref 38–126)
Anion gap: 9 (ref 5–15)
BUN: 15 mg/dL (ref 8–23)
CO2: 25 mmol/L (ref 22–32)
Calcium: 9.8 mg/dL (ref 8.9–10.3)
Chloride: 102 mmol/L (ref 98–111)
Creatinine, Ser: 0.92 mg/dL (ref 0.44–1.00)
GFR calc Af Amer: >60 mL/min (ref >60)
GFR calc non Af Amer: >60 mL/min (ref >60)
GLUCOSE: 117 mg/dL — AB (ref 70–99)
POTASSIUM: 3.8 mmol/L (ref 3.5–5.1)
SODIUM: 136 mmol/L (ref 135–145)
Total Bilirubin: 0.8 mg/dL (ref 0.3–1.2)
Total Protein: 7.3 g/dL (ref 6.5–8.1)

## 2018-05-06 LAB — CBC
HEMATOCRIT: 45.3 % (ref 36.0–46.0)
HEMOGLOBIN: 15.3 g/dL — AB (ref 12.0–15.0)
MCH: 30.8 pg (ref 26.0–34.0)
MCHC: 33.8 g/dL (ref 30.0–36.0)
MCV: 91.1 fL (ref 78.0–100.0)
Platelets: 347 10*3/uL (ref 150–400)
RBC: 4.97 MIL/uL (ref 3.87–5.11)
RDW: 11.7 % (ref 11.5–15.5)
WBC: 9.1 10*3/uL (ref 4.0–10.5)

## 2018-05-06 LAB — I-STAT TROPONIN, ED: TROPONIN I, POC: 0 ng/mL (ref 0.00–0.08)

## 2018-05-06 MED ORDER — DILTIAZEM LOAD VIA INFUSION
20.0000 mg | Freq: Once | INTRAVENOUS | Status: AC
  Administered 2018-05-06: 20 mg via INTRAVENOUS
  Filled 2018-05-06: qty 20

## 2018-05-06 MED ORDER — SODIUM CHLORIDE 0.9 % IV SOLN
INTRAVENOUS | Status: DC
  Administered 2018-05-06: 15 mL/h via INTRAVENOUS

## 2018-05-06 MED ORDER — DILTIAZEM HCL-DEXTROSE 100-5 MG/100ML-% IV SOLN (PREMIX)
5.0000 mg/h | INTRAVENOUS | Status: DC
  Administered 2018-05-06: 5 mg/h via INTRAVENOUS
  Filled 2018-05-06: qty 100

## 2018-05-06 NOTE — Discharge Instructions (Addendum)
It was our pleasure to provide your ER care today - we hope that you feel better.  Continue your current medication.  Follow up with your cardiologist this week - call office Monday AM to arrange appointment.   Return to ER if worse, new symptoms, recurrent/persistent chest pain, trouble breathing, weak/fainting, other concern.

## 2018-05-06 NOTE — ED Provider Notes (Signed)
[facility] Provider Note   CSN: [id number] Arrival date & time: 05/06/18  1702     History   Chief Complaint Chief Complaint  Patient presents with  . Tachycardia    HPI Stephanie [name] is a 62 y.o. female.  Patient c/o palpitations onset this AM. Hx afib. Symptoms moderate, persistent, waxing and waning. Denies chest pain. No sob. No syncope. States hx same symptoms in past, was told a fib. States compliant w home meds. States felt fine recently up until onset palpitations this AM. No recent chest pain or discomfort, no unusual doe or fatigue. Denies fever or chills. Normal appetite. Compliant w home meds.   The history is provided by the patient and the EMS personnel.    Past Medical History:  Diagnosis Date  . Diverticulitis 06/2016  . Hyperlipidemia   . Hypertension   . Paroxysmal atrial fibrillation ([place]) 01/26/2016    Patient Active Problem List   Diagnosis Date Noted  . Paroxysmal atrial fibrillation ([place]) 01/26/2016  . HYPERLIPIDEMIA 09/01/2009  . TOBACCO ABUSE 09/01/2009  . Essential hypertension 09/01/2009    No past surgical history on file.   OB History   None      Home Medications    Prior to Admission medications   Medication Sig Start Date End Date Taking? Authorizing Provider  cholecalciferol (VITAMIN D) 1000 UNITS tablet Take 1,000 Units by mouth daily.      [provider]  diltiazem (CARTIA XT) 120 MG 24 hr capsule Take 1 capsule (120 mg total) by mouth daily. 01/24/18   Burchette, Alinda Sierras, MD  fish oil-omega-3 fatty acids 1000 MG capsule Take 2 g by mouth daily.      [provider]  FLUoxetine (PROZAC) 20 MG capsule TAKE 1 CAPSULE DAILY 11/28/17   Burchette, Alinda Sierras, MD  metoprolol succinate (TOPROL-XL) 50 MG 24 hr tablet Take 1/2 tablet by mouth if needed for breakthrough afib for HR >100 as long as BP > 100 05/25/16   Sherran Needs, NP  rivaroxaban (XARELTO) 20 MG TABS tablet Take 1  tablet (20 mg total) by mouth daily with supper. 04/14/18   Sherran Needs, NP  simvastatin (ZOCOR) 10 MG tablet TAKE 1 TABLET AT BEDTIME 11/30/17   Burchette, Alinda Sierras, MD    Family History Family History  Problem Relation Age of Onset  . Heart disease Father 15       CAD  . Sudden death Unknown        family hx  . Alzheimer's disease Mother   . Heart disease Brother 70       CAD    Social History Social History   Tobacco Use  . Smoking status: Former Smoker    Packs/day: 1.00    Years: 30.00    Pack years: 30.00    Types: Cigarettes    Last attempt to quit: 05/31/2010    Years since quitting: 7.9  . Smokeless tobacco: Never Used  Substance Use Topics  . Alcohol use: Yes    Alcohol/week: 1.2 oz    Types: 2 Cans of beer per week  . Drug use: No     Allergies   Patient has no known allergies.   Review of Systems Review of Systems  Constitutional: Negative for chills and fever.  HENT: Negative for sore throat.   Eyes: Negative for redness.  Respiratory: Negative for shortness of breath.   Cardiovascular: Positive for palpitations. Negative for chest pain.  Gastrointestinal: Negative for abdominal pain.  Genitourinary: Negative for flank pain.  Musculoskeletal: Negative for back pain and neck pain.  Skin: Negative for rash.  Neurological: Negative for syncope and headaches.  Hematological: Does not bruise/bleed easily.  Psychiatric/Behavioral: Negative for confusion.     Physical Exam Updated Vital Signs There were no vitals taken for this visit.  Physical Exam  Constitutional: She appears well-developed and well-nourished.  HENT:  Mouth/Throat: Oropharynx is clear and moist.  Eyes: Conjunctivae are normal. No scleral icterus.  Neck: Neck supple. No tracheal deviation present. No thyromegaly present.  Cardiovascular: Normal rate, regular rhythm, normal heart sounds and intact distal pulses. Exam reveals no gallop and no friction rub.  No murmur  heard. Pulmonary/Chest: Effort normal and breath sounds normal. No respiratory distress.  Abdominal: Soft. Normal appearance and bowel sounds are normal. She exhibits no distension. There is no tenderness.  Musculoskeletal: She exhibits no edema or tenderness.  Neurological: She is alert.  Skin: Skin is warm and dry. No rash noted. She is not diaphoretic.  Psychiatric: She has a normal mood and affect.  Nursing note and vitals reviewed.    ED Treatments / Results  Labs (all labs ordered are listed, but only abnormal results are displayed) Results for orders placed or performed during the hospital encounter of 05/06/18  CBC  Result Value Ref Range   WBC 9.1 4.0 - 10.5 K/uL   RBC 4.97 3.87 - 5.11 MIL/uL   Hemoglobin 15.3 (H) 12.0 - 15.0 g/dL   HCT 45.3 36.0 - 46.0 %   MCV 91.1 78.0 - 100.0 fL   MCH 30.8 26.0 - 34.0 pg   MCHC 33.8 30.0 - 36.0 g/dL   RDW 11.7 11.5 - 15.5 %   Platelets 347 150 - 400 K/uL  Comprehensive metabolic panel  Result Value Ref Range   Sodium 136 135 - 145 mmol/L   Potassium 3.8 3.5 - 5.1 mmol/L   Chloride 102 98 - 111 mmol/L   CO2 25 22 - 32 mmol/L   Glucose, Bld 117 (H) 70 - 99 mg/dL   BUN 15 8 - 23 mg/dL   Creatinine, Ser 0.92 0.44 - 1.00 mg/dL   Calcium 9.8 8.9 - 10.3 mg/dL   Total Protein 7.3 6.5 - 8.1 g/dL   Albumin 4.1 3.5 - 5.0 g/dL   AST 25 15 - 41 U/L   ALT 16 0 - 44 U/L   Alkaline Phosphatase 86 38 - 126 U/L   Total Bilirubin 0.8 0.3 - 1.2 mg/dL   GFR calc non Af Amer >60 >60 mL/min   GFR calc Af Amer >60 >60 mL/min   Anion gap 9 5 - 15  I-stat troponin, ED  Result Value Ref Range   Troponin i, poc 0.00 0.00 - 0.08 ng/mL   Comment 3           Dg Chest Port 1 View  Result Date: 05/06/2018 CLINICAL DATA:  Palpitation EXAM: PORTABLE CHEST 1 VIEW COMPARISON:  None. FINDINGS: No acute opacity or pleural effusion. Cardiomediastinal silhouette within normal limits. No pneumothorax. IMPRESSION: No active disease. Electronically Signed   By:  Donavan Foil M.D.   On: 05/06/2018 18:16    EKG EKG Interpretation  Date/Time:  Saturday May 06 2018 17:13:26 EDT Ventricular Rate:  185 PR Interval:    QRS Duration: 86 QT Interval:  268 QTC Calculation: 471 R Axis:   79 Text Interpretation:  Atrial fibrillation with rapid V-rate LVH with secondary repolarization abnormality Confirmed  by Lajean Saver [phone]) on 05/06/2018 5:15:52 PM   Radiology Dg Chest Port 1 View  Result Date: 05/06/2018 CLINICAL DATA:  Palpitation EXAM: PORTABLE CHEST 1 VIEW COMPARISON:  None. FINDINGS: No acute opacity or pleural effusion. Cardiomediastinal silhouette within normal limits. No pneumothorax. IMPRESSION: No active disease. Electronically Signed   By: Donavan Foil M.D.   On: 05/06/2018 18:16    Procedures Procedures (including critical care time)  Medications Ordered in ED Medications - No data to display   Initial Impression / Assessment and Plan / ED Course  I have reviewed the triage vital signs and the nursing notes.  Pertinent labs & imaging results that were available during my care of the patient were reviewed by me and considered in my medical decision making (see chart for details).  Iv ns. Continuous pulse ox and monitor. Ecg. Cxr. Labs.  Reviewed nursing notes and prior charts for additional history.   On review cardiology note - it appears they feel episodes multi-factorial, including episodic etoh use, mod-heavy caffeine use, stress/anxiety.   Pt is on cardizem - will give bolus.  Pt now in sinus rhythm. No chest pain or sob.   cardizem gtt d/c'd. Remains in sinus. No c/o.   Labs reviewed - chem normal.   Imaging reviewed - no pna.  Pt remains asymptomatic and stable for d/c.      Final Clinical Impressions(s) / ED Diagnoses   Final diagnoses:  None    ED Discharge Orders    None       Lajean Saver, MD 05/06/18 [phone]

## 2018-05-06 NOTE — ED Notes (Signed)
ED Provider at bedside. 

## 2018-05-06 NOTE — ED Triage Notes (Signed)
Pt from home home via [place]; pt feeling anxious around 0800; c/o weakness, anxiety; pt noted to be in SVT w/ EMS; no c/o CP, SOB; Hx afib and anxiety; pt on Xarleto for afib  HR 200 BP 150/63 RR 22 O2 98% RA CBG 116  150 amiodarone PTA

## 2018-05-10 ENCOUNTER — Encounter: Payer: Self-pay | Admitting: Nurse Practitioner

## 2018-05-10 ENCOUNTER — Ambulatory Visit: Payer: 59 | Admitting: Nurse Practitioner

## 2018-05-10 ENCOUNTER — Telehealth: Payer: Self-pay | Admitting: *Deleted

## 2018-05-10 VITALS — BP 150/80 | HR 63 | Ht 68.0 in | Wt 186.8 lb

## 2018-05-10 DIAGNOSIS — F1010 Alcohol abuse, uncomplicated: Secondary | ICD-10-CM

## 2018-05-10 DIAGNOSIS — I480 Paroxysmal atrial fibrillation: Secondary | ICD-10-CM

## 2018-05-10 DIAGNOSIS — R0683 Snoring: Secondary | ICD-10-CM

## 2018-05-10 DIAGNOSIS — E663 Overweight: Secondary | ICD-10-CM

## 2018-05-10 DIAGNOSIS — I10 Essential (primary) hypertension: Secondary | ICD-10-CM | POA: Diagnosis not present

## 2018-05-10 MED ORDER — DILTIAZEM HCL ER COATED BEADS 180 MG PO CP24: 180.0000 mg | ORAL_CAPSULE | Freq: Every day | ORAL | 6 refills | Status: DC

## 2018-05-10 NOTE — Telephone Encounter (Signed)
-----   Message from Claude Manges, [place] sent at 05/10/2018 12:00 PM EDT ----- Regarding: Stephanie [name]

## 2018-05-10 NOTE — Progress Notes (Signed)
Primary Care Physician: Eulas Post, MD Primary Electrophysiologist: Lyrah Bradt is a 62 y.o. female with a history of paroxysmal atrial fibrillation seen today for Dr Caryl Comes.  The patient was initially diagnosed with atrial fibrillation 01/2016 after presenting with symptoms of palpitations.  She has been followed in the AF clinic and sleep study was recommended but not done. She presented to the ER 05/06/18 with recurrent AF and converted to SR with Cardizem drip. She was asked to follow up today.  Today, she denies symptoms of chest pain, shortness of breath, orthopnea, PND, lower extremity edema, dizziness, presyncope, syncope, snoring, daytime somnolence, bleeding, or neurologic sequela. The patient is tolerating medications without difficulties and is otherwise without complaint today.    Atrial Fibrillation Risk Factors:  she does have symptoms or diagnosis of sleep apnea. [employer] denied lab sleep study. Home sleep study was ordered but never done. She is willing to pursue  she does not have a history of rheumatic fever.  she does have a history of alcohol use (drinks up to 4 beers/night).  she has a BMI of Body mass index is 28.4 kg/m.Marland Kitchen Filed Weights   05/10/18 1117  Weight: 186 lb 12.8 oz (84.7 kg)    LA size: 43   Atrial Fibrillation Management history:  Previous antiarrhythmic drugs: none  Previous cardioversions: none  Previous ablations: none  CHADS2VASC score: 2  Anticoagulation history: Xarelto   Past Medical History:  Diagnosis Date  . Diverticulitis 06/2016  . Hyperlipidemia   . Hypertension   . Paroxysmal atrial fibrillation ([place]) 01/26/2016   History reviewed. No pertinent surgical history.  Current Outpatient Medications  Medication Sig Dispense Refill  . cholecalciferol (VITAMIN D) 1000 UNITS tablet Take 1,000 Units by mouth daily.      Marland Kitchen diltiazem (CARDIZEM CD) 180 MG 24 hr capsule Take 1 capsule (180 mg total) by mouth daily. 30  capsule 6  . fish oil-omega-3 fatty acids 1000 MG capsule Take 2 g by mouth daily.      Marland Kitchen FLUoxetine (PROZAC) 20 MG capsule TAKE 1 CAPSULE DAILY 90 capsule 1  . metoprolol succinate (TOPROL-XL) 50 MG 24 hr tablet Take 1/2 tablet by mouth if needed for breakthrough afib for HR >100 as long as BP > 100 30 tablet 9  . rivaroxaban (XARELTO) 20 MG TABS tablet Take 1 tablet (20 mg total) by mouth daily with supper. 90 tablet 1  . simvastatin (ZOCOR) 10 MG tablet TAKE 1 TABLET AT BEDTIME 90 tablet 2   Current Facility-Administered Medications  Medication Dose Route Frequency Provider Last Rate Last Dose  . 0.9 %  sodium chloride infusion  500 mL Intravenous Continuous Nandigam, Venia Minks, MD        No Known Allergies  Social History   Socioeconomic History  . Marital status: Married    Spouse name: Not on file  . Number of children: 2  . Years of education: Not on file  . Highest education level: Not on file  Occupational History  . Not on file  Social Needs  . Financial resource strain: Not on file  . Food insecurity:    Worry: Not on file    Inability: Not on file  . Transportation needs:    Medical: Not on file    Non-medical: Not on file  Tobacco Use  . Smoking status: Former Smoker    Packs/day: 1.00    Years: 30.00    Pack years: 30.00    Types:  Cigarettes    Last attempt to quit: 05/31/2010    Years since quitting: 7.9  . Smokeless tobacco: Never Used  Substance and Sexual Activity  . Alcohol use: Yes    Alcohol/week: 1.2 oz    Types: 2 Cans of beer per week  . Drug use: No  . Sexual activity: Not on file  Lifestyle  . Physical activity:    Days per week: Not on file    Minutes per session: Not on file  . Stress: Not on file  Relationships  . Social connections:    Talks on phone: Not on file    Gets together: Not on file    Attends religious service: Not on file    Active member of club or organization: Not on file    Attends meetings of clubs or  organizations: Not on file    Relationship status: Not on file  . Intimate partner violence:    Fear of current or ex partner: Not on file    Emotionally abused: Not on file    Physically abused: Not on file    Forced sexual activity: Not on file  Other Topics Concern  . Not on file  Social History Narrative  . Not on file    Family History  Problem Relation Age of Onset  . Heart disease Father 75       CAD  . Sudden death Unknown        family hx  . Alzheimer's disease Mother   . Heart disease Brother 44       CAD   The patient does not have a history of early familial atrial fibrillation or other arrhythmias.  ROS- All systems are reviewed and negative except as per the HPI above.  Physical Exam: Vitals:   05/10/18 1117  BP: (!) 150/80  Pulse: 63  SpO2: 96%  Weight: 186 lb 12.8 oz (84.7 kg)  Height: 5\' 8"  (1.727 m)    GEN- The patient is well appearing, alert and oriented x 3 today.   Head- normocephalic, atraumatic Eyes-  Sclera clear, conjunctiva pink Ears- hearing intact Oropharynx- clear Neck- supple  Lungs- Clear to ausculation bilaterally, normal work of breathing Heart- Regular rate and rhythm  GI- soft, NT, ND, + BS Extremities- no clubbing, cyanosis, or edema MS- no significant deformity or atrophy Skin- no rash or lesion Psych- euthymic mood, full affect Neuro- strength and sensation are intact  Wt Readings from Last 3 Encounters:  05/10/18 186 lb 12.8 oz (84.7 kg)  05/06/18 180 lb (81.6 kg)  02/27/18 183 lb 12.8 oz (83.4 kg)    Echo 12/2015 demonstrated EF 55-60%, no RWMA, LA 43  Epic records are reviewed at length today  Assessment and Plan:  1. Paroxysmal atrial fibrillation The patient has symptomatic paroxysmal atrial fibrillation. She has been managed in the past with Diltiazem and prn Toprol.  Continue Xarelto for CHADS2VASC of 2 We have significant opportunity for lifestyle modification. I feel strongly that she needs a sleep  study. Will re-refer and see if we can get arranged Encouraged to limit ETOH use significantly - she will try to limit to 2 beers/night.  We discussed escalation of therapy including AADs. For now, will increase Cardizem to 180mg  daily  2. Obesity Body mass index is 28.4 kg/m. Regular exercise and weight loss recommended.  3. Sleep disordered breathing Sleep study re-ordered  4.  ETOH As above   Follow up with me in 6 weeks  Chanetta Marshall,  NP 05/10/2018 1:16 PM

## 2018-05-10 NOTE — Telephone Encounter (Signed)
Staff message sent to Stephanie [name] ok to schedule HST. [employer] does not require PA.

## 2018-05-10 NOTE — Patient Instructions (Addendum)
Medication Instructions:    START TAKING CARDIZEM 180 MG ONCE A DAY   If you need a refill on your cardiac medications before your next appointment, please call your pharmacy.  Labwork: NONE ORDERED  TODAY    Testing/Procedures: Your physician has recommended that you have a  (HOME )sleep study. This test records several body functions during sleep, including: brain activity, eye movement, oxygen and carbon dioxide blood levels, heart rate and rhythm, breathing rate and rhythm, the flow of air through your mouth and nose, snoring, body muscle movements, and chest and belly movement.  Some will contact you with further steps   Follow-Up: 6 WEEKS WITH SEILER    Any Other Special Instructions Will Be Listed Below (If Applicable).  PLEASE  DECREASE DRINKING TO 2 DRINKS PER NIGHT

## 2018-05-16 ENCOUNTER — Telehealth: Payer: Self-pay | Admitting: *Deleted

## 2018-05-16 NOTE — Telephone Encounter (Signed)
Patient is aware and agreeable to Home Sleep Study through [facility]. Patient is scheduled for WED 8/28 at 12 PM to pick up home sleep kit and meet with Respiratory therapist at [facility]. Patient is aware that if this appointment date and time does not work for them they should contact Artis Delay directly at [phone]. Patient is aware that a sleep packet will be sent from [facility] in week. Patient is agreeable to treatment and thankful for call.

## 2018-05-16 NOTE — Telephone Encounter (Signed)
-----   Message from Lauralee Evener, [place] sent at 05/10/2018 12:38 PM EDT ----- Regarding: RE: PRE CERT Ok to schedule HST. No PA is required. ----- Message ----- From: Claude Manges, CMA Sent: 05/10/2018  12:00 PM To: Freada Bergeron, CMA, Cv Div Sleep Studies Subject: PRE [place]

## 2018-06-02 ENCOUNTER — Other Ambulatory Visit: Payer: Self-pay | Admitting: Internal Medicine

## 2018-06-06 ENCOUNTER — Other Ambulatory Visit: Payer: Self-pay | Admitting: Family Medicine

## 2018-06-07 ENCOUNTER — Ambulatory Visit (HOSPITAL_BASED_OUTPATIENT_CLINIC_OR_DEPARTMENT_OTHER): Payer: 59 | Attending: Nurse Practitioner | Admitting: Cardiology

## 2018-06-07 DIAGNOSIS — R0683 Snoring: Secondary | ICD-10-CM

## 2018-06-07 DIAGNOSIS — G4733 Obstructive sleep apnea (adult) (pediatric): Secondary | ICD-10-CM

## 2018-06-11 NOTE — Procedures (Signed)
   Patient Name: Stephanie [name], Stephanie [name] Date: 06/07/2018 Gender: Female D.O.B: June 17, 1956 Age (years): 73 Referring Provider: Chanetta Marshall Height (inches): 85 Interpreting Physician: Fransico Him MD, ABSM Weight (lbs): 180 RPSGT: Jonna Coup BMI: 28 MRN: [id number] Neck Size: 14.00  CLINICAL INFORMATION Sleep Study Type: HST  Indication for sleep study: Snoring  Epworth Sleepiness Score: 2  SLEEP STUDY TECHNIQUE A multi-channel overnight portable sleep study was performed. The channels recorded were: nasal airflow, thoracic respiratory movement, and oxygen saturation with a pulse oximetry. Snoring was also monitored.  MEDICATIONS Patient self administered medications include: N/A.  SLEEP ARCHITECTURE Patient was studied for 482.5 minutes. The sleep efficiency was 98.5 % and the patient was supine for 8.9%. The arousal index was 0.0 per hour.  RESPIRATORY PARAMETERS The overall AHI was 7.5 per hour, with a central apnea index of 0.0 per hour.  The oxygen nadir was 78% during sleep.  CARDIAC DATA Mean heart rate during sleep was 58.4 bpm.  IMPRESSIONS - Mild obstructive sleep apnea occurred during this study (AHI = 7.5/h). - No significant central sleep apnea occurred during this study (CAI = 0.0/h). - Severe oxygen desaturation was noted during this study (Min O2 = 78%). - Patient snored 26.0% during the sleep.  DIAGNOSIS - Obstructive Sleep Apnea (327.23 [G47.33 ICD-10])  RECOMMENDATIONS - Therapeutic CPAP titration to determine optimal pressure required to alleviate sleep disordered breathing. - Avoid alcohol, sedatives and other CNS depressants that may worsen sleep apnea and disrupt normal sleep architecture. - Sleep hygiene should be reviewed to assess factors that may improve sleep quality. - Weight management and regular exercise should be initiated or continued.  [Electronically signed] 06/11/2018 10:26 PM  Fransico Him MD, ABSM Diplomate, American Board  of Sleep Medicine

## 2018-06-16 ENCOUNTER — Telehealth: Payer: Self-pay | Admitting: *Deleted

## 2018-06-16 DIAGNOSIS — I10 Essential (primary) hypertension: Secondary | ICD-10-CM

## 2018-06-16 DIAGNOSIS — E663 Overweight: Secondary | ICD-10-CM

## 2018-06-16 NOTE — Telephone Encounter (Signed)
CPAP titration sent to precert. 

## 2018-06-16 NOTE — Telephone Encounter (Signed)
PA submitted to [facility] for CPAP titration study.

## 2018-06-16 NOTE — Telephone Encounter (Signed)
Informed patient of sleep study results and patient understanding was verbalized. Patient understands her sleep study showed they have sleep apnea and recommend CPAP titration. Pt is aware and agreeable to these results. 

## 2018-06-16 NOTE — Telephone Encounter (Signed)
-----   Message from Sueanne Margarita, MD sent at 06/11/2018 10:28 PM EDT ----- Please let patient know that they have sleep apnea and recommend CPAP titration. Please set up titration in the sleep lab.

## 2018-06-19 NOTE — Progress Notes (Signed)
Electrophysiology Office Note Date: 06/21/2018  ID:  Stephanie [name], DOB [birth year], MRN [id number]  PCP: Stephanie Post, MD Electrophysiologist: Stephanie [name]  CC: AF follow up  Stephanie [name] is a 62 y.o. female seen today for Stephanie Stephanie [name].  She presents today for routine electrophysiology followup.  Since last being seen in our clinic, the patient reports doing relatively well.  She has had one episode of AF since I saw her last. She took a prn Metoprolol and her episode terminated after about 90 minutes. She underwent sleep study and is pending CPAP titration.  She is still drinking about 4 beers/night but is working on decreasing. She is retiring in November and has a goal of walking more at that time.  She denies chest pain, palpitations, dyspnea, PND, orthopnea, nausea, vomiting, dizziness, syncope, edema, weight gain, or early satiety.   Atrial Fibrillation Risk Factors:  she does have a diagnosis of sleep apnea. CPAP titration pending. Followed by Stephanie [name].  she does not have a history of rheumatic fever.  she does have a history of alcohol use (drinks up to 4 beers/night).  she has a BMI of Body mass index is 28.59 kg/m.  LA size: 43   Atrial Fibrillation Management history:  Previous antiarrhythmic drugs: none  Previous cardioversions: none  Previous ablations: none  CHADS2VASC score: 2  Anticoagulation history: Xarelto   Past Medical History:  Diagnosis Date  . Diverticulitis 06/2016  . Hyperlipidemia   . Hypertension   . Paroxysmal atrial fibrillation ([place]) 01/26/2016   History reviewed. No pertinent surgical history.  Current Outpatient Medications  Medication Sig Dispense Refill  . cholecalciferol (VITAMIN D) 1000 UNITS tablet Take 1,000 Units by mouth daily.      Marland Kitchen diltiazem (CARDIZEM CD) 180 MG 24 hr capsule Take 1 capsule (180 mg total) by mouth daily. 30 capsule 6  . fish oil-omega-3 fatty acids 1000 MG capsule Take 2 g by mouth daily.       Marland Kitchen FLUoxetine (PROZAC) 20 MG capsule TAKE 1 CAPSULE DAILY 90 capsule 1  . metoprolol succinate (TOPROL-XL) 50 MG 24 hr tablet Take 1/2 tablet by mouth if needed for breakthrough afib for HR >100 as long as BP > 100 30 tablet 9  . rivaroxaban (XARELTO) 20 MG TABS tablet Take 1 tablet (20 mg total) by mouth daily with supper. 90 tablet 1  . simvastatin (ZOCOR) 10 MG tablet TAKE 1 TABLET AT BEDTIME 90 tablet 2   Current Facility-Administered Medications  Medication Dose Route Frequency Provider Last Rate Last Dose  . 0.9 %  sodium chloride infusion  500 mL Intravenous Continuous Nandigam, Venia Minks, MD        Allergies:   Patient has no known allergies.   Social History: Social History   Socioeconomic History  . Marital status: Married    Spouse name: Not on file  . Number of children: 2  . Years of education: Not on file  . Highest education level: Not on file  Occupational History  . Not on file  Social Needs  . Financial resource strain: Not on file  . Food insecurity:    Worry: Not on file    Inability: Not on file  . Transportation needs:    Medical: Not on file    Non-medical: Not on file  Tobacco Use  . Smoking status: Former Smoker    Packs/day: 1.00    Years: 30.00    Pack years: 30.00  Types: Cigarettes    Last attempt to quit: 05/31/2010    Years since quitting: 8.0  . Smokeless tobacco: Never Used  Substance and Sexual Activity  . Alcohol use: Yes    Alcohol/week: 2.0 standard drinks    Types: 2 Cans of beer per week  . Drug use: No  . Sexual activity: Not on file  Lifestyle  . Physical activity:    Days per week: Not on file    Minutes per session: Not on file  . Stress: Not on file  Relationships  . Social connections:    Talks on phone: Not on file    Gets together: Not on file    Attends religious service: Not on file    Active member of club or organization: Not on file    Attends meetings of clubs or organizations: Not on file     Relationship status: Not on file  . Intimate partner violence:    Fear of current or ex partner: Not on file    Emotionally abused: Not on file    Physically abused: Not on file    Forced sexual activity: Not on file  Other Topics Concern  . Not on file  Social History Narrative  . Not on file    Family History: Family History  Problem Relation Age of Onset  . Heart disease Father 89       CAD  . Sudden death Unknown        family hx  . Alzheimer's disease Mother   . Heart disease Brother 3       CAD    Review of Systems: All other systems reviewed and are otherwise negative except as noted above.   Physical Exam: VS:  BP 110/84   Pulse (!) 58   Ht 5\' 8"  (1.727 m)   Wt 188 lb (85.3 kg)   SpO2 97%   BMI 28.59 kg/m  , BMI Body mass index is 28.59 kg/m. Wt Readings from Last 3 Encounters:  06/21/18 188 lb (85.3 kg)  05/10/18 186 lb 12.8 oz (84.7 kg)  05/06/18 180 lb (81.6 kg)    GEN- The patient is well appearing, alert and oriented x 3 today.   HEENT: normocephalic, atraumatic; sclera clear, conjunctiva pink; hearing intact; oropharynx clear; neck supple  Lungs- Clear to ausculation bilaterally, normal work of breathing.  No wheezes, rales, rhonchi Heart- Regular rate and rhythm  GI- soft, non-tender, non-distended, bowel sounds present  Extremities- no clubbing, cyanosis, or edema  MS- no significant deformity or atrophy Skin- warm and dry, no rash or lesion  Psych- euthymic mood, full affect Neuro- strength and sensation are intact   EKG:  EKG is ordered today. The ekg ordered today shows sinus bradycardia, rate 53, PR 166, QRS 96, QTc 405  Recent Labs: 02/27/2018: Magnesium 1.9; TSH 1.956 05/06/2018: ALT 16; BUN 15; Creatinine, Ser 0.92; Hemoglobin 15.3; Platelets 347; Potassium 3.8; Sodium 136    Assessment and Plan:  1.  Paroxysmal atrial fibrillation Doing well on CCB and prn metoprolol. We reviewed plan for recurrent AF episodes at home.  She is  working on lifestyle modification, CPAP titration pending, continuing to work on decreasing [place] for [employer] of 4  2.  Obesity Body mass index is 28.59 kg/m. Regular exercise and weight loss recommended  3.  OSA Compliance with CPAP encouraged once titration done  4.  ETOH Encouraged to decrease further   Current medicines are reviewed at length with  the patient today.   The patient does not have concerns regarding her medicines.  The following changes were made today:  none  Labs/ tests ordered today include: none No orders of the defined types were placed in this encounter.    Disposition:   Follow up with me in 6 months    Signed, Chanetta Marshall, NP 06/21/2018 8:53 AM   [place] [address] [phone] (office) [phone] (fax)

## 2018-06-21 ENCOUNTER — Ambulatory Visit: Payer: 59 | Admitting: Nurse Practitioner

## 2018-06-21 ENCOUNTER — Encounter: Payer: Self-pay | Admitting: Nurse Practitioner

## 2018-06-21 VITALS — BP 110/84 | HR 58 | Ht 68.0 in | Wt 188.0 lb

## 2018-06-21 DIAGNOSIS — E663 Overweight: Secondary | ICD-10-CM

## 2018-06-21 DIAGNOSIS — I10 Essential (primary) hypertension: Secondary | ICD-10-CM

## 2018-06-21 DIAGNOSIS — F1010 Alcohol abuse, uncomplicated: Secondary | ICD-10-CM

## 2018-06-21 DIAGNOSIS — I480 Paroxysmal atrial fibrillation: Secondary | ICD-10-CM | POA: Diagnosis not present

## 2018-06-21 DIAGNOSIS — G4733 Obstructive sleep apnea (adult) (pediatric): Secondary | ICD-10-CM

## 2018-06-21 DIAGNOSIS — Z9989 Dependence on other enabling machines and devices: Secondary | ICD-10-CM

## 2018-06-21 NOTE — Patient Instructions (Signed)
Medication Instructions:   Your physician recommends that you continue on your current medications as directed. Please refer to the Current Medication list given to you today.    If you need a refill on your cardiac medications before your next appointment, please call your pharmacy.  Labwork: NONE ORDERED  TODAY    Testing/Procedures: NONE ORDERED  TODAY    Follow-Up:  Your physician wants you to follow-up in:  IN  6  MONTHS WITH  SEILER   You will receive a reminder letter in the mail two months in advance. If you don't receive a letter, please call our office to schedule the follow-up appointment.     Any Other Special Instructions Will Be Listed Below (If Applicable).                                                                                                                                                   

## 2018-06-29 ENCOUNTER — Telehealth: Payer: Self-pay | Admitting: *Deleted

## 2018-06-29 NOTE — Telephone Encounter (Signed)
Staff message sent to [facility] denied in CPAP titration study. Recommend APAP. Notify ordering provider.

## 2018-06-29 NOTE — Telephone Encounter (Signed)
-----   Message from Freada Bergeron, [place] sent at 06/16/2018  9:38 AM EDT ----- Regarding: pre cert cpap titration

## 2018-07-03 NOTE — Addendum Note (Signed)
Addended by: Freada Bergeron on: 07/03/2018 02:33 PM   Modules accepted: Orders

## 2018-07-03 NOTE — Telephone Encounter (Signed)
   Stephanie Margarita, MD  Freada Bergeron, CMA        Resmed CPAP with heated humidity and mask of choice on auto from 4 to 18cm H2O. Please get d/l in 2 weeks. Needs OV with me in 10 weeks   Traci

## 2018-07-03 NOTE — Telephone Encounter (Signed)
  Sueanne Margarita, MD  Freada Bergeron, CMA        Resmed CPAP with heated humidity and mask of choice on auto from 4 to 18cm H2O. Please get d/l in 2 weeks. Needs OV with me in 10 weeks   Traci Turner     ----- Message -----  From: Freada Bergeron, CMA  Sent: 07/03/2018  1:13 PM EDT  To: Sueanne Margarita, MD  Subject: APAP                       Please write settings for APAP, Thanks Gae Bon  ----- Message -----  From: Sherran Needs, NP  Sent: 07/03/2018 12:33 PM EDT  To: Freada Bergeron, CMA  Subject: RE: pre cert                   Yes.  ----- Message -----  From: Freada Bergeron, CMA  Sent: 06/30/2018  5:16 PM EDT  To: Sherran Needs, NP  Subject: FW: pre cert                   In lab sleep study denied but APAP recommended.Is this ok with you

## 2018-07-03 NOTE — Telephone Encounter (Signed)
Upon patient request DME selection is [facility]. Patient understands she will be contacted by [facility] to set up her cpap. Patient understands to call if [facility] does not contact her with new setup in a timely manner. Patient understands they will be called once confirmation has been received from [facility] that they have received their new machine to schedule 10 week follow up appointment.  [facility] notified of new cpap order  Please add to airview Patient was grateful for the call and thanked me. 

## 2018-07-03 NOTE — Telephone Encounter (Signed)
  Sherran Needs, NP  Freada Bergeron, CMA        Yes.     ----- Message -----  From: Freada Bergeron, CMA  Sent: 06/30/2018  5:16 PM EDT  To: Sherran Needs, NP  Subject: FW: pre cert                   In lab sleep study denied but APAP recommended.Is this ok with you?  ----- Message -----  From: Lauralee Evener, CMA  Sent: 06/29/2018  3:29 PM EDT  To: Freada Bergeron, CMA  Subject: RE: pre cert                   Received a denial from [facility] for in lab CPAP titration. Recommend APAP. Please notify ordering provider.  ----- Message -----  From: Freada Bergeron, CMA  Sent: 06/16/2018  9:38 AM EDT  To: Cv Div Sleep Studies  Subject: pre cert                     cpap titration

## 2018-07-14 NOTE — Telephone Encounter (Signed)
Per [facility] patient called today to say she wants to wait until January to be set up because she has not met her deductible this year.

## 2018-09-04 ENCOUNTER — Other Ambulatory Visit: Payer: Self-pay | Admitting: Family Medicine

## 2018-09-14 ENCOUNTER — Other Ambulatory Visit (HOSPITAL_COMMUNITY): Payer: Self-pay | Admitting: *Deleted

## 2018-09-14 MED ORDER — RIVAROXABAN 20 MG PO TABS: 20.0000 mg | ORAL_TABLET | Freq: Every day | ORAL | 2 refills | Status: DC

## 2018-09-29 ENCOUNTER — Encounter: Payer: Self-pay | Admitting: Family Medicine

## 2018-12-13 ENCOUNTER — Other Ambulatory Visit: Payer: Self-pay | Admitting: Nurse Practitioner

## 2018-12-15 ENCOUNTER — Other Ambulatory Visit: Payer: Self-pay | Admitting: Family Medicine

## 2018-12-15 NOTE — Telephone Encounter (Signed)
See CRM # [phone] for specific pt request; refill request for fluoxetine; last office visit 09/27/17 with Dr Elease Hashimoto; no upcoming visit noted; left message on voicemail [phone]; will route to office for final disposition.  Requested medication (s) are due for refill today: yes  Requested medication (s) are on the active medication list: yes  Last refill: 06/06/18  Future visit scheduled: no   Notes to clinic:  Last office visit 09/27/17

## 2018-12-15 NOTE — Telephone Encounter (Signed)
Copied from [place] [id number]. Topic: Quick Communication - Rx Refill/Question >> Dec 15, 2018  9:37 AM Ahmed Prima L wrote: Medication: FLUoxetine (PROZAC) 20 MG capsule ( would like a 30 day sent to a local pharmacy, see below - since she is almost out and would like the 90 [facility] )  Has the patient contacted their pharmacy? yes (Agent: If no, request that the patient contact the pharmacy for the refill.) (Agent: If yes, when and what did the pharmacy advise?)  Preferred Pharmacy (with phone number or street name):[facility] [address] [place] - [address] [place] [address] Phone: [phone] Fax: [phone]   [facility] [place] [address] [place] [address] Phone: [phone] Fax: [phone]    Agent: Please be advised that RX refills may take up to 3 business days. We ask that you follow-up with your pharmacy.

## 2018-12-19 ENCOUNTER — Encounter: Payer: Self-pay | Admitting: Family Medicine

## 2018-12-19 ENCOUNTER — Other Ambulatory Visit: Payer: Self-pay

## 2018-12-19 ENCOUNTER — Ambulatory Visit (INDEPENDENT_AMBULATORY_CARE_PROVIDER_SITE_OTHER): Payer: 59 | Admitting: Family Medicine

## 2018-12-19 VITALS — BP 128/82 | HR 58 | Temp 97.6°F | Ht 68.0 in | Wt 188.7 lb

## 2018-12-19 DIAGNOSIS — I10 Essential (primary) hypertension: Secondary | ICD-10-CM | POA: Diagnosis not present

## 2018-12-19 DIAGNOSIS — E785 Hyperlipidemia, unspecified: Secondary | ICD-10-CM | POA: Diagnosis not present

## 2018-12-19 DIAGNOSIS — I480 Paroxysmal atrial fibrillation: Secondary | ICD-10-CM | POA: Diagnosis not present

## 2018-12-19 DIAGNOSIS — F419 Anxiety disorder, unspecified: Secondary | ICD-10-CM | POA: Diagnosis not present

## 2018-12-19 LAB — HEPATIC FUNCTION PANEL
ALBUMIN: 4.5 g/dL (ref 3.5–5.2)
ALK PHOS: 85 U/L (ref 39–117)
ALT: 12 U/L (ref 0–35)
AST: 14 U/L (ref 0–37)
Bilirubin, Direct: 0.1 mg/dL (ref 0.0–0.3)
TOTAL PROTEIN: 7 g/dL (ref 6.0–8.3)
Total Bilirubin: 0.7 mg/dL (ref 0.2–1.2)

## 2018-12-19 LAB — LIPID PANEL
CHOLESTEROL: 293 mg/dL — AB (ref 0–200)
HDL: 89.3 mg/dL (ref >39.00)
LDL Cholesterol: 173 mg/dL — ABNORMAL HIGH (ref 0–99)
NONHDL: 203.94
TRIGLYCERIDES: 157 mg/dL — AB (ref 0.0–149.0)
Total CHOL/HDL Ratio: 3
VLDL: 31.4 mg/dL (ref 0.0–40.0)

## 2018-12-19 MED ORDER — FLUOXETINE HCL 20 MG PO CAPS: 20.0000 mg | ORAL_CAPSULE | Freq: Every day | ORAL | 3 refills | Status: DC

## 2018-12-19 NOTE — Progress Notes (Signed)
  Subjective:     Patient ID: Stephanie [name], female   DOB: [birth year], 63 y.o.   MRN: [id number]  HPI Patient seen for medical follow-up.  She needs refills of Prozac and simvastatin.  She has been on Prozac for several years for anxiety symptoms.  She has had what sounds like occasional panic attacks.  Still has occasional breakthrough anxiety symptoms even on Prozac.  Denies depression.  She retired in November.  She is very happy about that.  On simvastatin low-dose (10 mg) for hyperlipidemia.  Denies any myalgias.  No history of CAD.  Due for lipid panel at this time.  She does have history of paroxysmal atrial fibrillation and hypertension.  She had episode of probable A. fib last week.  She takes metoprolol as needed.  No recent chest pains. She remains on Xarelto.  No recent bleeding complications.  She had brief syncopal episode last summer which they think was related to possible A. fib recurrence.  Work-up was unrevealing at that time.  None since then.  Past Medical History:  Diagnosis Date  . Diverticulitis 06/2016  . Hyperlipidemia   . Hypertension   . Paroxysmal atrial fibrillation ([place]) 01/26/2016   History reviewed. No pertinent surgical history.  reports that she quit smoking about 8 years ago. Her smoking use included cigarettes. She has a 30.00 pack-year smoking history. She has never used smokeless tobacco. She reports current alcohol use of about 2.0 standard drinks of alcohol per week. She reports that she does not use drugs. family history includes Alzheimer's disease in her mother; Heart disease (age of onset: 74) in her brother and father; Sudden death in an other family member. No Known Allergies   Review of Systems  Constitutional: Negative for appetite change, chills, fever and unexpected weight change.  Respiratory: Negative for shortness of breath.   Cardiovascular: Positive for palpitations. Negative for chest pain.  Gastrointestinal: Negative for abdominal  pain.  Neurological: Negative for syncope.  Psychiatric/Behavioral: Negative for dysphoric mood. The patient is nervous/anxious.        Objective:   Physical Exam Constitutional:      Appearance: Normal appearance.  Neck:     Musculoskeletal: Neck supple.  Cardiovascular:     Pulses: Normal pulses.     Heart sounds: Normal heart sounds.  Pulmonary:     Effort: Pulmonary effort is normal.     Breath sounds: Normal breath sounds.  Neurological:     Mental Status: She is alert.  Psychiatric:        Mood and Affect: Mood normal.        Thought Content: Thought content normal.        Assessment:     #1 dyslipidemia.  Patient currently on low-dose simvastatin  #2 hypertension.  Stable and at goal  #3.  History of paroxysmal atrial fibrillation.  Reported episode last week.  #4 history of chronic anxiety-fairly stable on Prozac    Plan:     -Refilled Prozac for 1 year -Recheck lipid and hepatic panel -Consider switch to more potent statin such as Crestor but wait on lipids first -Schedule routine follow-up in 6 months and sooner as needed  Eulas Post MD [facility]

## 2018-12-20 ENCOUNTER — Other Ambulatory Visit: Payer: Self-pay

## 2018-12-20 DIAGNOSIS — E7800 Pure hypercholesterolemia, unspecified: Secondary | ICD-10-CM

## 2018-12-22 ENCOUNTER — Telehealth: Payer: Self-pay

## 2018-12-22 ENCOUNTER — Other Ambulatory Visit: Payer: Self-pay

## 2018-12-22 MED ORDER — ROSUVASTATIN CALCIUM 20 MG PO TABS: 20.0000 mg | ORAL_TABLET | Freq: Every day | ORAL | 3 refills | Status: DC

## 2018-12-22 NOTE — Telephone Encounter (Signed)
Copied from [place] [id number]. Topic: General - Other >> Dec 20, 2018  5:30 PM Yvette Rack wrote: Reason for CRM: Pt returned call to office to advise that she would like the Rx for Crestor to be sent to [facility], [place] - [address] [phone] (Phone) [phone] (Fax)

## 2018-12-22 NOTE — Telephone Encounter (Signed)
Prescription has been sent to the [facility] for patient.

## 2019-01-08 ENCOUNTER — Other Ambulatory Visit: Payer: Self-pay | Admitting: Nurse Practitioner

## 2019-01-08 MED ORDER — RIVAROXABAN 20 MG PO TABS: 20.0000 mg | ORAL_TABLET | Freq: Every day | ORAL | 1 refills | Status: DC

## 2019-01-08 MED ORDER — DILTIAZEM HCL ER COATED BEADS 180 MG PO CP24: 180.0000 mg | ORAL_CAPSULE | Freq: Every day | ORAL | 1 refills | Status: DC

## 2019-01-08 NOTE — Telephone Encounter (Signed)
°*  STAT* If patient is at the pharmacy, call can be transferred to refill team.   1. Which medications need to be refilled? (please list name of each medication and dose if known) xarelto 20 mg cartia xt 180/24 HR  2. Which pharmacy/location (including street and city if local pharmacy) is medication to [facility] sent to[facility] [place]  3. Do they need a 30 day or 90 day supply? [place]

## 2019-01-08 NOTE — Telephone Encounter (Signed)
Geronimo Boot [facility] [place]

## 2019-01-08 NOTE — Telephone Encounter (Signed)
Xarelto 20mg  refill request received; pt is 63 yrs old, wt-85.6kg, Crea-0.92 on 05/06/2018, last seen by Chanetta Marshall on 06/21/2018; CrCl-32ml/min; will [facility] in [facility] [place].

## 2019-01-08 NOTE — Telephone Encounter (Signed)
Pt went to the pharmacy and only the xarelto was refilled. She needed Cartia (CARTIA XT 180 MG 24 hr capsule) as well, but that request did not not get submitted

## 2019-01-08 NOTE — Addendum Note (Signed)
Addended by: Jones Broom on: 01/08/2019 04:50 PM   Modules accepted: Orders

## 2019-01-10 ENCOUNTER — Emergency Department (HOSPITAL_COMMUNITY)
Admission: EM | Admit: 2019-01-10 | Discharge: 2019-01-10 | Disposition: A | Discharge status: Home / Self Care | Payer: 59 | Attending: Emergency Medicine | Admitting: Emergency Medicine

## 2019-01-10 ENCOUNTER — Other Ambulatory Visit: Payer: Self-pay

## 2019-01-10 ENCOUNTER — Emergency Department (HOSPITAL_COMMUNITY): Payer: 59

## 2019-01-10 ENCOUNTER — Encounter (HOSPITAL_COMMUNITY): Payer: Self-pay

## 2019-01-10 DIAGNOSIS — Z87891 Personal history of nicotine dependence: Secondary | ICD-10-CM | POA: Insufficient documentation

## 2019-01-10 DIAGNOSIS — I10 Essential (primary) hypertension: Secondary | ICD-10-CM | POA: Insufficient documentation

## 2019-01-10 DIAGNOSIS — R0789 Other chest pain: Secondary | ICD-10-CM | POA: Insufficient documentation

## 2019-01-10 DIAGNOSIS — Z79899 Other long term (current) drug therapy: Secondary | ICD-10-CM | POA: Diagnosis not present

## 2019-01-10 LAB — CBC WITH DIFFERENTIAL/PLATELET
Abs Immature Granulocytes: 0.02 10*3/uL (ref 0.00–0.07)
Basophils Absolute: 0 10*3/uL (ref 0.0–0.1)
Basophils Relative: 1 %
Eosinophils Absolute: 0 10*3/uL (ref 0.0–0.5)
Eosinophils Relative: 1 %
HCT: 43.4 % (ref 36.0–46.0)
Hemoglobin: 14.3 g/dL (ref 12.0–15.0)
Immature Granulocytes: 0 %
Lymphocytes Relative: 15 %
Lymphs Abs: 1 10*3/uL (ref 0.7–4.0)
MCH: 30.7 pg (ref 26.0–34.0)
MCHC: 32.9 g/dL (ref 30.0–36.0)
MCV: 93.1 fL (ref 80.0–100.0)
Monocytes Absolute: 0.7 10*3/uL (ref 0.1–1.0)
Monocytes Relative: 11 %
Neutro Abs: 4.5 10*3/uL (ref 1.7–7.7)
Neutrophils Relative %: 72 %
Platelets: 327 10*3/uL (ref 150–400)
RBC: 4.66 MIL/uL (ref 3.87–5.11)
RDW: 11.9 % (ref 11.5–15.5)
WBC: 6.2 10*3/uL (ref 4.0–10.5)
nRBC: 0 % (ref 0.0–0.2)

## 2019-01-10 LAB — COMPREHENSIVE METABOLIC PANEL
ALT: 14 U/L (ref 0–44)
AST: 16 U/L (ref 15–41)
Albumin: 4.3 g/dL (ref 3.5–5.0)
Alkaline Phosphatase: 93 U/L (ref 38–126)
Anion gap: 10 (ref 5–15)
BUN: 10 mg/dL (ref 8–23)
CO2: 24 mmol/L (ref 22–32)
Calcium: 9.8 mg/dL (ref 8.9–10.3)
Chloride: 103 mmol/L (ref 98–111)
Creatinine, Ser: 0.7 mg/dL (ref 0.44–1.00)
GFR calc Af Amer: >60 mL/min (ref >60)
GFR calc non Af Amer: >60 mL/min (ref >60)
Glucose, Bld: 98 mg/dL (ref 70–99)
Potassium: 4.7 mmol/L (ref 3.5–5.1)
Sodium: 137 mmol/L (ref 135–145)
Total Bilirubin: 0.7 mg/dL (ref 0.3–1.2)
Total Protein: 7.5 g/dL (ref 6.5–8.1)

## 2019-01-10 LAB — TROPONIN I
Troponin I: <0.03 ng/mL (ref <0.03)
Troponin I: <0.03 ng/mL (ref <0.03)

## 2019-01-10 MED ORDER — ACETAMINOPHEN 325 MG PO TABS
650.0000 mg | ORAL_TABLET | Freq: Once | ORAL | Status: AC
  Administered 2019-01-10: 16:00:00 650 mg via ORAL
  Filled 2019-01-10: qty 2

## 2019-01-10 NOTE — ED Provider Notes (Signed)
[facility] EMERGENCY DEPARTMENT Provider Note   CSN: [id number] Arrival date & time: 01/10/19  1253    History   Chief Complaint Chief Complaint  Patient presents with  . Chest Pain    HPI Stephanie [name] is a 63 y.o. female.     Patient states that this morning she woke up with some chest discomfort.  She took some Pepcid which helped some.  She return to the ER for evaluation for continued discomfort  The history is provided by the patient. No language interpreter was used.  Chest Pain  Pain location:  Substernal area Pain quality: aching   Pain radiates to:  Does not radiate Pain severity:  Mild Onset quality:  Sudden Timing:  Intermittent Progression:  Waxing and waning Chronicity:  New Associated symptoms: no abdominal pain, no back pain, no cough, no fatigue and no headache     Past Medical History:  Diagnosis Date  . Diverticulitis 06/2016  . Hyperlipidemia   . Hypertension   . Paroxysmal atrial fibrillation ([place]) 01/26/2016    Patient Active Problem List   Diagnosis Date Noted  . Anxiety 12/19/2018  . Paroxysmal atrial fibrillation ([place]) 01/26/2016  . Hyperlipidemia 09/01/2009  . TOBACCO ABUSE 09/01/2009  . Essential hypertension 09/01/2009    History reviewed. No pertinent surgical history.   OB History   No obstetric history on file.      Home Medications    Prior to Admission medications   Medication Sig Start Date End Date Taking? Authorizing Provider  cholecalciferol (VITAMIN D) 1000 UNITS tablet Take 1,000 Units by mouth daily.      [provider]  diltiazem (CARTIA XT) 180 MG 24 hr capsule Take 1 capsule (180 mg total) by mouth daily. 01/08/19   Chanetta Marshall K, NP  fish oil-omega-3 fatty acids 1000 MG capsule Take 2 g by mouth daily.      [provider]  FLUoxetine (PROZAC) 20 MG capsule Take 1 capsule (20 mg total) by mouth daily. 12/19/18   Burchette, Alinda Sierras, MD  metoprolol succinate (TOPROL-XL) 50 MG 24 hr tablet  Take 1/2 tablet by mouth if needed for breakthrough afib for HR >100 as long as BP > 100 05/25/16   Sherran Needs, NP  rivaroxaban (XARELTO) 20 MG TABS tablet Take 1 tablet (20 mg total) by mouth daily with supper. 01/08/19   Chanetta Marshall K, NP  rosuvastatin (CRESTOR) 20 MG tablet Take 1 tablet (20 mg total) by mouth daily. 12/22/18   Burchette, Alinda Sierras, MD    Family History Family History  Problem Relation Age of Onset  . Heart disease Father 38       CAD  . Sudden death Other        family hx  . Alzheimer's disease Mother   . Heart disease Brother 91       CAD    Social History Social History   Tobacco Use  . Smoking status: Former Smoker    Packs/day: 1.00    Years: 30.00    Pack years: 30.00    Types: Cigarettes    Last attempt to quit: 05/31/2010    Years since quitting: 8.6  . Smokeless tobacco: Never Used  Substance Use Topics  . Alcohol use: Yes    Alcohol/week: 2.0 standard drinks    Types: 2 Cans of beer per week    Comment: occ  . Drug use: No     Allergies   Patient has no known allergies.  Review of Systems Review of Systems  Constitutional: Negative for appetite change and fatigue.  HENT: Negative for congestion, ear discharge and sinus pressure.   Eyes: Negative for discharge.  Respiratory: Negative for cough.   Cardiovascular: Positive for chest pain.  Gastrointestinal: Negative for abdominal pain and diarrhea.  Genitourinary: Negative for frequency and hematuria.  Musculoskeletal: Negative for back pain.  Skin: Negative for rash.  Neurological: Negative for seizures and headaches.  Psychiatric/Behavioral: Negative for hallucinations.     Physical Exam Updated Vital Signs BP (!) 143/77 (BP Location: Right Arm)   Pulse (!) 58   Temp 98.6 F (37 C) (Oral)   Resp 20   Ht 5\' 7"  (1.702 m)   Wt 83.9 kg   SpO2 95%   BMI 28.98 kg/m   Physical Exam Vitals signs and nursing note reviewed.  Constitutional:      Appearance: She is  well-developed.  HENT:     Head: Normocephalic.     Nose: Nose normal.  Eyes:     General: No scleral icterus.    Conjunctiva/sclera: Conjunctivae normal.  Neck:     Musculoskeletal: Neck supple.     Thyroid: No thyromegaly.  Cardiovascular:     Rate and Rhythm: Normal rate and regular rhythm.     Heart sounds: No murmur. No friction rub. No gallop.   Pulmonary:     Breath sounds: No stridor. No wheezing or rales.  Chest:     Chest wall: No tenderness.  Abdominal:     General: There is no distension.     Tenderness: There is no abdominal tenderness. There is no rebound.  Musculoskeletal: Normal range of motion.  Lymphadenopathy:     Cervical: No cervical adenopathy.  Skin:    Findings: No erythema or rash.  Neurological:     Mental Status: She is oriented to person, place, and time.     Motor: No abnormal muscle tone.     Coordination: Coordination normal.  Psychiatric:        Behavior: Behavior normal.      ED Treatments / Results  Labs (all labs ordered are listed, but only abnormal results are displayed) Labs Reviewed  CBC WITH DIFFERENTIAL/PLATELET  COMPREHENSIVE METABOLIC PANEL  TROPONIN I  TROPONIN I    EKG EKG Interpretation  Date/Time:  Wednesday January 10 2019 13:05:16 EDT Ventricular Rate:  58 PR Interval:    QRS Duration: 105 QT Interval:  435 QTC Calculation: 428 R Axis:   61 Text Interpretation:  Sinus rhythm Probable left atrial enlargement Baseline wander in lead(s) I III aVL V5 Confirmed by Milton Ferguson [id number]) on 01/10/2019 2:43:39 PM   Radiology Dg Chest 2 View  Result Date: 01/10/2019 CLINICAL DATA:  Chest pain and indigestion. EXAM: CHEST - 2 VIEW COMPARISON:  Chest x-ray dated May 06, 2018. FINDINGS: The heart size and mediastinal contours are within normal limits. Normal pulmonary vascularity. Chronically coarsened interstitial markings are similar to prior study and likely smoking-related. No focal consolidation, pleural effusion, or  pneumothorax. No acute osseous abnormality. IMPRESSION: No active cardiopulmonary disease. Electronically Signed   By: Titus Dubin M.D.   On: 01/10/2019 14:09    Procedures Procedures (including critical care time)  Medications Ordered in ED Medications  acetaminophen (TYLENOL) tablet 650 mg (650 mg Oral Given 01/10/19 1535)     Initial Impression / Assessment and Plan / ED Course  I have reviewed the triage vital signs and the nursing notes.  Pertinent labs & imaging results that  were available during my care of the patient were reviewed by me and considered in my medical decision making (see chart for details).        Labs EKG and troponin unremarkable.  Patient will get a delta troponin and if that is normal then she will be discharged home with follow-up with her primary care doctor and cardiology Final Clinical Impressions(s) / ED Diagnoses   Final diagnoses:  Atypical chest pain    ED Discharge Orders    None       Milton Ferguson, MD 01/10/19 1549

## 2019-01-10 NOTE — ED Notes (Signed)
Patient  Back from Radiology.

## 2019-01-10 NOTE — ED Notes (Signed)
Patient transported to X-ray 

## 2019-01-10 NOTE — ED Provider Notes (Signed)
Pt received at sign out with 2nd troponin pending. See previous EDP note for full HPI/H&P/MDM. Pt c/o waking up with "dull" mid-sternal CP approximately 4am today. States it has been constant up until approximately 1 or 2 hours ago when it "just went away." 2nd troponin also normal. Pt has tol PO well without N/V.  Doubt ACS as cause for symptoms with normal troponin x2 and unchanged EKG from previous after nearly 10 hours of constant atypical symptoms. No clear indication for admission at this time. Dx and testing d/w pt.  Questions answered.  Verb understanding, agreeable to d/c home with outpt f/u.   BP (!) 144/93 (BP Location: Right Arm)   Pulse 60   Temp 98.8 F (37.1 C) (Oral)   Resp 15   Ht 5\' 7"  (1.702 m)   Wt 83.9 kg   SpO2 97%   BMI 28.98 kg/m     Results for orders placed or performed during the hospital encounter of 01/10/19  CBC with Differential/Platelet  Result Value Ref Range   WBC 6.2 4.0 - 10.5 K/uL   RBC 4.66 3.87 - 5.11 MIL/uL   Hemoglobin 14.3 12.0 - 15.0 g/dL   HCT 43.4 36.0 - 46.0 %   MCV 93.1 80.0 - 100.0 fL   MCH 30.7 26.0 - 34.0 pg   MCHC 32.9 30.0 - 36.0 g/dL   RDW 11.9 11.5 - 15.5 %   Platelets 327 150 - 400 K/uL   nRBC 0.0 0.0 - 0.2 %   Neutrophils Relative % 72 %   Neutro Abs 4.5 1.7 - 7.7 K/uL   Lymphocytes Relative 15 %   Lymphs Abs 1.0 0.7 - 4.0 K/uL   Monocytes Relative 11 %   Monocytes Absolute 0.7 0.1 - 1.0 K/uL   Eosinophils Relative 1 %   Eosinophils Absolute 0.0 0.0 - 0.5 K/uL   Basophils Relative 1 %   Basophils Absolute 0.0 0.0 - 0.1 K/uL   Immature Granulocytes 0 %   Abs Immature Granulocytes 0.02 0.00 - 0.07 K/uL  Comprehensive metabolic panel  Result Value Ref Range   Sodium 137 135 - 145 mmol/L   Potassium 4.7 3.5 - 5.1 mmol/L   Chloride 103 98 - 111 mmol/L   CO2 24 22 - 32 mmol/L   Glucose, Bld 98 70 - 99 mg/dL   BUN 10 8 - 23 mg/dL   Creatinine, Ser 0.70 0.44 - 1.00 mg/dL   Calcium 9.8 8.9 - 10.3 mg/dL   Total Protein  7.5 6.5 - 8.1 g/dL   Albumin 4.3 3.5 - 5.0 g/dL   AST 16 15 - 41 U/L   ALT 14 0 - 44 U/L   Alkaline Phosphatase 93 38 - 126 U/L   Total Bilirubin 0.7 0.3 - 1.2 mg/dL   GFR calc non Af Amer >60 >60 mL/min   GFR calc Af Amer >60 >60 mL/min   Anion gap 10 5 - 15  Troponin I - ONCE - STAT  Result Value Ref Range   Troponin I <0.03 <0.03 ng/mL  Troponin I - ONCE - STAT  Result Value Ref Range   Troponin I <0.03 <0.03 ng/mL   Dg Chest 2 View Result Date: 01/10/2019 CLINICAL DATA:  Chest pain and indigestion. EXAM: CHEST - 2 VIEW COMPARISON:  Chest x-ray dated May 06, 2018. FINDINGS: The heart size and mediastinal contours are within normal limits. Normal pulmonary vascularity. Chronically coarsened interstitial markings are similar to prior study and likely smoking-related. No focal consolidation, pleural  effusion, or pneumothorax. No acute osseous abnormality. IMPRESSION: No active cardiopulmonary disease. Electronically Signed   By: Titus Dubin M.D.   On: 01/10/2019 14:09      Francine Graven, DO 01/10/19 1817

## 2019-01-10 NOTE — Discharge Instructions (Signed)
Follow-up with Dr. Bronson Ing or 1 of his partners next week for recheck and possible stress test.  Return if any problems

## 2019-01-10 NOTE — ED Triage Notes (Signed)
Pt reports woke up with cp/indigestion around 4am.  Reports took pepcid and went back to bed.  Reports bp was elevated when she got up at 8am.   EMS administered 4 baby asa.

## 2019-01-15 ENCOUNTER — Telehealth: Payer: Self-pay | Admitting: Nurse Practitioner

## 2019-01-15 ENCOUNTER — Ambulatory Visit: Payer: Self-pay

## 2019-01-15 NOTE — Telephone Encounter (Signed)
Patient's daughter Claiborne Billings called, I asked to speak to the patient who gave permission to speak to [place]. The patient stayed on the phone and answered questions as well. Claiborne Billings says that about 1 hour ago, her mother was sitting and getting ready to check her blood pressure, she looked at the patient because she heard snoring and she was staring off. Claiborne Billings says she called her and she turned around and said what. This lasted about 10 seconds. The patient then says she felt like her body was shaking, having a seizure and she felt like she was biting her tongue, but wasn't. Her daughter walked her to the couch and she laid down, her BP was 200/100. The patient says she was real weak. She says this is the 2nd time in about 1-1/2 weeks this has happened. She says the last time it happened she was standing getting ready to look at her granddaughter's computer when all of a sudden she passed out on the floor. She says she heard her granddaughter ask if she was alright, then she woke up and answered her. She says she got up and laid down and felt weak for a while after, then was back to herself. Her daughter says on Saturday she woke up in the middle of the night around 0400 not feeling right and her BP was low. Claiborne Billings says about 30 minutes ago she took a half metoprolol for her high BP, and the recheck right before calling was 160/78, P 68. The patient says she's feeling better now, no symptoms. I called the office and spoke to [facility] who says to let the patient know she will receive a call back. I advised Claiborne Billings and she asks to call her on her cell number [phone].   Answer Assessment - Initial Assessment Questions 1. ONSET: "How long did the seizure last?" (Minutes)      Maybe 10 seconds 2. CONTENT: "Describe what happened during the seizure. Did the body become stiff? Was there any jerking?"      Checking her BP, like I blacked out and could feel my body shaking, then my daughter called my name I snapped out of it 3.  CIRCUMSTANCE: "What was the individual doing when the seizure began?"      Sitting getting ready to check my BP 4. MENTAL STATUS: "Does he know who he is, who you are, and where he is?"      Yes 5. PRIOR SEIZURES: "Has the individual had a seizure (convulsion) before?" If so, ask: "When was the last time?" and "What happened last time?"     NO 6. EPILEPSY: "Does the individual have epilepsy?" (note: check for medical ID bracelet)     No 7. MEDICATIONS: "Does the individual take anticonvulsant medications?" (e.g., yes/no, compliance, any recent changes)     No 8. INJURY: "Did the individual hurt himself during the seizure?" (e.g., head, tongue)     Felt like I had bit my tongue 9. OTHER SYMPTOMS: "Are there any other symptoms?" (e.g., fever, headache)     Weakness for 1 hour after 10. PREGNANCY: "Is there any chance you are pregnant?" "When was your last menstrual period?"      No  Protocols used: [facility]

## 2019-01-15 NOTE — Telephone Encounter (Signed)
I spoke to the patient's daughter Stephanie [name]) because her mother was too weak to talk on the phone.  She stated that the patient's BP with the automatic machine was 200/109, but the daughter checked it manually and it was 160/78. HR 70   The patient apparently fainted 1 week ago and just feels faint-like symptoms today with no CP or SOB.  She was recently in the ED for CP, but was told that everything was negative from a cardiac standpoint.  I told them that they should contact the patient's PCP for further evaluation, and that I would forward notes to [employer], if further recommendations.

## 2019-01-15 NOTE — Telephone Encounter (Signed)
Updated notes (Chart Review) from patient's/daughter's conversation with PCP office.

## 2019-01-15 NOTE — Telephone Encounter (Signed)
New Message          Patient fainted a week ago and today she is having fainting spells and weakness,and elevated b/p pls call to advise

## 2019-01-15 NOTE — Telephone Encounter (Signed)
definitely needs further evaluation.  I think she will need office follow up. Needs good neuro exam, labs, maybe some imaging/eeg set up 30 minute appt.

## 2019-01-16 ENCOUNTER — Other Ambulatory Visit: Payer: Self-pay

## 2019-01-16 ENCOUNTER — Encounter: Payer: Self-pay | Admitting: Family Medicine

## 2019-01-16 ENCOUNTER — Ambulatory Visit: Payer: 59 | Admitting: Family Medicine

## 2019-01-16 VITALS — BP 130/80 | HR 55 | Temp 98.1°F | Ht 68.0 in | Wt 183.3 lb

## 2019-01-16 DIAGNOSIS — R4020 Unspecified coma: Secondary | ICD-10-CM | POA: Diagnosis not present

## 2019-01-16 NOTE — Progress Notes (Deleted)
Patient ID: Stephanie [name], female   DOB: 04/17/1956, 63 y.o.   MRN: [id number]  Virtual Visit via Video Note  I connected with Ruben Reason on 01/16/19 at 11:00 AM EDT by a video enabled telemedicine application and verified that I am speaking with the correct person using two identifiers.  Location patient: home Location provider:work or home office Persons participating in the virtual visit: patient, provider  I discussed the limitations of evaluation and management by telemedicine and the availability of in person appointments. The patient expressed understanding and agreed to proceed.   HPI: Patient has history of hypertension, hyperlipidemia, paroxysmal atrial fibrillation.  She had called yesterday following episode of loss of consciousness.  She states this is the third time since last July this is occurred.  She states last July she was driving and felt very lightheaded and dizzy followed by transient loss of consciousness.  She then had a second episode March 28 which was on Saturday.  She states that she was at a computer with her 49-year-old granddaughter and had stood up and noticed some increased heart rate and then after a few seconds presumed transient loss of consciousness.  When she came back around she was immediately aware of her surroundings.  She states after each episode of syncope she has had extreme weakness but no confusion.  We received a call yesterday that her daughter had observed.  Her daughter is an Therapist, sports and they were sitting around 1 PM after eating lunch and she states her mother appeared to be staring off for about 10 seconds followed by some transient shaking and tremor and then transient syncope lasting about 10 seconds.  She again felt lethargic afterwards but no confusion.  Blood pressure after episode was 200/100.  Patient states she has had blood pressures have been up and down.  She is had some days where she felt dizzy and states her blood pressures been around 80  systolic and then also has as above.  Patient also relates last Wednesday she had episode of atypical chest pain and went to [facility].  Troponin enzyme negative.  CBC and comprehensive metabolic panel unremarkable.  EKG then showed sinus rhythm with no acute abnormalities  Patient reportedly has had recollection of some general shaking prior to syncope and her daughter reported that she had some shaking even during her unconsciousness for just a few seconds.  She has never had any loss of urine or stool continence.  Patient had echocardiogram 2017 with ejection fraction 55 to 60% no major valvular abnormalities.  She had event monitor couple years ago which showed A. fib with rapid ventricular response.  She remains on Xarelto and also takes diltiazem.  She takes metoprolol only for episodes of palpitation.  She has had some occasional headaches recently mostly mid parieto-occipital area.  She states these are usually mild and very transient and basically occur more often outdoors.  She thinks these are sinus related.  She is non-exertional headache.  No nausea or vomiting.  No appetite or weight changes.  Denies any focal neuro symptoms such as vision change or any focal weakness   ROS: See pertinent positives and negatives per HPI.  Past Medical History:  Diagnosis Date  . Diverticulitis 06/2016  . Hyperlipidemia   . Hypertension   . Paroxysmal atrial fibrillation ([place]) 01/26/2016    History reviewed. No pertinent surgical history.  Family History  Problem Relation Age of Onset  . Heart disease Father 67  CAD  . Sudden death Other        family hx  . Alzheimer's disease Mother   . Heart disease Brother 8       CAD    SOCIAL HX: Former smoker.  Quit 2011.  She is retired.  No regular alcohol use.   Current Outpatient Medications:  .  cholecalciferol (VITAMIN D) 1000 UNITS tablet, Take 1,000 Units by mouth daily.  , Disp: , Rfl:  .  diltiazem (CARTIA XT) 180 MG 24  hr capsule, Take 1 capsule (180 mg total) by mouth daily., Disp: 90 capsule, Rfl: 1 .  FLUoxetine (PROZAC) 20 MG capsule, Take 1 capsule (20 mg total) by mouth daily., Disp: 90 capsule, Rfl: 3 .  metoprolol succinate (TOPROL-XL) 50 MG 24 hr tablet, Take 1/2 tablet by mouth if needed for breakthrough afib for HR >100 as long as BP > 100 (Patient taking differently: Take 25 mg by mouth See admin instructions. Take 1/2 tablet by mouth if needed for breakthrough afib for HR >100 as long as BP > 100), Disp: 30 tablet, Rfl: 9 .  rivaroxaban (XARELTO) 20 MG TABS tablet, Take 1 tablet (20 mg total) by mouth daily with supper. (Patient taking differently: Take 20 mg by mouth every morning. ), Disp: 90 tablet, Rfl: 1 .  rosuvastatin (CRESTOR) 20 MG tablet, Take 1 tablet (20 mg total) by mouth daily. (Patient taking differently: Take 20 mg by mouth every morning. ), Disp: 90 tablet, Rfl: 3  Current Facility-Administered Medications:  .  0.9 %  sodium chloride infusion, 500 mL, Intravenous, Continuous, Nandigam, Kavitha V, MD  EXAM:  VITALS per patient if applicable:  GENERAL: alert, oriented, appears well and in no acute distress  HEENT: atraumatic, conjunttiva clear, no obvious abnormalities on inspection of external nose and ears  NECK: normal movements of the head and neck  LUNGS: on inspection no signs of respiratory distress, breathing rate appears normal, no obvious gross SOB, gasping or wheezing  CV: no obvious cyanosis  MS: moves all visible extremities without noticeable abnormality  PSYCH/NEURO: pleasant and cooperative, no obvious depression or anxiety, speech and thought processing grossly intact  ASSESSMENT AND PLAN:  Discussed the following assessment and plan:  #1 transient loss of consciousness.  Yesterday made third episode since July.  Etiology not clear.  No clear provoking factors for vasovagal syncope.  Even though daughter states that she appeared to be "staring off  "yesterday prior to episode not clear this represents seizure activity -We elected not to get any further labs since these were done at [facility] recently last week as well as EKG -Consider EEG to further evaluate -Recommend follow-up with EP specialist in cardiology  #2 history of atrial fibrillation with rapid ventricular response.  Appears to be in sinus rhythm today -Continue Xarelto and diltiazem     I discussed the assessment and treatment plan with the patient. The patient was provided an opportunity to ask questions and all were answered. The patient agreed with the plan and demonstrated an understanding of the instructions.   The patient was advised to call back or seek an in-person evaluation if the symptoms worsen or if the condition fails to improve as anticipated.   Carolann Littler, MD '

## 2019-01-16 NOTE — Patient Instructions (Signed)
Syncope    Syncope refers to a condition in which a person temporarily loses consciousness. Syncope may also be called fainting or passing out. It is caused by a sudden decrease in blood flow to the brain. Even though most causes of syncope are not dangerous, syncope can be a sign of a serious medical problem. Your health care provider may do tests to find the reason why you are having syncope.  Signs that you may be about to faint include:   Feeling dizzy or light-headed.   Feeling nauseous.   Seeing all white or all black in your field of vision.   Having cold, clammy skin.  If you faint, get medical help right away. Call your local emergency services (911 in the [place] Do not drive yourself to the hospital.  Follow these instructions at home:  Pay attention to any changes in your symptoms. Take these actions to stay safe and to help relieve your symptoms:  Lifestyle   Do not drive, use machinery, or play sports until your health care provider says it is okay.   Do not drink alcohol.   Do not use any products that contain nicotine or tobacco, such as cigarettes and e-cigarettes. If you need help quitting, ask your health care provider.   Drink enough fluid to keep your urine pale yellow.  General instructions   Take over-the-counter and prescription medicines only as told by your health care provider.   If you are taking blood pressure or heart medicine, get up slowly and take several minutes to sit and then stand. This can reduce dizziness or light-headedness.   Have someone stay with you until you feel stable.   If you start to feel like you might faint, lie down right away and raise (elevate) your feet above the level of your heart. Breathe deeply and steadily. Wait until all the symptoms have passed.   Keep all follow-up visits as told by your health care provider. This is important.  Get help right away if you:   Have a severe headache.   Faint once or repeatedly.   Have pain in your chest,  abdomen, or back.   Have a very fast or irregular heartbeat (palpitations).   Have pain when you breathe.   Are bleeding from your mouth or rectum, or you have black or tarry stool.   Have a seizure.   Are confused.   Have trouble walking.   Have severe weakness.   Have vision problems.  These symptoms may represent a serious problem that is an emergency. Do not wait to see if your symptoms will go away. Get medical help right away. Call your local emergency services (911 in the [place] Do not drive yourself to the hospital.  Summary   Syncope refers to a condition in which a person temporarily loses consciousness. It is caused by a sudden decrease in blood flow to the brain.   Signs that you may be about to faint include dizziness, feeling light-headed, feeling nauseous, sudden vision changes, or cold, clammy skin.   Although most causes of syncope are not dangerous, syncope can be a sign of a serious medical problem. If you faint, get medical help right away.  This information is not intended to replace advice given to you by your health care provider. Make sure you discuss any questions you have with your health care provider.  Document Released: 09/27/2005 Document Revised: 09/05/2017 Document Reviewed: 09/05/2017  Elsevier Interactive Patient Education  2019 Elsevier Inc[employer]

## 2019-01-16 NOTE — Telephone Encounter (Signed)
Patient has an office visit today.

## 2019-01-17 NOTE — Progress Notes (Signed)
Subjective:     Patient ID: Stephanie [name], female   DOB: [birth year], 63 y.o.   MRN: [id number]  HPI HPI: Patient has history of hypertension, hyperlipidemia, paroxysmal atrial fibrillation.  She had called yesterday following episode of loss of consciousness on 01-15-19.  She states this is the third time since last July this has occurred.  She states last July she was driving and felt very lightheaded and dizzy followed by transient loss of consciousness.  She then had a second episode March 28 which was on Saturday.  She states that she was at a computer with her 5-year-old granddaughter and had stood up and noticed some increased heart rate and then after a few seconds presumed transient loss of consciousness.  When she came back around she was immediately aware of her surroundings.  She states after each episode of syncope she has had extreme weakness but no confusion.  We received a call yesterday that her daughter had observed episode.  Her daughter is an Therapist, sports and they were sitting around 1 PM after eating lunch and she states her mother appeared to be "staring off" for about 10 seconds followed by some transient shaking and tremor and then transient syncope lasting about 10 seconds.  She again felt lethargic afterwards but no confusion.  Blood pressure after episode was 200/100.  Patient states she has had blood pressures have been "up and down".  She has had some days where she felt dizzy and states her blood pressures been around 80 systolic and then also had elevation as above.  Patient also relates last Wednesday she had episode of atypical chest pain and went to [facility].  Troponin enzyme negative.  CBC and comprehensive metabolic panel unremarkable.  EKG then showed sinus rhythm with no acute abnormalities  Patient reportedly has had recollection of some general "shaking" prior to syncope and her daughter reported that she had some shaking even during her unconsciousness for just a few  seconds.  She has never had any loss of urine or stool continence.  Patient had echocardiogram 2017 with ejection fraction 55 to 60% and no major valvular abnormalities.  She had event monitor couple years ago which showed A. fib with rapid ventricular response.  She remains on Xarelto and also takes diltiazem.  She takes metoprolol only for episodes of palpitation.  She has had some occasional headaches recently mostly mid parieto-occipital area.  She states these are usually mild and very transient and basically occur more often outdoors.  She thinks these are sinus related.  She denies exertional headache.  No nausea or vomiting.  No appetite or weight changes.  Denies any focal neuro symptoms such as vision change or any focal weakness  Past Medical History:  Diagnosis Date  . Diverticulitis 06/2016  . Hyperlipidemia   . Hypertension   . Paroxysmal atrial fibrillation ([place]) 01/26/2016   History reviewed. No pertinent surgical history.  reports that she quit smoking about 8 years ago. Her smoking use included cigarettes. She has a 30.00 pack-year smoking history. She has never used smokeless tobacco. She reports current alcohol use of about 2.0 standard drinks of alcohol per week. She reports that she does not use drugs. family history includes Alzheimer's disease in her mother; Heart disease (age of onset: 41) in her brother and father; Sudden death in an other family member. No Known Allergies    Review of Systems  Constitutional: Negative for appetite change, chills, fever and unexpected weight change.  Eyes:  Negative for visual disturbance.  Respiratory: Negative for cough and shortness of breath.   Cardiovascular: Negative for chest pain.  Gastrointestinal: Negative for abdominal pain, nausea and vomiting.  Genitourinary: Negative for dysuria.  Skin: Negative for rash.  Neurological: Positive for syncope. Negative for tremors and speech difficulty.  Hematological: Negative for  adenopathy.  Psychiatric/Behavioral: Negative for confusion.       Objective:   Physical Exam Vitals reviewed: BP 138/70 sitting and standing.  Constitutional:      Appearance: Normal appearance.  HENT:     Head: Normocephalic and atraumatic.  Eyes:     Pupils: Pupils are equal, round, and reactive to light.  Neck:     Musculoskeletal: Neck supple.  Cardiovascular:     Rate and Rhythm: Normal rate and regular rhythm.     Heart sounds: No murmur. No gallop.   Pulmonary:     Effort: Pulmonary effort is normal.     Breath sounds: Normal breath sounds. No wheezing or rales.  Neurological:     General: No focal deficit present.     Mental Status: She is alert and oriented to person, place, and time.     Cranial Nerves: No cranial nerve deficit.     Motor: No weakness.     Coordination: Coordination normal.     Gait: Gait normal.  Psychiatric:        Mood and Affect: Mood normal.        Behavior: Behavior normal.        Assessment:     #1 transient loss of consciousness.  Yesterday made third episode since July.  Etiology not clear.  No clear provoking factors or typical setting for vasovagal syncope.  Even though daughter states that she appeared to be "staring off "yesterday prior to episode not clear this represents seizure activity -We elected not to get any further labs since these were done at [facility] recently last week as well as EKG -Consider EEG to further evaluate -Recommend follow-up with EP specialist in cardiology  #2 history of atrial fibrillation with rapid ventricular response.  Appears to be in sinus rhythm today by exam (EKG not repeated) -Continue Xarelto and diltiazem     Plan:     As above.  Pt also advised to avoid driving for now. Follow up immediately for any recurrent syncope or other new symptoms.  Eulas Post MD [facility]

## 2019-01-22 ENCOUNTER — Telehealth: Payer: Self-pay | Admitting: *Deleted

## 2019-01-22 NOTE — Telephone Encounter (Signed)
Copied from [place]. Topic: General - Inquiry >> Jan 19, 2019  4:25 PM Rainey Pines A wrote: Reason for CRM: Patient called to check the status of referral to her cardiologist. I advised pt that the office was closed. Patient would like a callback on status.

## 2019-01-22 NOTE — Telephone Encounter (Signed)
Called patient and gave her the [facility] information. Patient verbalized an understanding and thanked [place].

## 2019-01-22 NOTE — Telephone Encounter (Signed)
Sent to work que to schedule their office will contact pt to schedule direclty [place]  Address: [address], [place], [place] [address] Phone: [phone]

## 2019-01-23 ENCOUNTER — Telehealth: Payer: Self-pay | Admitting: Family Medicine

## 2019-01-23 NOTE — Telephone Encounter (Signed)
Despina Hick, are you able to see if this appointment can be made for a sooner appointment for the patient per Dr. Elease Hashimoto?

## 2019-01-23 NOTE — Telephone Encounter (Signed)
Copied from [place] [id number]. Topic: General - Other >> Jan 23, 2019 11:23 AM Keene Breath wrote: Reason for CRM: Patient called to inform that nurse or doctor that her referral to see Dr. Jens Som is not until June and patient would like to see the doctor earlier than June.  Patient wanted to know if there was anything Dr. Elease Hashimoto could do to push the appointment up sooner.  Please advise and call patient back at [phone]

## 2019-01-23 NOTE — Telephone Encounter (Signed)
Please see message. °

## 2019-01-23 NOTE — Telephone Encounter (Signed)
Let's have Stephanie [name] contact them to see if they can move up.

## 2019-02-13 ENCOUNTER — Telehealth: Payer: Self-pay

## 2019-02-13 NOTE — Telephone Encounter (Signed)
Called patient and let her know that unfortunately we are not able to do anything at this time to get her in earlier and she can call that office and see if the have a cancellation for Dr. Caryl [name] and see if her appointment can be moved up earlier. Patient verbalized an understanding.

## 2019-02-13 NOTE — Telephone Encounter (Signed)
We are trying to get this patient in sooner to see Dr. Caryl Comes and Neoma Laming was advised that they will not see this patient any sooner unless you call the DOD at [phone] to give in detail the extent of the reason why this patient should be seen sooner. They are not seeing patients due to COVID 19. As of now the patient has an appointment on 03/30/19.  Please advise.

## 2019-02-13 NOTE — Telephone Encounter (Signed)
In absence of recurrent syncope, I don't think we can get her in any sooner.  The push for earlier appt was coming from the patient.

## 2019-02-13 NOTE — Telephone Encounter (Signed)
I was told by heartcare that their were no sooner appt for Dr Caryl Comes and Dr Elease Hashimoto would have to speak to the DOD regarding this patient  informed megan of this

## 2019-02-16 ENCOUNTER — Telehealth: Payer: Self-pay | Admitting: Internal Medicine

## 2019-02-16 NOTE — Telephone Encounter (Signed)
Spoke with pt today who states she is feeling ok right now. She has been having some periodic episodes of syncope and blackouts.   She saw her PCP last month who recommended she schedule an appt to discuss with Dr Caryl Comes. She states she called and was told by scheduling the earliest availability was June. She then asked her PCP's office to send in an urgent request for a sooner appointment, which was also denied by someone. It is unclear as to who as this is not documented.  I apologized to Ms [facility]. If we had been made aware, I could have made arrangements for a sooner appointment given her acuity. I was able to move her appt up to next week.  We then discussed the etiology of syncope and blackouts. I explained syncope may be hard to determine as sometimes it is cardiovascular or neurological in nature. I advised her to stay hydrated this weekend, urinating every 2 hours with clear yellow urine. If she believes she is beginning to pass out, try to find a place to sit or lie down. She is not aware of any triggers. She understands she may take her Metoprolol for periods of AF if needed. She is currently on Xarelto.   In preparation for her virtual visit, she will have orthostatic blood pressure and heart rate readings available.   She has verbalized understanding of the above and will call with any additional needs.

## 2019-02-16 NOTE — Telephone Encounter (Signed)
I spoke with pt. She reports she woke up this AM around 6:30 and heart was beating hard. Felt irregular. Lasted off and on for about an hour.  Did not need to take prn Toprol. States she blacked out twice in April.  Has not blacked out since then.  Did feel light headed and dizzy earlier this week but she did breathing exercises and this helped. Is taking Xarelto Is feeling OK at present time. Will forward to Dr. Caryl Comes for review/recommendations.

## 2019-02-16 NOTE — Telephone Encounter (Signed)
New Message      Patient c/o Palpitations:  High priority if patient c/o lightheadedness, shortness of breath, or chest pain  1) How long have you had palpitations/irregular HR/ Afib? Are you having the symptoms now? Not right now but she did this morning   2) Are you currently experiencing lightheadedness, SOB or CP? No   3) Do you have a history of afib (atrial fibrillation) or irregular heart rhythm? Yes   4) Have you checked your BP or HR? (document readings if available): BP 149/88   5) Are you experiencing any other symptoms? Pt says she has blacked out. In April she blacked out twice. She said she saw her PC and he told her to see her cardiologist    Please call

## 2019-02-16 NOTE — Telephone Encounter (Signed)
Noted  

## 2019-02-20 ENCOUNTER — Telehealth (INDEPENDENT_AMBULATORY_CARE_PROVIDER_SITE_OTHER): Payer: 59 | Admitting: Internal Medicine

## 2019-02-20 ENCOUNTER — Encounter: Payer: Self-pay | Admitting: Internal Medicine

## 2019-02-20 ENCOUNTER — Other Ambulatory Visit: Payer: Self-pay

## 2019-02-20 VITALS — Ht 68.0 in | Wt 185.0 lb

## 2019-02-20 DIAGNOSIS — I10 Essential (primary) hypertension: Secondary | ICD-10-CM

## 2019-02-20 DIAGNOSIS — R55 Syncope and collapse: Secondary | ICD-10-CM

## 2019-02-20 DIAGNOSIS — I480 Paroxysmal atrial fibrillation: Secondary | ICD-10-CM | POA: Diagnosis not present
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
## 2019-02-23 ENCOUNTER — Telehealth (HOSPITAL_COMMUNITY): Payer: Self-pay | Admitting: Radiology

## 2019-02-23 NOTE — Telephone Encounter (Signed)

## 2019-02-27 ENCOUNTER — Ambulatory Visit (HOSPITAL_COMMUNITY): Payer: 59 | Attending: Cardiology

## 2019-02-27 ENCOUNTER — Other Ambulatory Visit: Payer: Self-pay

## 2019-02-27 DIAGNOSIS — I480 Paroxysmal atrial fibrillation: Secondary | ICD-10-CM

## 2019-02-27 DIAGNOSIS — I10 Essential (primary) hypertension: Secondary | ICD-10-CM | POA: Diagnosis present

## 2019-02-27 DIAGNOSIS — R55 Syncope and collapse: Secondary | ICD-10-CM | POA: Diagnosis present

## 2019-03-06 ENCOUNTER — Telehealth: Payer: Self-pay | Admitting: Internal Medicine

## 2019-03-06 NOTE — Telephone Encounter (Signed)
New message:    Patient calling concerning some results. Please call patient.

## 2019-03-06 NOTE — Telephone Encounter (Signed)
Pt calling to inquire on Echo results; I advised her they had resulted but Dr Caryl Comes has not made recommendations as of yet. I will forward her request to him to let him know she is waiting.   She verbalized understanding and had no additional questions.

## 2019-03-07 NOTE — Telephone Encounter (Signed)
Called and spoke with her about the echo

## 2019-03-22 ENCOUNTER — Other Ambulatory Visit: Payer: 59

## 2019-03-30 ENCOUNTER — Telehealth: Payer: 59 | Admitting: Internal Medicine

## 2019-04-04 ENCOUNTER — Other Ambulatory Visit (INDEPENDENT_AMBULATORY_CARE_PROVIDER_SITE_OTHER): Payer: 59

## 2019-04-04 ENCOUNTER — Other Ambulatory Visit: Payer: Self-pay

## 2019-04-04 DIAGNOSIS — E7800 Pure hypercholesterolemia, unspecified: Secondary | ICD-10-CM

## 2019-04-04 LAB — HEPATIC FUNCTION PANEL
ALT: 16 U/L (ref 0–35)
AST: 18 U/L (ref 0–37)
Albumin: 4.4 g/dL (ref 3.5–5.2)
Alkaline Phosphatase: 77 U/L (ref 39–117)
Bilirubin, Direct: 0.1 mg/dL (ref 0.0–0.3)
Total Bilirubin: 0.9 mg/dL (ref 0.2–1.2)
Total Protein: 6.9 g/dL (ref 6.0–8.3)

## 2019-04-04 LAB — LIPID PANEL
Cholesterol: 248 mg/dL — ABNORMAL HIGH (ref 0–200)
HDL: 86.8 mg/dL
LDL Cholesterol: 148 mg/dL — ABNORMAL HIGH (ref 0–99)
NonHDL: 161.49
Total CHOL/HDL Ratio: 3
Triglycerides: 68 mg/dL (ref 0.0–149.0)
VLDL: 13.6 mg/dL (ref 0.0–40.0)

## 2019-04-06 ENCOUNTER — Other Ambulatory Visit: Payer: Self-pay

## 2019-04-06 ENCOUNTER — Telehealth: Payer: Self-pay | Admitting: Family Medicine

## 2019-04-06 MED ORDER — FLUOXETINE HCL 20 MG PO CAPS: 20.0000 mg | ORAL_CAPSULE | Freq: Every day | ORAL | 3 refills | Status: DC

## 2019-04-06 MED ORDER — ROSUVASTATIN CALCIUM 40 MG PO TABS: 40.0000 mg | ORAL_TABLET | Freq: Every day | ORAL | 3 refills | Status: DC

## 2019-04-06 NOTE — Telephone Encounter (Signed)
Last filled 12/19/2018 for Qty of 90 with 3 RF. [facility]  Patient is requesting the RX be sent to [facility] to send new RX?

## 2019-04-06 NOTE — Telephone Encounter (Signed)
[facility]

## 2019-04-06 NOTE — Telephone Encounter (Signed)
Please see message.  Please advise. 

## 2019-04-06 NOTE — Telephone Encounter (Signed)
Medication Refill - Medication: FLUoxetine (PROZAC) 20 MG capsule   Has the patient contacted their pharmacy? Yes.  Pt states she has been out of this medication for 3 days. Please advise.  (Agent: If no, request that the patient contact the pharmacy for the refill.) (Agent: If yes, when and what did the pharmacy advise?)  Preferred Pharmacy (with phone number or street name):  [id number] [place], [place]  [place] [address]  Phone: [phone] Fax: [phone]  Not a 24 hour pharmacy; exact hours not known.     Agent: Please be advised that RX refills may take up to 3 business days. We ask that you follow-up with your pharmacy.

## 2019-04-06 NOTE — Telephone Encounter (Signed)
OK to refill for one year.  

## 2019-05-05 ENCOUNTER — Other Ambulatory Visit: Payer: Self-pay | Admitting: Family Medicine

## 2019-06-06 ENCOUNTER — Other Ambulatory Visit: Payer: Self-pay

## 2019-06-06 ENCOUNTER — Ambulatory Visit: Payer: 59 | Admitting: Family Medicine

## 2019-06-06 VITALS — BP 162/84 | Temp 97.1°F | Resp 18 | Ht 67.5 in | Wt 184.3 lb

## 2019-06-06 DIAGNOSIS — M7061 Trochanteric bursitis, right hip: Secondary | ICD-10-CM

## 2019-06-06 MED ORDER — METHYLPREDNISOLONE ACETATE 80 MG/ML IJ SUSP
80.0000 mg | Freq: Once | INTRAMUSCULAR | Status: AC
  Administered 2019-06-06: 15:00:00 80 mg via INTRAMUSCULAR

## 2019-06-06 NOTE — Patient Instructions (Signed)
Hip Bursitis  Hip bursitis is inflammation of a fluid-filled sac (bursa) in the hip joint. The bursa prevents the bones in the hip joint from rubbing against each other. Hip bursitis can cause mild to moderate pain, and symptoms often come and go over time. What are the causes? This condition may be caused by:  Injury to the hip.  Overuse of the muscles that surround the hip joint.  Previous injury or surgery of the hip.  Arthritis or gout.  Diabetes.  Thyroid disease.  Infection. In some cases, the cause may not be known. What are the signs or symptoms? Symptoms of this condition include:  Mild or moderate pain in the hip area. Pain may get worse with movement.  Tenderness and swelling of the hip, especially on the outer side of the hip.  In rare cases, the bursa may become infected. This may cause a fever, as well as warmth and redness in the area. Symptoms may come and go. How is this diagnosed? This condition may be diagnosed based on:  A physical exam.  Your medical history.  X-rays.  Removal of fluid from your inflamed bursa for testing (biopsy). You may be sent to a health care provider who specializes in bone diseases (orthopedist) or a provider who specializes in joint inflammation (rheumatologist). How is this treated? This condition is treated by resting, icing, applying pressure (compression), and raising (elevating) the injured area. This is called RICE treatment. In some cases, this may be enough to make your symptoms go away. Treatment may also include:  Using crutches.  Draining fluid out of the bursa to help relieve swelling.  Injecting medicine that helps to reduce inflammation (cortisone).  Additional medicines if the bursa is infected. Follow these instructions at home: Managing pain, stiffness, and swelling   If directed, put ice on the painful area. ? Put ice in a plastic bag. ? Place a towel between your skin and the bag. ? Leave the ice  on for 20 minutes, 2-3 times a day. ? Raise (elevate) your hip above the level of your heart as much as you can without pain. To do this, try putting a pillow under your hips while you lie down. Activity  Return to your normal activities as told by your health care provider. Ask your health care provider what activities are safe for you.  Rest and protect your hip as much as possible until your pain and swelling get better. General instructions  Take over-the-counter and prescription medicines only as told by your health care provider.  Wear compression wraps only as told by your health care provider.  Do not use your hip to support your body weight until your health care provider says that you can. Use crutches as told by your health care provider.  Gently massage and stretch your injured area as often as is comfortable.  Keep all follow-up visits as told by your health care provider. This is important. How is this prevented?  Exercise regularly, as told by your health care provider.  Warm up and stretch before being active.  Cool down and stretch after being active.  If an activity irritates your hip or causes pain, avoid the activity as much as possible.  Avoid sitting down for long periods at a time. Contact a health care provider if you:  Have a fever.  Develop new symptoms.  Have difficulty walking or doing everyday activities.  Have pain that gets worse or does not get better with medicine.  Develop red skin or a feeling of warmth in your hip area. Get help right away if you:  Cannot move your hip.  Have severe pain. Summary  Hip bursitis is inflammation of a fluid-filled sac (bursa) in the hip joint.  Hip bursitis can cause mild to moderate pain, and symptoms often come and go over time.  This condition is treated with rest, ice, compression, elevation, and medicines. This information is not intended to replace advice given to you by your health care  provider. Make sure you discuss any questions you have with your health care provider. Document Released: 03/19/2002 Document Revised: 06/05/2018 Document Reviewed: 06/05/2018 Elsevier Patient Education  2020 [place].  Monitor blood pressure and be in touch if consistently > 140/90.

## 2019-06-06 NOTE — Progress Notes (Signed)
  Subjective:     Patient ID: Stephanie [name], female   DOB: 23-Feb-1956, 63 y.o.   MRN: [id number]  HPI Patient is seen with right lateral hip pain.  Started about a year ago.  Especially worse past couple weeks.  Has taken ibuprofen without much relief. No injury.   Pain is relatively mild today but has been fairly severe at times.  Achy pain.  Symptoms worse with walking.  Denies any groin pain or anterior hip pain.  No back pain.  Denies any lower extremity numbness or weakness.  Pain is confined over the greater trochanteric bursa region.  Has not tried any icing.  Past Medical History:  Diagnosis Date  . Diverticulitis 06/2016  . Hyperlipidemia   . Hypertension   . Paroxysmal atrial fibrillation ([place]) 01/26/2016   No past surgical history on file.  reports that she quit smoking about 9 years ago. Her smoking use included cigarettes. She has a 30.00 pack-year smoking history. She has never used smokeless tobacco. She reports current alcohol use of about 2.0 standard drinks of alcohol per week. She reports that she does not use drugs. family history includes Alzheimer's disease in her mother; Heart disease (age of onset: 75) in her brother and father; Sudden death in an other family member. No Known Allergies   Review of Systems  Constitutional: Negative for chills and fever.  Musculoskeletal: Negative for back pain.  Neurological: Negative for weakness and numbness.       Objective:   Physical Exam Constitutional:      Appearance: Normal appearance.  Cardiovascular:     Rate and Rhythm: Normal rate.  Pulmonary:     Effort: Pulmonary effort is normal.     Breath sounds: Normal breath sounds.  Musculoskeletal:     Right lower leg: No edema.     Left lower leg: No edema.     Comments: Excellent range of motion right hip.  She has tenderness confined to the great trochanteric bursa region.  Does not have a similar pain on the left side  Neurological:     Mental Status: She is  alert.     Comments: Full strength lower extremities        Assessment:     #1 right lateral hip pain.  Suspect greater trochanteric bursitis  #2 hypertension which is poorly controlled by today's reading.  Has been better controlled in the past.    Plan:     -Recommend close monitoring of blood pressure next couple weeks at home and if consistently greater than [id number] systolic be in touch  -Discussed risk and benefits of steroid injection into bursa region right lateral hip occluding risk of bruising, pain, bleeding, infection.  Patient consented.  Prepped the skin with Betadine.  Using 25-gauge 1 inch needle injected 80 mg of Medrol and 2 cc of plain Xylocaine and patient tolerated well -She will try some icing tonight.  Be in touch in 1 week if not improving  Eulas Post MD [facility]

## 2019-06-13 ENCOUNTER — Encounter: Payer: Self-pay | Admitting: Family Medicine

## 2019-06-13 MED ORDER — VALSARTAN 80 MG PO TABS: 80.0000 mg | ORAL_TABLET | Freq: Every day | ORAL | 1 refills | Status: DC

## 2019-06-13 NOTE — Telephone Encounter (Signed)
Patient called earlier with some persistent right hip pain.  She had tenderness over the greater trochanteric bursa and we injected with steroid.  She states the pain may be slightly better.  She wonders if she may have some "sciatica".  She has not noticed any redness or swelling at the site of injection.  Her main reason for calling was blood pressure yesterday 0000000 systolic.  She had some headache.  She has had several readings over 140.  She takes diltiazem 180 mg daily. We discussed adding valsartan 80 mg once daily and recommend office follow-up to reassess blood pressure and hip within about 3 weeks

## 2019-06-27 ENCOUNTER — Other Ambulatory Visit: Payer: Self-pay | Admitting: Nurse Practitioner

## 2019-06-27 NOTE — Telephone Encounter (Signed)
Pt last saw Dr Caryl Comes 02/20/19 telemedicine visit Covid-19, last labs 01/10/19 Creat 0.70, age 63, weight 83.6kg, CrCl 107.15, based on CrCl pt is on appropriate dosage of Xarelto 20mg  QD.  Will refill rx.

## 2019-07-11 ENCOUNTER — Encounter: Payer: Self-pay | Admitting: Family Medicine

## 2019-07-11 MED ORDER — GABAPENTIN 300 MG PO CAPS: 300.0000 mg | ORAL_CAPSULE | Freq: Two times a day (BID) | ORAL | 3 refills | Status: DC

## 2019-07-11 NOTE — Telephone Encounter (Signed)
Patient is having some radiculitis type pains radiating into the thigh.  Recent injection over bursa region did not help.  We have suggested she consider trial of gabapentin 300 mg at night and then can increase to twice daily.  Touch base if not helping in 1 to 2 weeks.  I sent in the gabapentin to her pharmacy

## 2019-08-14 ENCOUNTER — Encounter: Payer: Self-pay | Admitting: Family Medicine

## 2019-08-14 ENCOUNTER — Ambulatory Visit: Payer: 59 | Admitting: Family Medicine

## 2019-08-14 ENCOUNTER — Other Ambulatory Visit: Payer: Self-pay

## 2019-08-14 VITALS — BP 150/70 | HR 60 | Temp 97.7°F | Ht 67.0 in | Wt 182.8 lb

## 2019-08-14 DIAGNOSIS — R590 Localized enlarged lymph nodes: Secondary | ICD-10-CM

## 2019-08-14 NOTE — Progress Notes (Signed)
   Subjective:    Patient ID: Stephanie [name], female    DOB: 13-Mar-1956, 63 y.o.   MRN: [id number]  HPI Here to check on a lump above the right collar bone that came up a week ago. This area was slightly tender at the time, but in the past 2 days the tenderness has resolved and the lump has gotten much smaller. She feels fine in general, no fever or SOB or cough. Of note she got a flu shot in the right deltoid muscle one day before the lump appeared.    Review of Systems  Constitutional: Negative.   Respiratory: Negative.   Cardiovascular: Negative.        Objective:   Physical Exam Constitutional:      Appearance: Normal appearance.  HENT:     Right Ear: Tympanic membrane, ear canal and external ear normal.     Left Ear: Tympanic membrane, ear canal and external ear normal.     Nose: Nose normal.     Mouth/Throat:     Pharynx: Oropharynx is clear.  Eyes:     Conjunctiva/sclera: Conjunctivae normal.  Neck:     Musculoskeletal: Normal range of motion. No muscular tenderness.  Cardiovascular:     Rate and Rhythm: Normal rate and regular rhythm.     Pulses: Normal pulses.     Heart sounds: Normal heart sounds.  Pulmonary:     Effort: Pulmonary effort is normal. No respiratory distress.     Breath sounds: Normal breath sounds. No stridor. No wheezing, rhonchi or rales.     Comments: The right supraclavicular area is slightly more prominent than the left. No distinct lumps are felt. No tenderness  Chest:     Chest wall: No tenderness.  Lymphadenopathy:     Cervical: No cervical adenopathy.  Neurological:     Mental Status: She is alert.           Assessment & Plan:  She seems to have had some transient lymphadenitis related to the flu shot. This appears to be resolving. She will monitor herself and follow up as needed. Alysia Penna, MD

## 2019-08-24 ENCOUNTER — Ambulatory Visit: Payer: 59 | Admitting: Family Medicine

## 2019-08-24 ENCOUNTER — Encounter: Payer: Self-pay | Admitting: Family Medicine

## 2019-08-24 ENCOUNTER — Other Ambulatory Visit: Payer: Self-pay

## 2019-08-24 ENCOUNTER — Ambulatory Visit: Payer: Self-pay | Admitting: *Deleted

## 2019-08-24 VITALS — BP 160/90 | HR 52 | Temp 97.8°F | Ht 67.0 in | Wt 180.0 lb

## 2019-08-24 DIAGNOSIS — I10 Essential (primary) hypertension: Secondary | ICD-10-CM | POA: Diagnosis not present

## 2019-08-24 DIAGNOSIS — F419 Anxiety disorder, unspecified: Secondary | ICD-10-CM | POA: Diagnosis not present

## 2019-08-24 MED ORDER — ALPRAZOLAM 0.5 MG PO TABS: 0.5000 mg | ORAL_TABLET | Freq: Three times a day (TID) | ORAL | 0 refills | Status: DC | PRN

## 2019-08-24 NOTE — Telephone Encounter (Signed)
Pt has been seen for the issue.

## 2019-08-24 NOTE — Progress Notes (Signed)
   Subjective:    Patient ID: Stephanie [name], female    DOB: 1955/11/24, 63 y.o.   MRN: [id number]  HPI Here for elevated BP readings. She normally sees Dr. Elease Hashimoto and he had started her on Valsartan in August, along with her usual Diltiazem. However she says she only took one tablet and it made her dizzy, so she never took it again. Now her BP has very quite high at home this week. It was 206/101 when she got up this morning, and it was 173/90 just before she left to come to this appt. She has been feeling bad this week with fatigue and headaches. No chest pain or SOB. She also says her stress levels have been extremely high while dealing with some family issues. She takes Prozac 20 mg daily, but she took a Xanax last night that one of her daughters had given to her, and it helped her relax quite well. She asks to have a small supply to use as needed. She says she is now willing to give the Valsartan another try.    Review of Systems  Constitutional: Negative.   Respiratory: Negative.   Cardiovascular: Negative.   Neurological: Negative.        Objective:   Physical Exam Constitutional:      Appearance: Normal appearance.  Cardiovascular:     Rate and Rhythm: Normal rate and regular rhythm.     Pulses: Normal pulses.     Heart sounds: Normal heart sounds.  Pulmonary:     Effort: Pulmonary effort is normal.     Breath sounds: Normal breath sounds.  Neurological:     General: No focal deficit present.     Mental Status: She is alert and oriented to person, place, and time.           Assessment & Plan:  Her HTN is not well controlled and I did advise her to start the Valsartan again. She said she would and she would follow up with Dr. Elease Hashimoto in several weeks. As for the anxiety, I recommended she increase the Prozac to 40 mg a day, but she is very hesitant to do this. Instead I did write for a small supply of Xanax for her to use as needed. She will also follow up with Dr.  Elease Hashimoto about this issue.  Alysia Penna, MD

## 2019-08-24 NOTE — Telephone Encounter (Signed)
  Pt called in c/o her BP being elevated and feeling pressure in her head.   Denies any other symptoms. She revealed to me that Dr. Elease Hashimoto started her on valsartan a month ago.    She took the first dose and it made her very dizzy so she did not take it any more.    When asked if she had let Dr. Elease Hashimoto now this she said,  "No" I guess I should have".    I did some education to please call any time a new medication is giving you side effects.   Don't just stop taking it.   She verbalized understanding.  Yesterday her BP was 206/101 so she took a valsartan.   She didn't notice that it made her dizzy but "I was laying down after I took it".  This morning her daughter who is an Therapist, sports checked her BP manually it was still elevated.  The protocol is to be seen within 4 hours.   I warm transferred the call into Dr. Erick Blinks office to [facility] to be scheduled.   The only  Time available today was 1:30 so they are going to schedule her for that time.  I went over the care advice and she verbalized understanding.  I sent my notes to the office.  Reason for Disposition . [id number] Systolic BP  >= A999333 OR Diastolic >= 123456  AND [id number] having NO cardiac or neurologic symptoms  Answer Assessment - Initial Assessment Questions 1. BLOOD PRESSURE: "What is the blood pressure?" "Did you take at least two measurements 5 minutes apart?"     177/87 first thing this morning.   173/107 the 2nd BP.   2. ONSET: "When did you take your blood pressure?"     This morning   3. HOW: "How did you obtain the blood pressure?" (e.g., visiting nurse, automatic home BP monitor)     Daughter checked it manually.   She's an Therapist, sports 4. HISTORY: "Do you have a history of high blood pressure?"     Yes5. MEDICATIONS: "Are you taking any medications for blood pressure?" "Have you missed any doses recently?"     I do take medication for my BP. Dr. Elease Hashimoto gave me Valsartan a month ago.   It made me very dizzy.   I took the valsartan yesterday  since my BP was high yesterday.   I don't know if it made me dizzy or not.    I was laying down.   I didn't notice being dizzy when I was walking after taking it.   Dr. Elease Hashimoto was not made aware that I had stopped taking the valsartan. 6. OTHER SYMPTOMS: "Do you have any symptoms?" (e.g., headache, chest pain, blurred vision, difficulty breathing, weakness)     I feel pressure in my head but not a headache.  No visual changes.   Denies chest pain or shortness of breath 7. PREGNANCY: "Is there any chance you are pregnant?" "When was your last menstrual period?"     N/A due to age  Protocols used: [place]

## 2019-09-02 IMAGING — DX CHEST - 2 VIEW
2 series · 2 of 2 positions shown · non-contrast
Comparison: Chest x-ray dated May 06, 2018.

CLINICAL DATA: Chest pain and indigestion.

EXAM:
CHEST - 2 VIEW

[chest pa]
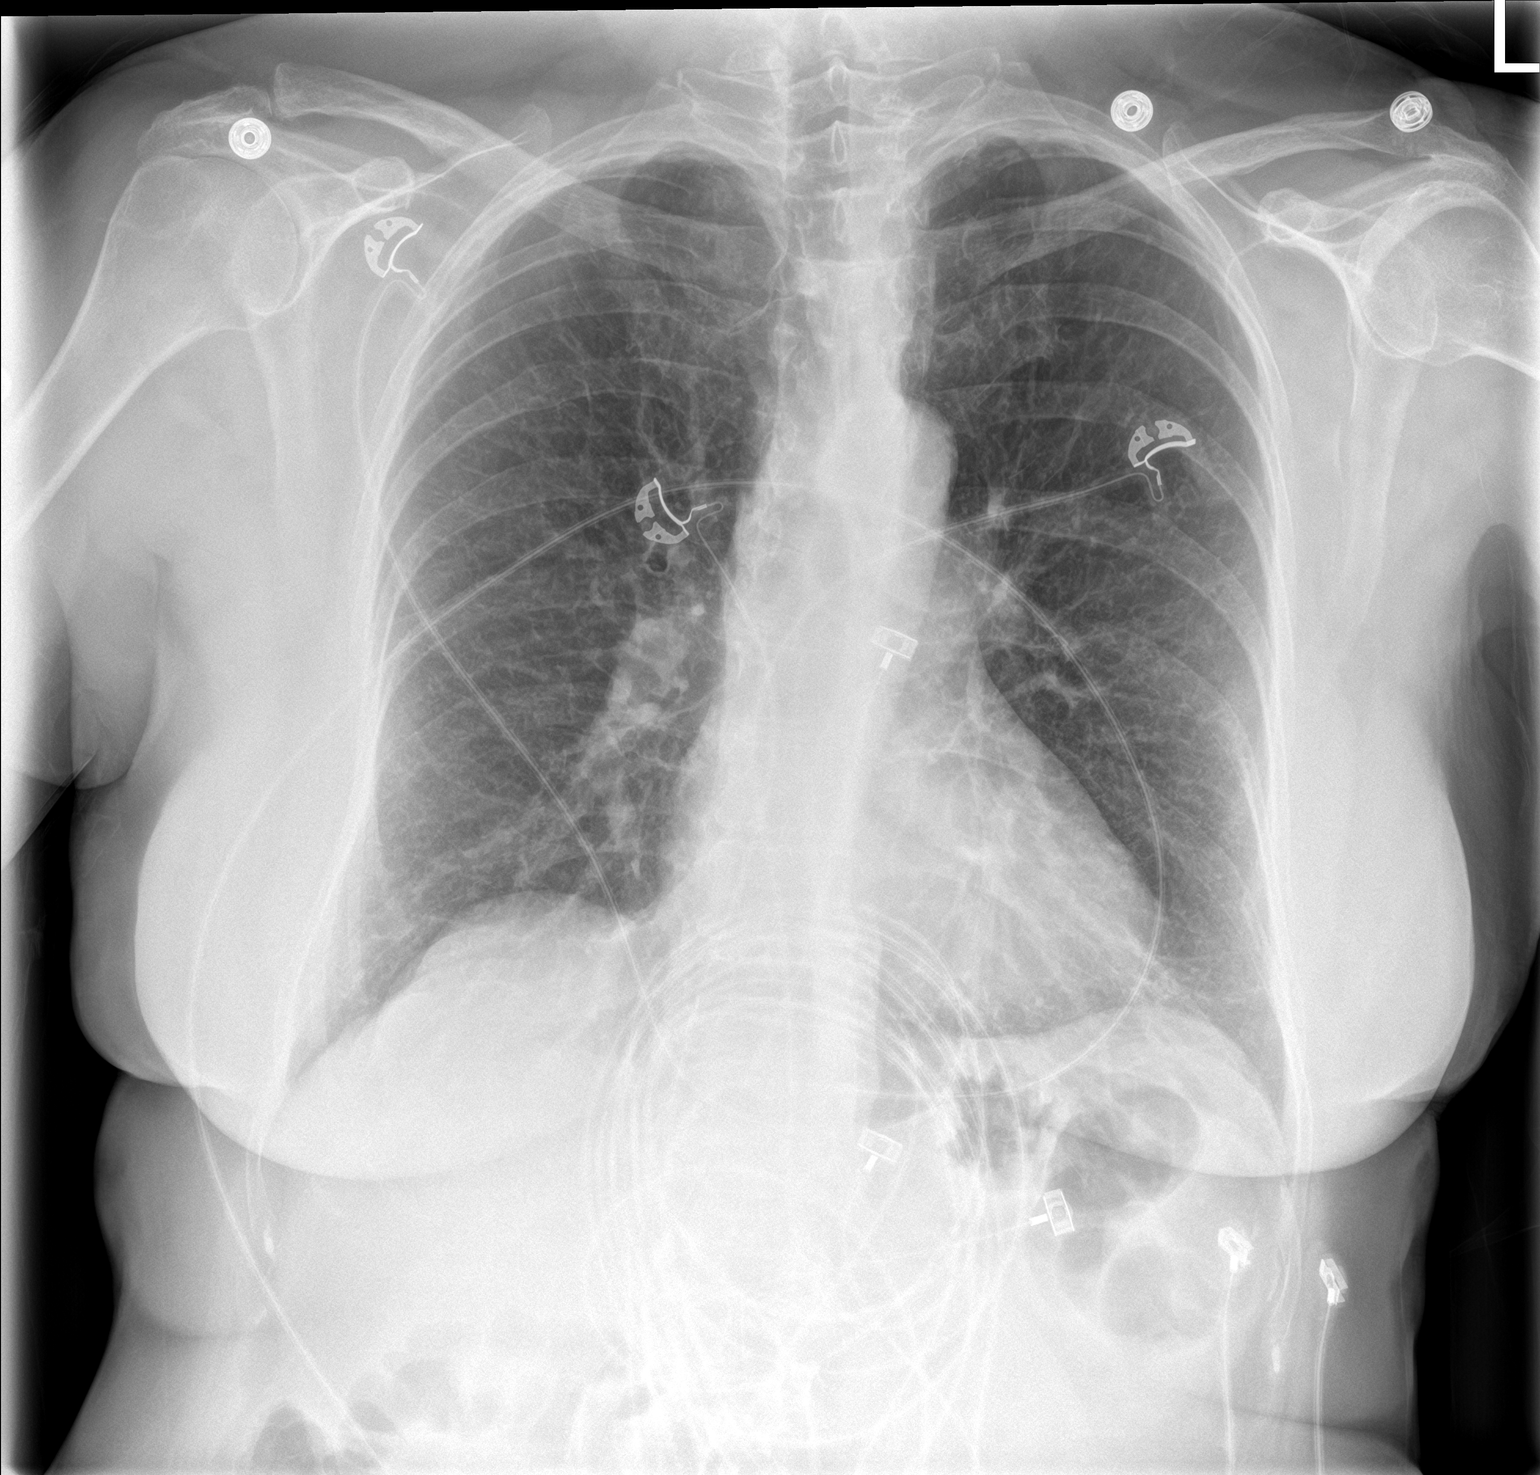

[chest lat]
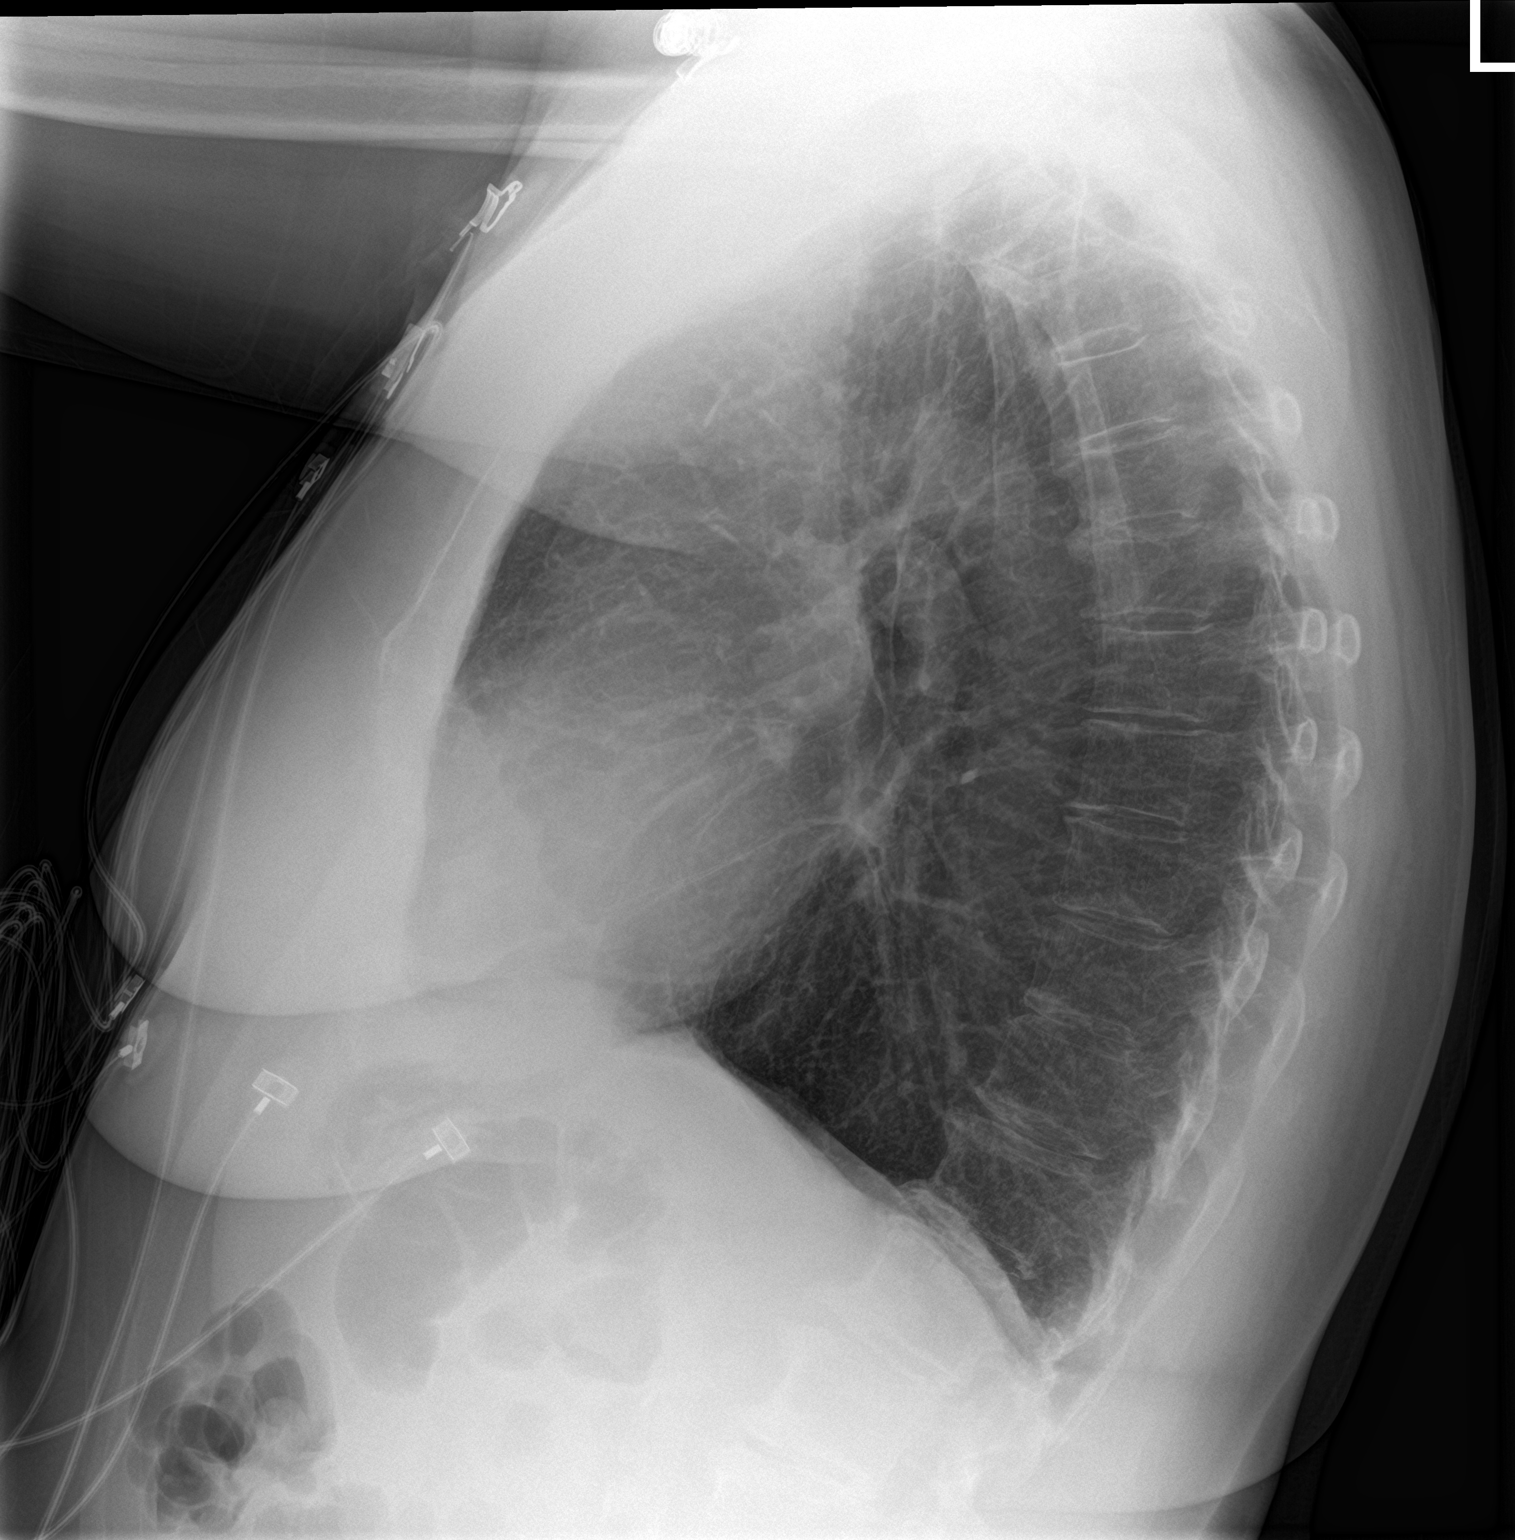

[2 of 2 positions shown; findings below may reference images not displayed]

FINDINGS: The heart size and mediastinal contours are within normal limits.
Normal pulmonary vascularity. Chronically coarsened interstitial
markings are similar to prior study and likely smoking-related. No
focal consolidation, pleural effusion, or pneumothorax. No acute
osseous abnormality.
IMPRESSION: No active cardiopulmonary disease.

## 2019-10-27 ENCOUNTER — Encounter: Payer: Self-pay | Admitting: Family Medicine

## 2019-10-29 ENCOUNTER — Other Ambulatory Visit: Payer: Self-pay

## 2019-10-29 MED ORDER — FLUOXETINE HCL 20 MG PO CAPS: ORAL_CAPSULE | ORAL | 0 refills | Status: DC

## 2020-01-18 ENCOUNTER — Ambulatory Visit (INDEPENDENT_AMBULATORY_CARE_PROVIDER_SITE_OTHER): Payer: Medicare Other

## 2020-01-18 ENCOUNTER — Encounter: Payer: Self-pay | Admitting: Family Medicine

## 2020-01-18 ENCOUNTER — Ambulatory Visit (INDEPENDENT_AMBULATORY_CARE_PROVIDER_SITE_OTHER): Payer: Medicare Other | Admitting: Family Medicine

## 2020-01-18 ENCOUNTER — Other Ambulatory Visit: Payer: Self-pay

## 2020-01-18 VITALS — BP 138/74 | HR 71 | Temp 98.1°F | Wt 186.2 lb

## 2020-01-18 DIAGNOSIS — G8929 Other chronic pain: Secondary | ICD-10-CM

## 2020-01-18 DIAGNOSIS — M1611 Unilateral primary osteoarthritis, right hip: Secondary | ICD-10-CM | POA: Diagnosis not present

## 2020-01-18 DIAGNOSIS — M25551 Pain in right hip: Secondary | ICD-10-CM

## 2020-01-18 DIAGNOSIS — M25561 Pain in right knee: Secondary | ICD-10-CM

## 2020-01-18 MED ORDER — ALPRAZOLAM 0.5 MG PO TABS: 0.5000 mg | ORAL_TABLET | Freq: Three times a day (TID) | ORAL | 0 refills | Status: AC | PRN

## 2020-01-18 NOTE — Progress Notes (Signed)
  Subjective:     Patient ID: Stephanie WIERINGA, female   DOB: 1955-11-10, 64 y.o.   MRN: [id number]  HPI   Ashanna is seen with pain which has gone on for now over a year which is somewhat poorly localized and involves both right hip and knee region.  She had previous injection of her right trochanteric bursa months ago without any improvement.  She denies any specific injury.  She states she has some stiffness and pain in her hip and knee especially after long periods of rest and sometimes worse going up stairs.  She has taken Advil with minimal relief.  No numbness.  No definite weakness.  Denies low back pain.  Pain is frequently achy quality and sometimes moderate severity.  We discussed possible imaging previously.  Denies any recent fever, abdominal pain, dysuria.  She had Moderna vaccines February 11 and March 12  Past Medical History:  Diagnosis Date  . Diverticulitis 06/2016  . Hyperlipidemia   . Hypertension   . Paroxysmal atrial fibrillation ([place]) 01/26/2016   No past surgical history on file.  reports that she quit smoking about 9 years ago. Her smoking use included cigarettes. She has a 30.00 pack-year smoking history. She has never used smokeless tobacco. She reports current alcohol use of about 2.0 standard drinks of alcohol per week. She reports that she does not use drugs. family history includes Alzheimer's disease in her mother; Heart disease (age of onset: 6) in her brother and father; Sudden death in an other family member. No Known Allergies   Wt Readings from Last 3 Encounters:  01/18/20 186 lb 3.2 oz (84.5 kg)  08/24/19 180 lb (81.6 kg)  08/14/19 182 lb 12.8 oz (82.9 kg)     Review of Systems  Constitutional: Negative for appetite change, chills, fever and unexpected weight change.  Respiratory: Negative for cough and shortness of breath.   Cardiovascular: Negative for chest pain.  Gastrointestinal: Negative for abdominal pain.  Genitourinary: Negative for dysuria  and flank pain.  Neurological: Negative for weakness and numbness.       Objective:   Physical Exam Vitals reviewed.  Constitutional:      Appearance: Normal appearance.  Cardiovascular:     Rate and Rhythm: Normal rate.  Pulmonary:     Effort: Pulmonary effort is normal.     Breath sounds: Normal breath sounds.  Musculoskeletal:     Right lower leg: No edema.     Left lower leg: No edema.     Comments: She has good range of motion right knee and right hip.  No significant crepitus with knee movement.  No lateral hip tenderness.  Straight leg raise are negative.  Neurological:     Mental Status: She is alert.     Comments: Full strength lower extremities.  Symmetric reflexes ankle and knee.        Assessment:     Chronic poorly localized pain right lower extremity.  This seems to be more localized to the hip and knee and she does complain of some stiffness and pain in both joints after prolonged periods of sitting.  She has fairly good range of motion both.  Doubt lumbar radiculitis.    Plan:     -Obtain x-rays right knee and right hip to further assess.  -If x-ray is unrevealing, consider sports medicine referral to further evaluate  Eulas Post MD [facility]

## 2020-01-21 ENCOUNTER — Other Ambulatory Visit: Payer: Self-pay

## 2020-01-21 DIAGNOSIS — G8929 Other chronic pain: Secondary | ICD-10-CM

## 2020-01-21 DIAGNOSIS — M25551 Pain in right hip: Secondary | ICD-10-CM

## 2020-01-21 DIAGNOSIS — M25561 Pain in right knee: Secondary | ICD-10-CM

## 2020-01-31 ENCOUNTER — Ambulatory Visit: Payer: Self-pay

## 2020-01-31 ENCOUNTER — Ambulatory Visit: Payer: Medicare Other | Admitting: Family Medicine

## 2020-01-31 ENCOUNTER — Other Ambulatory Visit: Payer: Self-pay

## 2020-01-31 ENCOUNTER — Encounter: Payer: Self-pay | Admitting: Family Medicine

## 2020-01-31 VITALS — BP 138/72 | HR 61 | Ht 67.0 in | Wt 186.4 lb

## 2020-01-31 DIAGNOSIS — M25551 Pain in right hip: Secondary | ICD-10-CM

## 2020-01-31 DIAGNOSIS — M25561 Pain in right knee: Secondary | ICD-10-CM | POA: Diagnosis not present

## 2020-01-31 DIAGNOSIS — G8929 Other chronic pain: Secondary | ICD-10-CM | POA: Diagnosis not present

## 2020-01-31 NOTE — Patient Instructions (Signed)
Thank you for coming in today. I think you have trochanteric bursitis or hip abductor tendonitis.  Do the side leg raises 30 reps 2-3x daily.  Do the cross over stretch and figure 4 or prirformis stretch.  If not getting better let me know and I will order PT.  Give it 2-4 weeks.   For the knee lets start with voltaren gel up 4x daily.   Recheck in 6 weeks.  Return sooner if needed.

## 2020-01-31 NOTE — Progress Notes (Signed)
Subjective:    CC: R hip and R knee pain  I, Molly Weber, LAT, ATC, am serving as scribe for Dr. Lynne Leader.  HPI: Pt is a 64 y/o female presenting w/ c/o R hip and R knee pain x several years w/ no known MOI.  She describes her pain as moderate, aching pain.  She denies any pain radiating below the level of the knee.  No weakness or numbness distally.  She had greater trochanteric injection November 2020 which did not help much.  She denies any injury.  Radiating pain: Yes from lateral hip to knee but also along ant thigh Low back pain: Yes Mechanical symptoms: No Aggravating factors: ascending stairs; transitioning to stand after prolonged sitting: laying down to go to sleep Treatments tried: Prior R GT injection w/ no relief noted Nov 2020; Advil  Diagnostic imaging: R hip and R knee XR- 01/18/20  Pertinent review of Systems: No fevers or chills  Relevant historical information: Paroxysmal atrial fibrillation and hypertension.   Objective:    Vitals:   01/31/20 1443  BP: 138/72  Pulse: 61  SpO2: 96%   General: Well Developed, well nourished, and in no acute distress.   MSK: L-spine: Nontender spinal midline normal lumbar motion.  Negative straight leg raise test and slump test bilaterally. Reflexes equal bilateral lower extremities.  Right hip: Normal-appearing Normal motion. Not tender to palpation. Hip abduction strength diminished 4/5.  Resisted strength testing reproduces pain somewhat. Hip external rotation strength diminished 4/5.  Resisted strength testing also reproduces pain somewhat. Negative FABER test. Positive FADIR test. Figure 4 stretching also reproduces pain.  Right knee: Normal-appearing Normal motion with mild crepitation. Not particularly tender palpation. Stable ligamentous exam.  Lab and Radiology Results  DG Knee Complete 4 Views Right  Result Date: 01/18/2020 CLINICAL DATA:  Progressive right knee pain EXAM: RIGHT KNEE - COMPLETE  4+ VIEW COMPARISON:  None. FINDINGS: No fracture or malalignment. Mild patellofemoral and medial joint space degenerative change. No significant knee effusion. IMPRESSION: Mild degenerative changes Electronically Signed   By: Donavan Foil M.D.   On: 01/18/2020 20:53   DG Hip Unilat W OR W/O Pelvis 2-3 Views Right  Result Date: 01/18/2020 CLINICAL DATA:  Progressive right hip pain EXAM: DG HIP (WITH OR WITHOUT PELVIS) 2-3V RIGHT COMPARISON:  None. FINDINGS: SI joints are patent. Pubic symphysis and rami are intact. No fracture or malalignment. Minimal degenerative change of the right hip IMPRESSION: Minimal degenerative change.  Otherwise negative Electronically Signed   By: Donavan Foil M.D.   On: 01/18/2020 20:52   I, Lynne Leader, personally (independently) visualized and performed the interpretation of the images attached in this note.    Impression and Recommendations:    Assessment and Plan: 64 y.o. female with right leg pain.  Patient has pain extending from the buttocks and lateral hip down to the knee.  This is been ongoing for years and not improved with reasonable work-up and trials of treatment including trochanter injection. Main diagnostic finding today is weakness to hip abduction and external rotation along with reproduced pain with resisted strength testing and stretching of the hip abductors and piriformis muscles. Functionally this is behaving like trochanteric bursitis or hip abductor tendinopathy without the point tenderness at the greater trochanter. This probably explains why she failed to improve with greater trochanter injection.  Discussed treatment options.  Plan for trial of home exercise program.  I taught her the home exercise program in clinic today.  If  not improving she will notify me and she will proceed with formal physical therapy at that point.  She does also have some pain originating from the right knee as well.  This is due to mild DJD seen on x-ray.   Discussed options.  Plan for initial treatment trial with Voltaren gel topically.  If not better reasonable to consider injection.  Recheck 6 weeks.  Return sooner if needed.   Orders Placed This Encounter  Procedures  . [place] LIMITED JOINT SPACE STRUCTURES LOW RIGHT(NO LINKED CHARGES)    Order Specific Question:   Reason for Exam (SYMPTOM  OR DIAGNOSIS REQUIRED)    Answer:   R hip and knee pain    Order Specific Question:   Preferred imaging location?    Answer:   [place]   No orders of the defined types were placed in this encounter.   Discussed warning signs or symptoms. Please see discharge instructions. Patient expresses understanding.   The above documentation has been reviewed and is accurate and complete Lynne Leader

## 2020-02-14 ENCOUNTER — Other Ambulatory Visit: Payer: Self-pay | Admitting: Family Medicine

## 2020-03-03 ENCOUNTER — Other Ambulatory Visit: Payer: Self-pay | Admitting: Family Medicine

## 2020-03-13 ENCOUNTER — Ambulatory Visit: Payer: Medicare Other | Admitting: Family Medicine

## 2020-04-01 ENCOUNTER — Other Ambulatory Visit: Payer: Self-pay

## 2020-04-01 ENCOUNTER — Encounter: Payer: Self-pay | Admitting: Family Medicine

## 2020-04-01 ENCOUNTER — Ambulatory Visit: Payer: Medicare Other | Admitting: Family Medicine

## 2020-04-01 ENCOUNTER — Ambulatory Visit (INDEPENDENT_AMBULATORY_CARE_PROVIDER_SITE_OTHER): Payer: Medicare Other

## 2020-04-01 VITALS — BP 150/80 | HR 63 | Ht 67.0 in | Wt 186.0 lb

## 2020-04-01 DIAGNOSIS — G8929 Other chronic pain: Secondary | ICD-10-CM | POA: Diagnosis not present

## 2020-04-01 DIAGNOSIS — M25561 Pain in right knee: Secondary | ICD-10-CM

## 2020-04-01 DIAGNOSIS — M25551 Pain in right hip: Secondary | ICD-10-CM | POA: Diagnosis not present

## 2020-04-01 DIAGNOSIS — M5416 Radiculopathy, lumbar region: Secondary | ICD-10-CM

## 2020-04-01 DIAGNOSIS — M5136 Other intervertebral disc degeneration, lumbar region: Secondary | ICD-10-CM | POA: Diagnosis not present

## 2020-04-01 MED ORDER — PREDNISONE 50 MG PO TABS: 50.0000 mg | ORAL_TABLET | Freq: Every day | ORAL | 0 refills | Status: DC

## 2020-04-01 MED ORDER — GABAPENTIN 300 MG PO CAPS: 300.0000 mg | ORAL_CAPSULE | Freq: Three times a day (TID) | ORAL | 3 refills | Status: DC | PRN

## 2020-04-01 NOTE — Progress Notes (Signed)
I, Wendy Poet, LAT, ATC, am serving as scribe for Dr. Lynne Leader.  Stephanie [name] is a 64 y.o. female who presents to [place] at [facility] today for f/u of R hip pain radiating to her R knee. She was last seen by Dr. Georgina Snell on 01/31/20 and was provided a HEP by Dr. Georgina Snell focusing on sidelying hip aBd and ITB/piriformis stretching.  She had a prior R GT injection in Nov 2020 that did not help w/ her symptoms.  Since her last visit, pt reports that her L hip and anterior thigh pain remain about the same.  She has been doing her HEP sporadically.  She has pain radiating from the lateral hip to the anterior thigh to the anterior knee to the medial anterior lower leg.  This is worse with standing and walking.  Diagnostic testing: R hip and R knee XR- 01/18/20   Pertinent review of systems: No fevers or chills  Relevant historical information: Hypertension and atrial fibrillation   Exam:  BP (!) 150/80 (BP Location: Right Arm, Patient Position: Sitting, Cuff Size: Normal)   Pulse 63   Ht 5\' 7"  (1.702 m)   Wt 186 lb (84.4 kg)   SpO2 96%   BMI 29.13 kg/m  General: Well Developed, well nourished, and in no acute distress.   MSK: L-spine normal-appearing nontender normal motion. Lower extremity strength is intact except for noted below. Reflexes intact and equal bilaterally. Negative slump test bilaterally.  Positive right-straight leg raise test. Normal gait.  Right hip normal-appearing normal motion without pain. Lateral hip nontender. Hip abduction and external rotation strength diminished 4+/5.    Lab and Radiology Results  X-ray images L-spine obtained today personally and independently reviewed DDD diffuse throughout lumbar spine with some facet DJD.  No acute fractures. Await formal radiology review   Assessment and Plan: 64 y.o. female with right leg pain etiology is somewhat unclear.  Patient has multifactorial leg pain including what I think is  lumbar radiculopathy as well as lateral hip pain due to hip abductor tendinopathy and also some knee arthritis pain.  Her lumbar spine x-ray looks worse than her hip and knee did last visit in April.  Plan for trial of physical therapy prednisone and gabapentin.  If not improving patient will notify me we will proceed with lumbar MRI and/or right hip MRI.  Check back in 4 to 6 weeks.   PDMP not reviewed this encounter. Orders Placed This Encounter  Procedures  . DG Lumbar Spine 2-3 Views    Standing Status:   Future    Number of Occurrences:   1    Standing Expiration Date:   04/01/2021    Order Specific Question:   Reason for Exam (SYMPTOM  OR DIAGNOSIS REQUIRED)    Answer:   eval possible right L4 radiculopathy    Order Specific Question:   Preferred imaging location?    Answer:   [facility]    Order Specific Question:   Radiology Contrast Protocol - do NOT remove file path    Answer:   [email or web address]  . Ambulatory referral to Physical Therapy    Referral Priority:   Routine    Referral Type:   Physical Medicine    Referral Reason:   Specialty Services Required    Requested Specialty:   Physical Therapy   Meds ordered this encounter  Medications  . predniSONE (DELTASONE) 50 MG tablet    Sig: Take 1 tablet (50 mg total)  by mouth daily.    Dispense:  5 tablet    Refill:  0  . gabapentin (NEURONTIN) 300 MG capsule    Sig: Take 1 capsule (300 mg total) by mouth 3 (three) times daily as needed (nerve pain).    Dispense:  90 capsule    Refill:  3     Discussed warning signs or symptoms. Please see discharge instructions. Patient expresses understanding.   The above documentation has been reviewed and is accurate and complete Lynne Leader, M.D.

## 2020-04-01 NOTE — Patient Instructions (Signed)
Thank you for coming in today. Get xray today.  Plan for PT.  Take the prednisone for 5 days.  Use the gabapentin  Up to 3x daily for nerve pain.  Let me know if not improving.  For pain going down the leg past the knee would proceed to MRI lumbar spine.  For pain in the side of the hip would proceed to MRI hip.    Radicular Pain Radicular pain is a type of pain that spreads from your back or neck along a spinal nerve. Spinal nerves are nerves that leave the spinal cord and go to the muscles. Radicular pain is sometimes called radiculopathy, radiculitis, or a pinched nerve. When you have this type of pain, you may also have weakness, numbness, or tingling in the area of your body that is supplied by the nerve. The pain may feel sharp and burning. Depending on which spinal nerve is affected, the pain may occur in the:  Neck area (cervical radicular pain). You may also feel pain, numbness, weakness, or tingling in the arms.  Mid-spine area (thoracic radicular pain). You would feel this pain in the back and chest. This type is rare.  Lower back area (lumbar radicular pain). You would feel this pain as low back pain. You may feel pain, numbness, weakness, or tingling in the buttocks or legs. Sciatica is a type of lumbar radicular pain that shoots down the back of the leg. Radicular pain occurs when one of the spinal nerves becomes irritated or squeezed (compressed). It is often caused by something pushing on a spinal nerve, such as one of the bones of the spine (vertebrae) or one of the round cushions between vertebrae (intervertebral disks). This can result from:  An injury.  Wear and tear or aging of a disk.  The growth of a bone spur that pushes on the nerve. Radicular pain often goes away when you follow instructions from your health care provider for relieving pain at home. Follow these instructions at home: Managing pain      If directed, put ice on the affected area: ? Put ice in  a plastic bag. ? Place a towel between your skin and the bag. ? Leave the ice on for 20 minutes, 2-3 times a day.  If directed, apply heat to the affected area as often as told by your health care provider. Use the heat source that your health care provider recommends, such as a moist heat pack or a heating pad. ? Place a towel between your skin and the heat source. ? Leave the heat on for 20-30 minutes. ? Remove the heat if your skin turns bright red. This is especially important if you are unable to feel pain, heat, or cold. You may have a greater risk of getting burned. Activity   Do not sit or rest in bed for long periods of time.  Try to stay as active as possible. Ask your health care provider what type of exercise or activity is best for you.  Avoid activities that make your pain worse, such as bending and lifting.  Do not lift anything that is heavier than 10 lb (4.5 kg), or the limit that you are told, until your health care provider says that it is safe.  Practice using proper technique when lifting items. Proper lifting technique involves bending your knees and rising up.  Do strength and range-of-motion exercises only as told by your health care provider or physical therapist. General instructions  Take over-the-counter  and prescription medicines only as told by your health care provider.  Pay attention to any changes in your symptoms.  Keep all follow-up visits as told by your health care provider. This is important. ? Your health care provider may send you to a physical therapist to help with this pain. Contact a health care provider if:  Your pain and other symptoms get worse.  Your pain medicine is not helping.  Your pain has not improved after a few weeks of home care.  You have a fever. Get help right away if:  You have severe pain, weakness, or numbness.  You have difficulty with bladder or bowel control. Summary  Radicular pain is a type of pain that  spreads from your back or neck along a spinal nerve.  When you have radicular pain, you may also have weakness, numbness, or tingling in the area of your body that is supplied by the nerve.  The pain may feel sharp or burning.  Radicular pain may be treated with ice, heat, medicines, or physical therapy. This information is not intended to replace advice given to you by your health care provider. Make sure you discuss any questions you have with your health care provider. Document Revised: 04/11/2018 Document Reviewed: 04/11/2018 Elsevier Patient Education  [place].

## 2020-04-02 NOTE — Progress Notes (Signed)
X-ray lumbar spine shows narrowed degenerative disks at L1 and L2 and L2-3 which could potentially cause pinched nerve causing hip pain or groin pain or even leg pain.

## 2020-04-03 ENCOUNTER — Other Ambulatory Visit: Payer: Self-pay | Admitting: Nurse Practitioner

## 2020-04-03 MED ORDER — DILTIAZEM HCL ER COATED BEADS 180 MG PO CP24: ORAL_CAPSULE | ORAL | 0 refills | Status: DC

## 2020-04-08 ENCOUNTER — Encounter: Payer: Self-pay | Admitting: Physical Therapy

## 2020-04-08 ENCOUNTER — Other Ambulatory Visit: Payer: Self-pay | Admitting: *Deleted

## 2020-04-08 ENCOUNTER — Other Ambulatory Visit: Payer: Self-pay

## 2020-04-08 ENCOUNTER — Ambulatory Visit: Payer: Medicare Other | Attending: Family Medicine | Admitting: Physical Therapy

## 2020-04-08 DIAGNOSIS — G8929 Other chronic pain: Secondary | ICD-10-CM | POA: Insufficient documentation

## 2020-04-08 DIAGNOSIS — M5441 Lumbago with sciatica, right side: Secondary | ICD-10-CM | POA: Insufficient documentation

## 2020-04-08 MED ORDER — RIVAROXABAN 20 MG PO TABS: ORAL_TABLET | ORAL | 0 refills | Status: DC

## 2020-04-08 NOTE — Telephone Encounter (Addendum)
Xarelto 20mg  refill request received. Pt is 64 years old, weight-84.4kg, Crea-0.70 on 01/10/2019-NEEDS UPDATED LABS, last seen by Dr. Caryl Comes on 02/20/2019-NEEDS AN APPT, Diagnosis-Afib, CrCl-106.64ml/min; Dose is appropriate based on dosing criteria but pt needs labs and an appt with Cardiologist.  Spoke with pt and she is aware that she ned an appt and labs, transferred to main line to schedule an appt. She is aware that I will send in a refill once appt is set.  Pt was able to get an appt with Renee on 04/29/2020 at 1145am. Will send in Xarelto refill.

## 2020-04-08 NOTE — Therapy (Signed)
[facility] [place] , [place], [address] Phone: [phone]   Fax:  [phone]  Physical Therapy Evaluation  Patient Details  Name: Stephanie [name] MRN: [id number] Date of Birth: [birth year] Referring Provider (PT): Lynne Leader MD   Encounter Date: 04/08/2020   PT End of Session - 04/08/20 1601    Visit Number 1    Number of Visits 8    Date for PT Re-Evaluation 05/06/20    PT Start Time 0315    PT Stop Time 0358    PT Time Calculation (min) 43 min    Activity Tolerance Patient tolerated treatment well    Behavior During Therapy [facility] for tasks assessed/performed           Past Medical History:  Diagnosis Date  . Diverticulitis 06/2016  . Hyperlipidemia   . Hypertension   . Paroxysmal atrial fibrillation ([place]) 01/26/2016    History reviewed. No pertinent surgical history.  There were no vitals filed for this visit.    Subjective Assessment - 04/08/20 1611    Subjective COVID-19 screen performed prior to patient entering clinic.  The patient presents to the clinic with at least a 6 month+ h/o right LE pain.  It was thought she might have bursitis but an injection was not very helpful.  X-rays reveal moderate multi-level lumbar DDD.  She experiences pain into her right hip and also reports pain to her right knee and into her anterior leg region.  Prolonged sitting can increase her pain to a 7+/10.  Her pain-level is a 6/10 today.    Pertinent History HTN, paroxysmal atrial fib.    How long can you sit comfortably? 30 minutes.    Diagnostic tests X-ray.    Patient Stated Goals Get out of pain.    Currently in Pain? Yes    Pain Score 6     Pain Location Back    Pain Orientation Right    Pain Descriptors / Indicators Aching    Pain Type Chronic pain    Pain Onset More than a month ago    Pain Frequency Constant    Aggravating Factors  Prolonged sitting.    Pain Relieving Factors Rest.              [facility] PT  Assessment - 04/08/20 0001      Assessment   Medical Diagnosis Lumbar radiculopathy.    Referring Provider (PT) Lynne Leader MD    Onset Date/Surgical Date --   6 months+.     Precautions   Precautions None      Restrictions   Weight Bearing Restrictions No      Balance Screen   Has the patient fallen in the past 6 months No    Has the patient had a decrease in activity level because of a fear of falling?  No    Is the patient reluctant to leave their home because of a fear of falling?  No      Home Ec[employer] residence      Prior Function   Level of Independence Independent      Posture/Postural Control   Posture/Postural Control No significant limitations      Deep Tendon Reflexes   DTR Assessment Site Patella;Achilles    Patella DTR 2+    Achilles DTR --   LT 2+/4+; RT 1+/4+.     ROM / Strength   AROM / PROM / Strength AROM;Strength  AROM   Overall AROM Comments Normal active lumbar range of motion.      Strength   Overall Strength Comments Normal LE strength.      Palpation   Palpation comment No palpable right hip pain.  No significant right low back pain palpable.      Special Tests   Other special tests Right LE slightly shorter than left.  Mild pain reproduction with a right SLR and FABER test.      Ambulation/Gait   Gait Comments WNL.                      Objective measurements completed on examination: See above findings.       [facility] Adult PT Treatment/Exercise - 04/08/20 0001      Modalities   Modalities Electrical Stimulation;Moist Heat      Moist Heat Therapy   Number Minutes Moist Heat 15 Minutes    Moist Heat Location Lumbar Spine      Electrical Stimulation   Electrical Stimulation Location Right low back.    Electrical Stimulation Action Pre-mod.    Electrical Stimulation Parameters 80-150 Hz x 15 minutes.    Electrical Stimulation Goals Pain                       PT  Long Term Goals - 04/08/20 1734      PT LONG TERM GOAL #1   Title Independent with an HEP.    Time 4    Period Weeks    Status New      PT LONG TERM GOAL #2   Title Sit 30 minutes with pain not > 3/10.    Time 4    Period Weeks    Status New      PT LONG TERM GOAL #3   Title Eliminate right LE symptoms.    Time 4    Period Weeks    Status New                  Plan - 04/08/20 1728    Clinical Impression Statement The patient presents to OPPT with c/o radiation of pain into her right hip and as far as her anterior lower leg especially after prolonged sitting.  Her spinal range of motion and LE strength is normal.  Right Achilles DTR is less brisk than left.  She had only a mild increase in pain with a right SLR and FABER test.  Prolonged sitting increase her right LE pain.  Patient will benefit from skilled physical therapy intervention to address deficits and pain.    Personal Factors and Comorbidities Comorbidity 1;Comorbidity 2    Comorbidities HTN, paroxysmal atrial fib.    Examination-Activity Limitations Sit    Examination-Participation Restrictions Other    Stability/Clinical Decision Making Evolving/Moderate complexity    Clinical Decision Making Low    Rehab Potential Good    PT Frequency 2x / week    PT Duration 4 weeks    PT Treatment/Interventions ADLs/Self Care Home Management;Electrical Stimulation;Traction;Moist Heat;Ultrasound;Iontophoresis 4mg /ml Dexamethasone;Therapeutic activities;Therapeutic exercise;Manual techniques;Patient/family education;Passive range of motion;Dry needling;Spinal Manipulations    PT Next Visit Plan May try intermittment traction at 35% body weight, S and DKTC, hip bridges, prone leg lifts.  Modalities as needed.    Consulted and Agree with Plan of Care Patient           Patient will benefit from skilled therapeutic intervention in order to improve the following deficits and impairments:  Pain, Decreased activity  tolerance  Visit Diagnosis: Chronic right-sided low back pain with right-sided sciatica - Plan: PT plan of care cert/re-cert     Problem List Patient Active Problem List   Diagnosis Date Noted  . Anxiety 12/19/2018  . Paroxysmal atrial fibrillation ([place]) 01/26/2016  . Hyperlipidemia 09/01/2009  . TOBACCO ABUSE 09/01/2009  . Essential hypertension 09/01/2009    Earlee Herald, [place] MPT 04/08/2020, 5:37 PM  [facility] [address] [place], [place], [address] Phone: [phone]   Fax:  [phone]  Name: Stephanie [name] MRN: [id number] Date of Birth: January 12, 1956

## 2020-04-15 ENCOUNTER — Other Ambulatory Visit: Payer: Self-pay

## 2020-04-15 ENCOUNTER — Ambulatory Visit: Payer: Medicare Other | Attending: Family Medicine | Admitting: Physical Therapy

## 2020-04-15 ENCOUNTER — Encounter: Payer: Self-pay | Admitting: Physical Therapy

## 2020-04-15 DIAGNOSIS — M5441 Lumbago with sciatica, right side: Secondary | ICD-10-CM | POA: Diagnosis not present

## 2020-04-15 DIAGNOSIS — G8929 Other chronic pain: Secondary | ICD-10-CM | POA: Insufficient documentation

## 2020-04-15 NOTE — Therapy (Addendum)
[facility] [place], [place], [address] Phone: [phone]   Fax:  [phone]  Physical Therapy Treatment  Patient Details  Name: Stephanie [name] MRN: [id number] Date of Birth: 01/10/1956 Referring Provider (PT): Lynne Leader MD   Encounter Date: 04/15/2020   PT End of Session - 04/15/20 1034    Visit Number 2    Number of Visits 8    Date for PT Re-Evaluation 05/06/20    PT Start Time 1036    PT Stop Time 1125    PT Time Calculation (min) 49 min    Activity Tolerance Patient tolerated treatment well    Behavior During Therapy [facility] for tasks assessed/performed           Past Medical History:  Diagnosis Date  . Diverticulitis 06/2016  . Hyperlipidemia   . Hypertension   . Paroxysmal atrial fibrillation ([place]) 01/26/2016    History reviewed. No pertinent surgical history.  There were no vitals filed for this visit.   Subjective Assessment - 04/15/20 1033    Subjective COVID 19 screening performed on patient upon arrival. Patient reports she felt great after last treatment with stim and heat. Patient reports continued radicular LE pain. Worst LE pain is R anteriolateral thigh.    Pertinent History HTN, paroxysmal atrial fib.    How long can you sit comfortably? 30 minutes.    Diagnostic tests X-ray.    Patient Stated Goals Get out of pain.    Currently in Pain? Yes    Pain Score 5     Pain Location Leg    Pain Orientation Right    Pain Descriptors / Indicators Discomfort    Pain Type Chronic pain    Pain Onset More than a month ago    Pain Frequency Intermittent    Aggravating Factors  Walking              [facility] PT Assessment - 04/15/20 0001      Assessment   Medical Diagnosis Lumbar radiculopathy.    Referring Provider (PT) Lynne Leader MD    Next MD Visit TBD      Precautions   Precautions None      Restrictions   Weight Bearing Restrictions No                         [facility] Adult  PT Treatment/Exercise - 04/15/20 0001      Exercises   Exercises Lumbar      Lumbar Exercises: Stretches   Single Knee to Chest Stretch Right;3 reps;30 seconds    Figure 4 Stretch 3 reps;30 seconds;Supine;With overpressure      Lumbar Exercises: Supine   Ab Set 10 reps;5 seconds    Glut Set 10 reps;5 seconds    Bent Knee Raise 10 reps;2 seconds    Bridge 10 reps;3 seconds      Lumbar Exercises: Prone   Straight Leg Raise 15 reps;1 second    Other Prone Lumbar Exercises POE x1 min      Modalities   Modalities Electrical Stimulation;Moist Heat;Traction      Moist Heat Therapy   Number Minutes Moist Heat 10 Minutes    Moist Heat Location Lumbar Spine      Electrical Stimulation   Electrical Stimulation Location R low back    Electrical Stimulation Action Pre-Mod    Electrical Stimulation Parameters 80-150 hz x10 min    Electrical Stimulation Goals Pain  Traction   Type of Traction Lumbar    Min (lbs) 5    Max (lbs) 74    Hold Time 99    Rest Time 5    Time 15                       PT Long Term Goals - 04/08/20 1734      PT LONG TERM GOAL #1   Title Independent with an HEP.    Time 4    Period Weeks    Status New      PT LONG TERM GOAL #2   Title Sit 30 minutes with pain not > 3/10.    Time 4    Period Weeks    Status New      PT LONG TERM GOAL #3   Title Eliminate right LE symptoms.    Time 4    Period Weeks    Status New                 Plan - 04/15/20 1130    Clinical Impression Statement Patient presented in clinic with no LBP but continues to experience LE radicular pain down RLE. Patient states that she felt great after electrical stimulation and moist heat during evaluation. Patient guided through light stretching and core activation exercises with reports of LBP with prone SLR and POE. Muscle fatigue also reported with therex as well. Mechanical lumbar traction intiated at 74# max and patient educated that soreness may  present. Education provided throughout treatment regarding rationale for therex as well as traction. Normal modalities response noted following removal of the modalities.    Personal Factors and Comorbidities Comorbidity 1;Comorbidity 2    Comorbidities HTN, paroxysmal atrial fib.    Examination-Activity Limitations Sit    Examination-Participation Restrictions Other    Stability/Clinical Decision Making Evolving/Moderate complexity    Rehab Potential Good    PT Frequency 2x / week    PT Duration 4 weeks    PT Treatment/Interventions ADLs/Self Care Home Management;Electrical Stimulation;Traction;Moist Heat;Ultrasound;Iontophoresis 4mg /ml Dexamethasone;Therapeutic activities;Therapeutic exercise;Manual techniques;Patient/family education;Passive range of motion;Dry needling;Spinal Manipulations    PT Next Visit Plan Assess traction response.    Consulted and Agree with Plan of Care Patient           Patient will benefit from skilled therapeutic intervention in order to improve the following deficits and impairments:  Pain, Decreased activity tolerance  Visit Diagnosis: Chronic right-sided low back pain with right-sided sciatica     Problem List Patient Active Problem List   Diagnosis Date Noted  . Anxiety 12/19/2018  . Paroxysmal atrial fibrillation ([place]) 01/26/2016  . Hyperlipidemia 09/01/2009  . TOBACCO ABUSE 09/01/2009  . Essential hypertension 09/01/2009   Felt good after first treatment then moved some furniture.  Standley Brooking, PTA 04/15/2020, 11:34 AM   [facility] [address] [place], [place], [address] Phone: [phone]   Fax:  [phone]  Name: Stephanie [name] MRN: [id number] Date of Birth: 09-06-1956

## 2020-04-18 ENCOUNTER — Other Ambulatory Visit: Payer: Self-pay | Admitting: Family Medicine

## 2020-04-23 ENCOUNTER — Ambulatory Visit: Payer: Medicare Other | Admitting: Physical Therapy

## 2020-04-26 ENCOUNTER — Other Ambulatory Visit: Payer: Self-pay | Admitting: Internal Medicine
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
## 2020-04-29 ENCOUNTER — Ambulatory Visit: Payer: Medicare Other | Admitting: Physician Assistant

## 2020-04-29 ENCOUNTER — Other Ambulatory Visit: Payer: Self-pay

## 2020-04-29 VITALS — BP 130/68 | HR 56 | Ht 67.0 in | Wt 184.0 lb

## 2020-04-29 DIAGNOSIS — R55 Syncope and collapse: Secondary | ICD-10-CM | POA: Diagnosis not present

## 2020-04-29 DIAGNOSIS — I480 Paroxysmal atrial fibrillation: Secondary | ICD-10-CM | POA: Diagnosis not present

## 2020-04-29 DIAGNOSIS — E785 Hyperlipidemia, unspecified: Secondary | ICD-10-CM

## 2020-04-29 DIAGNOSIS — I10 Essential (primary) hypertension: Secondary | ICD-10-CM

## 2020-04-29 LAB — BASIC METABOLIC PANEL
BUN/Creatinine Ratio: 18 (ref 12–28)
BUN: 12 mg/dL (ref 8–27)
CO2: 22 mmol/L (ref 20–29)
Calcium: 9.9 mg/dL (ref 8.7–10.3)
Chloride: 97 mmol/L (ref 96–106)
Creatinine, Ser: 0.67 mg/dL (ref 0.57–1.00)
GFR calc Af Amer: 107 mL/min/{1.73_m2} (ref >59)
GFR calc non Af Amer: 93 mL/min/{1.73_m2} (ref >59)
Glucose: 89 mg/dL (ref 65–99)
Potassium: 4.9 mmol/L (ref 3.5–5.2)
Sodium: 132 mmol/L — ABNORMAL LOW (ref 134–144)

## 2020-04-29 LAB — CBC
Hematocrit: 40.1 % (ref 34.0–46.6)
Hemoglobin: 14 g/dL (ref 11.1–15.9)
MCH: 31 pg (ref 26.6–33.0)
MCHC: 34.9 g/dL (ref 31.5–35.7)
MCV: 89 fL (ref 79–97)
Platelets: 334 10*3/uL (ref 150–450)
RBC: 4.52 x10E6/uL (ref 3.77–5.28)
RDW: 12 % (ref 11.7–15.4)
WBC: 7.1 10*3/uL (ref 3.4–10.8)

## 2020-04-29 MED ORDER — METOPROLOL SUCCINATE ER 50 MG PO TB24: ORAL_TABLET | ORAL | 9 refills | Status: DC

## 2020-04-29 MED ORDER — RIVAROXABAN 20 MG PO TABS: ORAL_TABLET | ORAL | 2 refills | Status: DC

## 2020-04-29 MED ORDER — DILTIAZEM HCL ER COATED BEADS 180 MG PO CP24: ORAL_CAPSULE | ORAL | 2 refills | Status: DC

## 2020-04-29 NOTE — Patient Instructions (Addendum)
Medication Instructions:   Your physician recommends that you continue on your current medications as directed. Please refer to the Current Medication list given to you today.   *If you need a refill on your cardiac medications before your next appointment, please call your pharmacy*    LABS TODAY : BMET AND CBC TODAY   If you have labs (blood work) drawn today and your tests are completely normal, you will receive your results only by: Marland Kitchen MyChart Message (if you have MyChart) OR . A paper copy in the mail If you have any lab test that is abnormal or we need to change your treatment, we will call you to review the results.   Testing/Procedures: NONE ORDERED  TODAY    Follow-Up: At [facility], you and your health needs are our priority.  As part of our continuing mission to provide you with exceptional heart care, we have created designated Provider Care Teams.  These Care Teams include your primary Cardiologist (physician) and Advanced Practice Providers (APPs -  Physician Assistants and Nurse Practitioners) who all work together to provide you with the care you need, when you need it.  We recommend signing up for the patient portal called "MyChart".  Sign up information is provided on this After Visit Summary.  MyChart is used to connect with patients for Virtual Visits (Telemedicine).  Patients are able to view lab/test results, encounter notes, upcoming appointments, etc.  Non-urgent messages can be sent to your provider as well.   To learn more about what you can do with MyChart, go to [email or web address].    Your next appointment:   2 month(s)  The format for your next appointment:   In Person  Provider:   You may see Dr. Caryl Comes  or one of the following Advanced Practice Providers on your designated Care Team:    Chanetta Marshall, NP  Tommye Standard, PA-C  Legrand Como "Oda Kilts, [place]    Other Instructions

## 2020-04-30 ENCOUNTER — Encounter: Payer: Self-pay | Admitting: Physical Therapy

## 2020-04-30 ENCOUNTER — Ambulatory Visit: Payer: Medicare Other | Admitting: Physical Therapy

## 2020-04-30 ENCOUNTER — Other Ambulatory Visit: Payer: Self-pay

## 2020-04-30 DIAGNOSIS — M5441 Lumbago with sciatica, right side: Secondary | ICD-10-CM | POA: Diagnosis not present

## 2020-04-30 DIAGNOSIS — G8929 Other chronic pain: Secondary | ICD-10-CM | POA: Diagnosis not present

## 2020-04-30 NOTE — Therapy (Signed)
[facility] [place], [place], [address] Phone: [phone]   Fax:  [phone]  Physical Therapy Treatment  Patient Details  Name: Stephanie [name] MRN: [id number] Date of Birth: 01/18/1956 Referring Provider (PT): Lynne Leader MD   Encounter Date: 04/30/2020   PT End of Session - 04/30/20 1037    Visit Number 3    Number of Visits 8    Date for PT Re-Evaluation 05/06/20    PT Start Time 1032    PT Stop Time 1119    PT Time Calculation (min) 47 min    Activity Tolerance Patient tolerated treatment well    Behavior During Therapy [facility] for tasks assessed/performed           Past Medical History:  Diagnosis Date  . Diverticulitis 06/2016  . Hyperlipidemia   . Hypertension   . Paroxysmal atrial fibrillation ([place]) 01/26/2016    History reviewed. No pertinent surgical history.  There were no vitals filed for this visit.   Subjective Assessment - 04/30/20 1034    Subjective COVID 19 screening performed on patient upon arrival. Patient reports that she had some discomfort after last traction session. Patient likes stimulation and heat and willing to try traction again. Still getting radicular pain down RLE at same intensity.    Pertinent History HTN, paroxysmal atrial fib.    How long can you sit comfortably? 30 minutes.    Diagnostic tests X-ray.    Patient Stated Goals Get out of pain.    Currently in Pain? Yes    Pain Score 1     Pain Location Back    Pain Orientation Right    Pain Descriptors / Indicators Discomfort    Pain Type Chronic pain    Pain Radiating Towards R thigh, hip    Pain Onset More than a month ago    Pain Frequency Intermittent              [facility] PT Assessment - 04/30/20 0001      Assessment   Medical Diagnosis Lumbar radiculopathy.    Referring Provider (PT) Lynne Leader MD    Next MD Visit TBD      Precautions   Precautions None      Restrictions   Weight Bearing Restrictions No                          [facility] Adult PT Treatment/Exercise - 04/30/20 0001      Lumbar Exercises: Stretches   Single Knee to Chest Stretch Right;3 reps;30 seconds    Figure 4 Stretch 3 reps;30 seconds;Supine;With overpressure      Lumbar Exercises: Supine   Ab Set 10 reps;5 seconds    Bridge 15 reps;3 seconds      Lumbar Exercises: Prone   Other Prone Lumbar Exercises Prone pressups x15 reps    Other Prone Lumbar Exercises POE x1 min      Modalities   Modalities Electrical Stimulation;Moist Heat;Traction      Moist Heat Therapy   Number Minutes Moist Heat 10 Minutes    Moist Heat Location Lumbar Spine      Electrical Stimulation   Electrical Stimulation Location B low back    Electrical Stimulation Action Pre-Mod    Electrical Stimulation Parameters 80-150 hz x10 min    Electrical Stimulation Goals Pain      Traction   Type of Traction Lumbar    Min (lbs) 5  Max (lbs) 74    Hold Time 99    Rest Time 5    Time 15                       PT Long Term Goals - 04/30/20 1110      PT LONG TERM GOAL #1   Title Independent with an HEP.    Time 4    Period Weeks    Status On-going      PT LONG TERM GOAL #2   Title Sit 30 minutes with pain not > 3/10.    Time 4    Period Weeks    Status On-going      PT LONG TERM GOAL #3   Title Eliminate right LE symptoms.    Time 4    Period Weeks    Status On-going                 Plan - 04/30/20 1111    Clinical Impression Statement Patient presented in clinic with continued LBP and RLE radicular pain. Patient progressed through stretching of low back and hip along with core strengthening. Patient did report some discomfort with bridging in low back. Mechanical lumbar traction continued to assess response again to determine if traction should be terminated. Normal stimulation and moist heat response noted following end of session. No complaints following end of traction session but patient  instructed to continue assessing LBP and radicular pain until next visit.    Personal Factors and Comorbidities Comorbidity 1;Comorbidity 2    Comorbidities HTN, paroxysmal atrial fib.    Examination-Activity Limitations Sit    Examination-Participation Restrictions Other    Stability/Clinical Decision Making Evolving/Moderate complexity    Rehab Potential Good    PT Frequency 2x / week    PT Duration 4 weeks    PT Treatment/Interventions ADLs/Self Care Home Management;Electrical Stimulation;Traction;Moist Heat;Ultrasound;Iontophoresis 4mg /ml Dexamethasone;Therapeutic activities;Therapeutic exercise;Manual techniques;Patient/family education;Passive range of motion;Dry needling;Spinal Manipulations    PT Next Visit Plan Assess traction response.    Consulted and Agree with Plan of Care Patient           Patient will benefit from skilled therapeutic intervention in order to improve the following deficits and impairments:  Pain, Decreased activity tolerance  Visit Diagnosis: Chronic right-sided low back pain with right-sided sciatica     Problem List Patient Active Problem List   Diagnosis Date Noted  . Anxiety 12/19/2018  . Paroxysmal atrial fibrillation ([place]) 01/26/2016  . Hyperlipidemia 09/01/2009  . TOBACCO ABUSE 09/01/2009  . Essential hypertension 09/01/2009    Standley Brooking, PTA 04/30/2020, 11:24 AM  [facility] [address] [place], [place], [address] Phone: [phone]   Fax:  [phone]  Name: Stephanie [name] MRN: [id number] Date of Birth: December 29, 1955

## 2020-05-07 ENCOUNTER — Ambulatory Visit: Payer: Medicare Other | Admitting: Physical Therapy

## 2020-05-14 ENCOUNTER — Encounter: Payer: Self-pay | Admitting: Physical Therapy

## 2020-05-14 ENCOUNTER — Ambulatory Visit: Payer: Medicare Other | Attending: Family Medicine | Admitting: Physical Therapy

## 2020-05-14 ENCOUNTER — Other Ambulatory Visit: Payer: Self-pay

## 2020-05-14 DIAGNOSIS — G8929 Other chronic pain: Secondary | ICD-10-CM | POA: Diagnosis not present

## 2020-05-14 DIAGNOSIS — M5441 Lumbago with sciatica, right side: Secondary | ICD-10-CM | POA: Diagnosis not present

## 2020-05-14 NOTE — Therapy (Signed)
[facility] [place], [place], [address] Phone: [phone]   Fax:  [phone]  Physical Therapy Treatment  Patient Details  Name: Stephanie [name] MRN: [id number] Date of Birth: 10/19/1955 Referring Provider (PT): Lynne Leader MD   Encounter Date: 05/14/2020   PT End of Session - 05/14/20 1038    Visit Number 4    Number of Visits 8    Date for PT Re-Evaluation 05/06/20    PT Start Time 1038    PT Stop Time 1120    PT Time Calculation (min) 42 min    Activity Tolerance Patient tolerated treatment well    Behavior During Therapy [facility] for tasks assessed/performed           Past Medical History:  Diagnosis Date  . Diverticulitis 06/2016  . Hyperlipidemia   . Hypertension   . Paroxysmal atrial fibrillation ([place]) 01/26/2016    History reviewed. No pertinent surgical history.  There were no vitals filed for this visit.   Subjective Assessment - 05/14/20 1037    Subjective COVID 19 screening performed on patient upon arrrival. Patient reports no improvment with traction but has had more pain since riding to [facility] with her daughte.r    Pertinent History HTN, paroxysmal atrial fib.    How long can you sit comfortably? 30 minutes.    Diagnostic tests X-ray.    Patient Stated Goals Get out of pain.    Currently in Pain? Yes    Pain Score 5     Pain Location Back    Pain Orientation Right;Lower    Pain Descriptors / Indicators Discomfort    Pain Onset More than a month ago    Pain Frequency Intermittent              [facility] PT Assessment - 05/14/20 0001      Assessment   Medical Diagnosis Lumbar radiculopathy.    Referring Provider (PT) Lynne Leader MD    Next MD Visit TBD      Precautions   Precautions None      Restrictions   Weight Bearing Restrictions No                         [facility] Adult PT Treatment/Exercise - 05/14/20 0001      Modalities   Modalities Electrical  Stimulation;Ultrasound      Electrical Stimulation   Electrical Stimulation Location R low back    Electrical Stimulation Action Pre-Mod    Electrical Stimulation Parameters 80-150 hz x15 min    Electrical Stimulation Goals Pain      Ultrasound   Ultrasound Location R SI joint/ low back    Ultrasound Parameters Combo 1.5 w/cm2, 100%, 1 mhz x10 min    Ultrasound Goals Pain      Manual Therapy   Manual Therapy Soft tissue mobilization    Soft tissue mobilization STW to R lumbar paraspinals, QL, SI joint to reduce tenderness and pain                       PT Long Term Goals - 04/30/20 1110      PT LONG TERM GOAL #1   Title Independent with an HEP.    Time 4    Period Weeks    Status On-going      PT LONG TERM GOAL #2   Title Sit 30 minutes with pain not > 3/10.    Time  4    Period Weeks    Status On-going      PT LONG TERM GOAL #3   Title Eliminate right LE symptoms.    Time 4    Period Weeks    Status On-going                 Plan - 05/14/20 1120    Clinical Impression Statement Patient presented in clinic with continued R LBP and RLE weakness with WBing if she has sat for prolonged period. Patient tender to palpation over R SI joint and some muscle tightness palpable in R QL and lumbar paraspinals. Normal modalities response noted following removal of the modalities.    Personal Factors and Comorbidities Comorbidity 1;Comorbidity 2    Comorbidities HTN, paroxysmal atrial fib.    Examination-Activity Limitations Sit    Examination-Participation Restrictions Other    Stability/Clinical Decision Making Evolving/Moderate complexity    Rehab Potential Good    PT Frequency 2x / week    PT Duration 4 weeks    PT Treatment/Interventions ADLs/Self Care Home Management;Electrical Stimulation;Traction;Moist Heat;Ultrasound;Iontophoresis 4mg /ml Dexamethasone;Therapeutic activities;Therapeutic exercise;Manual techniques;Patient/family education;Passive range  of motion;Dry needling;Spinal Manipulations    PT Next Visit Plan Assess lumbar/ SI joint symptoms.    Consulted and Agree with Plan of Care Patient           Patient will benefit from skilled therapeutic intervention in order to improve the following deficits and impairments:  Pain, Decreased activity tolerance  Visit Diagnosis: Chronic right-sided low back pain with right-sided sciatica     Problem List Patient Active Problem List   Diagnosis Date Noted  . Anxiety 12/19/2018  . Paroxysmal atrial fibrillation ([place]) 01/26/2016  . Hyperlipidemia 09/01/2009  . TOBACCO ABUSE 09/01/2009  . Essential hypertension 09/01/2009    Standley Brooking, PTA 05/14/2020, 11:23 AM  [facility] [address] [place], [place], [address] Phone: [phone]   Fax:  [phone]  Name: Stephanie [name] MRN: [id number] Date of Birth: January 28, 1956

## 2020-05-21 ENCOUNTER — Ambulatory Visit: Payer: Medicare Other | Admitting: Physical Therapy

## 2020-05-21 ENCOUNTER — Encounter: Payer: Self-pay | Admitting: Physical Therapy

## 2020-05-21 ENCOUNTER — Other Ambulatory Visit: Payer: Self-pay

## 2020-05-21 DIAGNOSIS — G8929 Other chronic pain: Secondary | ICD-10-CM

## 2020-05-21 DIAGNOSIS — M5441 Lumbago with sciatica, right side: Secondary | ICD-10-CM | POA: Diagnosis not present

## 2020-05-21 NOTE — Therapy (Signed)
[facility] [place], [place], [address] Phone: [phone]   Fax:  [phone]  Physical Therapy Treatment  Patient Details  Name: Stephanie [name] MRN: [id number] Date of Birth: 1956-01-15 Referring Provider (PT): Lynne Leader MD   Encounter Date: 05/21/2020   PT End of Session - 05/21/20 1116    Visit Number 5    Number of Visits 8    Date for PT Re-Evaluation 05/06/20    PT Start Time 1121    PT Stop Time 1202    PT Time Calculation (min) 41 min    Activity Tolerance Patient tolerated treatment well    Behavior During Therapy [facility] for tasks assessed/performed           Past Medical History:  Diagnosis Date  . Diverticulitis 06/2016  . Hyperlipidemia   . Hypertension   . Paroxysmal atrial fibrillation ([place]) 01/26/2016    History reviewed. No pertinent surgical history.  There were no vitals filed for this visit.   Subjective Assessment - 05/21/20 1120    Subjective COVID 19 screening performed on patient upon arrrival. Patient reports no improvement with PT and still having most pain glute region. Only temporary relief with stimulation.    Pertinent History HTN, paroxysmal atrial fib.    How long can you sit comfortably? 30 minutes.    Diagnostic tests X-ray.    Patient Stated Goals Get out of pain.    Currently in Pain? Yes    Pain Score 7     Pain Location Back    Pain Orientation Right;Lower    Pain Descriptors / Indicators Discomfort    Pain Type Chronic pain    Pain Radiating Towards R thigh    Pain Onset More than a month ago    Pain Frequency Intermittent              [facility] PT Assessment - 05/21/20 0001      Assessment   Medical Diagnosis Lumbar radiculopathy.    Referring Provider (PT) Lynne Leader MD    Next MD Visit TBD      Precautions   Precautions None      Restrictions   Weight Bearing Restrictions No                         [facility] Adult PT Treatment/Exercise -  05/21/20 0001      Modalities   Modalities Electrical Stimulation;Iontophoresis      Acupuncturist Location R superior glute    Electrical Stimulation Action Pre-mod    Electrical Stimulation Parameters 80-150 hz x15 min    Electrical Stimulation Goals Pain      Iontophoresis   Type of Iontophoresis Dexamethasone    Location R posterior hip    Dose 1.0 ml    Time 8      Manual Therapy   Manual Therapy Soft tissue mobilization    Soft tissue mobilization STW to R glute, SI joint to reduce pain                       PT Long Term Goals - 05/21/20 1202      PT LONG TERM GOAL #1   Title Independent with an HEP.    Time 4    Period Weeks    Status Unable to assess      PT LONG TERM GOAL #2   Title Sit 30 minutes  with pain not > 3/10.    Time 4    Period Weeks    Status Not Met      PT LONG TERM GOAL #3   Title Eliminate right LE symptoms.    Time 4    Period Weeks    Status Not Met                 Plan - 05/21/20 1151    Clinical Impression Statement Patient has been unable to see improvement with pain since beginning PT. Patient still experiencing R glute pain and some radicular symptoms down R lateral thigh. Electrical stimulation only providing temporary relief. Min to mod tone palpable in R superior glute but no tenderness upon palpation of R SI joint or greater trochanter. Normal stimulation response noted following removal of the modality. Patient also provided iontophoresis patch to R glute and provided handout along with instruction to remove in 4 hours.    Personal Factors and Comorbidities Comorbidity 1;Comorbidity 2    Comorbidities HTN, paroxysmal atrial fib.    Examination-Activity Limitations Sit    Examination-Participation Restrictions Other    Stability/Clinical Decision Making Evolving/Moderate complexity    Rehab Potential Good    PT Frequency 2x / week    PT Duration 4 weeks    PT  Treatment/Interventions ADLs/Self Care Home Management;Electrical Stimulation;Traction;Moist Heat;Ultrasound;Iontophoresis '4mg'$ /ml Dexamethasone;Therapeutic activities;Therapeutic exercise;Manual techniques;Patient/family education;Passive range of motion;Dry needling;Spinal Manipulations    PT Next Visit Plan Hold until MD consulted.    Consulted and Agree with Plan of Care Patient           Patient will benefit from skilled therapeutic intervention in order to improve the following deficits and impairments:  Pain, Decreased activity tolerance  Visit Diagnosis: Chronic right-sided low back pain with right-sided sciatica     Problem List Patient Active Problem List   Diagnosis Date Noted  . Anxiety 12/19/2018  . Paroxysmal atrial fibrillation ([place]) 01/26/2016  . Hyperlipidemia 09/01/2009  . TOBACCO ABUSE 09/01/2009  . Essential hypertension 09/01/2009    Standley Brooking, PTA 05/21/2020, 12:09 PM  [facility] [address] [place], [place], [address] Phone: [phone]   Fax:  [phone]  Name: Stephanie [name] MRN: [id number] Date of Birth: [birth year]

## 2020-06-02 ENCOUNTER — Encounter: Payer: Self-pay | Admitting: Family Medicine

## 2020-06-02 ENCOUNTER — Other Ambulatory Visit: Payer: Self-pay

## 2020-06-02 ENCOUNTER — Ambulatory Visit: Payer: Medicare Other | Admitting: Family Medicine

## 2020-06-02 VITALS — BP 122/82 | HR 64 | Ht 67.0 in | Wt 188.0 lb

## 2020-06-02 DIAGNOSIS — M25551 Pain in right hip: Secondary | ICD-10-CM

## 2020-06-02 NOTE — Patient Instructions (Addendum)
Thank you for coming in today. Plan for MRI .   Recheck after MRI.  May do injection then if a good target.  Backup plan is lumbar MRI.   Phone number to MRI in [place] is [phone]

## 2020-06-02 NOTE — Progress Notes (Signed)
   Rito Ehrlich, am serving as a Education administrator for Dr. Lynne Leader.  Stephanie [name] is a 64 y.o. female who presents to [place] [facility] today for f/u of R hip/ITB pain.  She was last seen by Dr. Georgina Snell on 04/01/20.  She was referred to PT and has completed 5 visits.  She was prescribed prednisone and Gabapentin.  She's had a prior R GT injection in Nov 2020.  Since her last visit, pt reports no change in her symptoms since beginning PT. Patient states that she almost feels worse because she feels the pain is more constant.   Pain mostly located in posterior lateral buttocks and hip.  Minimal pain extending below the level of the knee to the anterior to medial shin.  Diagnostic testing: L-spine XR- 04/01/20; R knee and R hip XR- 01/18/20   Pertinent review of systems: No fevers or chills  Relevant historical information: A. fib and hypertension.  On Xarelto.   Exam:  BP 122/82 (BP Location: Left Arm, Patient Position: Sitting, Cuff Size: Normal)   Pulse 64   Ht 5\' 7"  (1.702 m)   Wt 188 lb (85.3 kg)   SpO2 96%   BMI 29.44 kg/m  General: Well Developed, well nourished, and in no acute distress.   MSK: L-spine normal-appearing nontender decreased motion of L-spine to extension and flexion. Negative right-sided straight leg raise test. Reflexes sensation are intact distal bilateral extremities. Right hip normal-appearing Not particularly tender to palpation at greater trochanter.  Mildly tender palpation at mid substance duties medius tendon area. Hip abduction and external rotation strength diminished 4/5.    Lab and Radiology Results   X-ray images L-spine June 22nd 2021 showing multilevel DDD. X-ray hip dated January 18 2020 showing only minimal changes. X-ray images personally independently reviewed.   Assessment and Plan: 64 y.o. female with right buttock/hip pain failing typical conservative management with physical therapy and nonguided injection by  PCP.  At this point likely to be musculoskeletal issue directly in the buttocks hip area such as tendinitis or bursitis or potentially even degenerative changes in the hip joint itself.  Could also be lumbar radiculopathy at L4 or possibly both issues.  Plan to further characterize cause of pain and dictate treatment with MRI hip.  If completely normal would then proceed with MRI L-spine to look for L4 radiculopathy.  Recheck after MRI to possibly proceed with injection at that time    Orders Placed This Encounter  Procedures  . MR HIP RIGHT WO CONTRAST    Standing Status:   Future    Standing Expiration Date:   06/02/2021    Order Specific Question:   What is the patient's sedation requirement?    Answer:   Anti-anxiety    Order Specific Question:   Does the patient have a pacemaker or implanted devices?    Answer:   No    Order Specific Question:   Preferred imaging location?    Answer:   Product/process development scientist (table limit-350lbs)    Order Specific Question:   Radiology Contrast Protocol - do NOT remove file path    Answer:   \\[email or web address]   No orders of the defined types were placed in this encounter.    Discussed warning signs or symptoms. Please see discharge instructions. Patient expresses understanding.   The above documentation has been reviewed and is accurate and complete Lynne Leader, M.D.

## 2020-06-04 ENCOUNTER — Encounter: Payer: Self-pay | Admitting: Family Medicine

## 2020-06-05 MED ORDER — FLUOXETINE HCL 20 MG PO CAPS: 20.0000 mg | ORAL_CAPSULE | Freq: Every day | ORAL | 0 refills | Status: DC

## 2020-06-05 MED ORDER — VALSARTAN 80 MG PO TABS: 80.0000 mg | ORAL_TABLET | Freq: Every day | ORAL | 0 refills | Status: DC

## 2020-06-05 MED ORDER — ROSUVASTATIN CALCIUM 40 MG PO TABS: 40.0000 mg | ORAL_TABLET | Freq: Every day | ORAL | 0 refills | Status: DC

## 2020-06-09 ENCOUNTER — Other Ambulatory Visit: Payer: Self-pay

## 2020-06-09 ENCOUNTER — Ambulatory Visit (INDEPENDENT_AMBULATORY_CARE_PROVIDER_SITE_OTHER): Payer: Medicare Other

## 2020-06-09 DIAGNOSIS — M25451 Effusion, right hip: Secondary | ICD-10-CM | POA: Diagnosis not present

## 2020-06-09 DIAGNOSIS — M25551 Pain in right hip: Secondary | ICD-10-CM | POA: Diagnosis not present

## 2020-06-09 DIAGNOSIS — M76891 Other specified enthesopathies of right lower limb, excluding foot: Secondary | ICD-10-CM | POA: Diagnosis not present

## 2020-06-09 DIAGNOSIS — G8929 Other chronic pain: Secondary | ICD-10-CM | POA: Diagnosis not present

## 2020-06-09 DIAGNOSIS — M7061 Trochanteric bursitis, right hip: Secondary | ICD-10-CM | POA: Diagnosis not present

## 2020-06-10 NOTE — Progress Notes (Signed)
MRI hip shows gluteus minimus tendinitis.  This is the most likely cause of your pain.  We do see some bursitis as well which may be contributory.Some arthritis and a possible labrum tear also present in the hip unlikely to be causing your pain.  Would recommend ultrasound-guided injection based on the MRI.  Recommend schedule with me in clinic to review the MRI in detail and also proceed with injection if needed.

## 2020-06-18 ENCOUNTER — Encounter: Payer: Self-pay | Admitting: Family Medicine

## 2020-06-18 ENCOUNTER — Ambulatory Visit: Payer: Medicare Other | Admitting: Family Medicine

## 2020-06-18 ENCOUNTER — Other Ambulatory Visit: Payer: Self-pay

## 2020-06-18 ENCOUNTER — Ambulatory Visit: Payer: Self-pay

## 2020-06-18 VITALS — BP 148/82 | HR 58 | Ht 67.0 in | Wt 187.4 lb

## 2020-06-18 DIAGNOSIS — M25551 Pain in right hip: Secondary | ICD-10-CM

## 2020-06-18 MED ORDER — GABAPENTIN 300 MG PO CAPS
300.0000 mg | ORAL_CAPSULE | Freq: Three times a day (TID) | ORAL | 3 refills | Status: DC | PRN
Start: 2020-06-18 — End: 2020-08-12

## 2020-06-18 NOTE — Patient Instructions (Signed)
Thank you for coming in today.  Call or go to the ER if you develop a large red swollen joint with extreme pain or oozing puss.   I think some of the pain is coming from the hip joint and from the side of the hip and even the back.   Try gabapentin  Up to 3x daily for possible nerve pain.   If the pain gets a lot better after the shot then come back a few days later let me know.   Could do MRI lumbar spine as another next step as well.

## 2020-06-18 NOTE — Progress Notes (Signed)
I, Wendy Poet, LAT, ATC, am serving as scribe for Dr. Lynne Leader.  Stephanie [name] is a 64 y.o. female who presents to [place] [facility] today for f/u of R hip pain and R hip MRI review.  She was last seen by Dr. Georgina Snell on 06/02/20 w/ worsening R post-lat hip/buttock pain.  She has completed 5 PT visits and had a R GT injection in Nov 2020 w/ limited to no relief.  Since her last visit, pt reports that her R hip and thigh pain are worsening since her last visit.  She denies any R hip mechanical symptoms.  Diagnostic testing: R hip MRI- 06/09/20; Lumbar spine XR- 04/01/20; R knee and R hip XR- 01/18/20   Pertinent review of systems: No fevers or chills  Relevant historical information: Hypertension   Exam:  BP (!) 148/82 (BP Location: Right Arm, Patient Position: Sitting, Cuff Size: Normal)   Pulse (!) 58   Ht 5\' 7"  (1.702 m)   Wt 187 lb 6.4 oz (85 kg)   SpO2 99%   BMI 29.35 kg/m  General: Well Developed, well nourished, and in no acute distress.   MSK: Right hip normal-appearing normal motion.  Mildly tender palpation greater trochanter.    Lab and Radiology Results EXAM: MR OF THE RIGHT HIP WITHOUT CONTRAST  TECHNIQUE: Multiplanar, multisequence MR imaging was performed. No intravenous contrast was administered.  COMPARISON:  None.  FINDINGS: Bones: Mild spurring of both acetabula and of the right femoral head.  Articular cartilage and labrum  Articular cartilage: Mild axial loss of articular cartilage thickness bilaterally. No focal chondral lesion is observed.  Labrum: Moderate suspicion for an anterior superior labral tear based on accentuated signal on images 8 through 10 of series 6. This accentuated signal is blurred likely due to motion. No paralabral cyst.  Joint or bursal effusion  Joint effusion:  Absent  Bursae: Mild right trochanteric bursitis.  Muscles and tendons  Muscles and tendons: Mild gluteus minimus  tendinopathy. Hamstring tendons appear normal and symmetric.  Other findings  Miscellaneous:   No supplemental non-categorized findings.  IMPRESSION: 1. Mild right trochanteric bursitis. 2. Mild gluteus minimus tendinopathy. 3. Mild axial loss of articular cartilage thickness in both hips. 4. Moderate suspicion for an anterior superior labral tear on the right. No paralabral cyst.   Electronically Signed   By: Van Clines M.D.   On: 06/10/2020 09:36 I, Lynne Leader, personally (independently) visualized and performed the interpretation of the images attached in this note. Agree with radiology read.   Procedure: Real-time Ultrasound Guided Injection of right femoral acetabular joint hip Device: Philips Affiniti 50G Images permanently stored and available for review in PACS Verbal informed consent obtained.  Discussed risks and benefits of procedure. Warned about infection bleeding damage to structures skin hypopigmentation and fat atrophy among others. Patient expresses understanding and agreement Time-out conducted.   Noted no overlying erythema, induration, or other signs of local infection.   Skin prepped in a sterile fashion.   Local anesthesia: Topical Ethyl chloride.   With sterile technique and under real time ultrasound guidance:  40 mg of Kenalog and 2 L of Marcaine injected easily.   Completed without difficulty   Pain moderately immediately resolved suggesting accurate placement of the medication.   Advised to call if fevers/chills, erythema, induration, drainage, or persistent bleeding.   Images permanently stored and available for review in the ultrasound unit.  Impression: Technically successful ultrasound guided injection.  Assessment and Plan: 64 y.o. female with right hip pain.  Difficulty determining full etiology.  She feels a lot of pain in her buttocks and lateral hip.  MRI of this area did show trochanteric bursitis and hip abductor  tendinopathy.  However on exam she is not very tender in this region is already had nonguided injection and good trial of physical therapy with only limited benefit.  I proceeded with intra-articular diagnostic and therapeutic hip injection in clinic today.  She had pretty good pain relief immediately indicating that a significant portion of her pain and is intra-articular not from the trochanteric bursitis or tendinopathy.  It is also possible that some of her pain is lumbar radicular as well.  Plan for injection as above and trial of gabapentin.  If not better would proceed with an MRI lumbar spine.  If still not better would consider referral to orthopedic surgery for surgical consultation.  Meantime continue home exercise program for hip abduction strengthening.    Orders Placed This Encounter  Procedures  . [place] LIMITED JOINT SPACE STRUCTURES LOW RIGHT(NO LINKED CHARGES)    Order Specific Question:   Reason for Exam (SYMPTOM  OR DIAGNOSIS REQUIRED)    Answer:   eval hip pain    Order Specific Question:   Preferred imaging location?    Answer:   [place]   Meds ordered this encounter  Medications  . gabapentin (NEURONTIN) 300 MG capsule    Sig: Take 1 capsule (300 mg total) by mouth 3 (three) times daily as needed (nerve pain).    Dispense:  90 capsule    Refill:  3     Discussed warning signs or symptoms. Please see discharge instructions. Patient expresses understanding.   The above documentation has been reviewed and is accurate and complete Lynne Leader, M.D. Total encounter time 30 minutes including face-to-face time with the patient and charting on the date of service.   Time excludes time to perform ultrasound-guided injection.

## 2020-06-25 ENCOUNTER — Other Ambulatory Visit: Payer: Self-pay

## 2020-06-25 MED ORDER — RIVAROXABAN 20 MG PO TABS: ORAL_TABLET | ORAL | 3 refills | Status: DC

## 2020-06-26 ENCOUNTER — Encounter: Payer: Self-pay | Admitting: Family Medicine

## 2020-06-26 ENCOUNTER — Telehealth: Payer: Self-pay | Admitting: *Deleted

## 2020-06-26 DIAGNOSIS — M25551 Pain in right hip: Secondary | ICD-10-CM

## 2020-06-26 NOTE — Telephone Encounter (Signed)
Spoke with patient who aware of samples left at front desk and said she pick up by Friday or Monday the latest

## 2020-07-02 ENCOUNTER — Ambulatory Visit: Payer: Medicare Other | Admitting: Physician Assistant

## 2020-07-15 ENCOUNTER — Ambulatory Visit: Payer: Medicare Other | Admitting: Orthopaedic Surgery

## 2020-07-18 ENCOUNTER — Other Ambulatory Visit: Payer: Self-pay | Admitting: Family Medicine

## 2020-07-18 MED ORDER — FLUOXETINE HCL 20 MG PO CAPS: 20.0000 mg | ORAL_CAPSULE | Freq: Every day | ORAL | 0 refills | Status: DC

## 2020-07-21 DIAGNOSIS — Z1231 Encounter for screening mammogram for malignant neoplasm of breast: Secondary | ICD-10-CM | POA: Diagnosis not present

## 2020-07-30 ENCOUNTER — Encounter: Payer: Self-pay | Admitting: Family Medicine

## 2020-08-01 ENCOUNTER — Ambulatory Visit: Payer: Medicare Other | Admitting: Physician Assistant

## 2020-08-06 ENCOUNTER — Other Ambulatory Visit: Payer: Self-pay | Admitting: Family Medicine

## 2020-08-11 ENCOUNTER — Ambulatory Visit: Payer: Medicare Other | Admitting: Family Medicine

## 2020-08-12 ENCOUNTER — Encounter: Payer: Self-pay | Admitting: Family Medicine

## 2020-08-12 ENCOUNTER — Other Ambulatory Visit: Payer: Self-pay

## 2020-08-12 ENCOUNTER — Ambulatory Visit (INDEPENDENT_AMBULATORY_CARE_PROVIDER_SITE_OTHER): Payer: Medicare Other | Admitting: Family Medicine

## 2020-08-12 VITALS — BP 136/74 | HR 60 | Temp 98.1°F | Ht 67.0 in | Wt 188.3 lb

## 2020-08-12 DIAGNOSIS — I10 Essential (primary) hypertension: Secondary | ICD-10-CM | POA: Diagnosis not present

## 2020-08-12 DIAGNOSIS — Z23 Encounter for immunization: Secondary | ICD-10-CM

## 2020-08-12 DIAGNOSIS — I480 Paroxysmal atrial fibrillation: Secondary | ICD-10-CM | POA: Diagnosis not present

## 2020-08-12 DIAGNOSIS — E785 Hyperlipidemia, unspecified: Secondary | ICD-10-CM

## 2020-08-12 MED ORDER — VALSARTAN 80 MG PO TABS
80.0000 mg | ORAL_TABLET | Freq: Every day | ORAL | 3 refills | Status: DC
Start: 2020-08-12 — End: 2020-12-24

## 2020-08-12 MED ORDER — ROSUVASTATIN CALCIUM 40 MG PO TABS
40.0000 mg | ORAL_TABLET | Freq: Every day | ORAL | 3 refills | Status: DC
Start: 2020-08-12 — End: 2020-12-24

## 2020-08-12 NOTE — Addendum Note (Signed)
Addended by: Marrion Coy on: 08/12/2020 10:32 AM   Modules accepted: Orders

## 2020-08-12 NOTE — Progress Notes (Signed)
Established Patient Office Visit  Subjective:  Patient ID: Stephanie [name], female    DOB: July 26, 1956  Age: 64 y.o. MRN: [id number]  CC:  Chief Complaint  Patient presents with  . Follow-up    discuss medication     HPI Stephanie [name] presents for medical follow-up.  She retired last year.  She is enjoying retirement.  She needs follow-up refills for medications including valsartan hand and Crestor.  She has history of paroxysmal atrial fibrillation, hypertension, hyperlipidemia.  Quit smoking about 10 years ago.  She remains on Xarelto for her A. fib.  She had recent CBC and basic metabolic panel that were normal.  She remains on rosuvastatin for her hyperlipidemia and takes valsartan and metoprolol as well as diltiazem for her hypertension.  Denies any recent palpitations or chest pains.  No dizziness.  Currently not walking or exercising regularly.  She has had some recent right hip pains.  She had injection per sports medicine which helped temporarily.  She has pending follow-up with orthopedic surgery.  Past Medical History:  Diagnosis Date  . Diverticulitis 06/2016  . Hyperlipidemia   . Hypertension   . Paroxysmal atrial fibrillation ([place]) 01/26/2016    No past surgical history on file.  Family History  Problem Relation Age of Onset  . Heart disease Father 17       CAD  . Sudden death Other        family hx  . Alzheimer's disease Mother   . Heart disease Brother 81       CAD    Social History   Socioeconomic History  . Marital status: Married    Spouse name: Not on file  . Number of children: 2  . Years of education: Not on file  . Highest education level: Not on file  Occupational History  . Not on file  Tobacco Use  . Smoking status: Former Smoker    Packs/day: 1.00    Years: 30.00    Pack years: 30.00    Types: Cigarettes    Quit date: 05/31/2010    Years since quitting: 10.2  . Smokeless tobacco: Never Used  Vaping Use  . Vaping Use: Never used   Substance and Sexual Activity  . Alcohol use: Yes    Alcohol/week: 2.0 standard drinks    Types: 2 Cans of beer per week    Comment: occ  . Drug use: No  . Sexual activity: Not on file  Other Topics Concern  . Not on file  Social History Narrative  . Not on file   Social Determinants of Health   Financial Resource Strain:   . Difficulty of Paying Living Expenses: Not on file  Food Insecurity:   . Worried About Charity fundraiser in the Last Year: Not on file  . Ran Out of Food in the Last Year: Not on file  Transportation Needs:   . Lack of Transportation (Medical): Not on file  . Lack of Transportation (Non-Medical): Not on file  Physical Activity:   . Days of Exercise per Week: Not on file  . Minutes of Exercise per Session: Not on file  Stress:   . Feeling of Stress : Not on file  Social Connections:   . Frequency of Communication with Friends and Family: Not on file  . Frequency of Social Gatherings with Friends and Family: Not on file  . Attends Religious Services: Not on file  . Active Member of Clubs or Organizations: Not  on file  . Attends Archivist Meetings: Not on file  . Marital Status: Not on file  Intimate Partner Violence:   . Fear of Current or Ex-Partner: Not on file  . Emotionally Abused: Not on file  . Physically Abused: Not on file  . Sexually Abused: Not on file    Outpatient Medications Prior to Visit  Medication Sig Dispense Refill  . ALPRAZolam (XANAX) 0.5 MG tablet Take 1 tablet (0.5 mg total) by mouth 3 (three) times daily as needed for anxiety. 30 tablet 0  . cholecalciferol (VITAMIN D) 1000 UNITS tablet Take 1,000 Units by mouth daily.      Marland Kitchen diltiazem (CARDIZEM CD) 180 MG 24 hr capsule TAKE 1 CAPSULE BY MOUTH EVERY DAY. Please keep upcoming appt in July for future refills. Thank you 90 capsule 2  . FLUoxetine (PROZAC) 20 MG capsule Take 1 capsule (20 mg total) by mouth daily. 90 capsule 0  . metoprolol succinate (TOPROL-XL) 50  MG 24 hr tablet Take 1/2 tablet by mouth if needed for breakthrough afib for HR >100 as long as BP > 100 30 tablet 9  . rivaroxaban (XARELTO) 20 MG TABS tablet TAKE 1 TABLET (20 MG TOTAL) BY MOUTH DAILY WITH SUPPER. 90 tablet 3  . rosuvastatin (CRESTOR) 40 MG tablet Take 1 tablet (40 mg total) by mouth daily. 90 tablet 0  . valsartan (DIOVAN) 80 MG tablet Take 1 tablet (80 mg total) by mouth daily. 90 tablet 0  . gabapentin (NEURONTIN) 300 MG capsule Take 1 capsule (300 mg total) by mouth 3 (three) times daily as needed (nerve pain). (Patient not taking: Reported on 08/12/2020) 90 capsule 3   Facility-Administered Medications Prior to Visit  Medication Dose Route Frequency Provider Last Rate Last Admin  . 0.9 %  sodium chloride infusion  500 mL Intravenous Continuous Nandigam, Kavitha V, MD        No Known Allergies  ROS Review of Systems  Constitutional: Negative for fatigue.  Eyes: Negative for visual disturbance.  Respiratory: Negative for cough, chest tightness, shortness of breath and wheezing.   Cardiovascular: Negative for chest pain, palpitations and leg swelling.  Neurological: Negative for dizziness, seizures, syncope, weakness, light-headedness and headaches.      Objective:    Physical Exam Vitals reviewed.  Constitutional:      Appearance: Normal appearance.  Cardiovascular:     Rate and Rhythm: Normal rate.  Pulmonary:     Effort: Pulmonary effort is normal.     Breath sounds: Normal breath sounds.  Musculoskeletal:     Cervical back: Neck supple.     Right lower leg: No edema.     Left lower leg: No edema.  Neurological:     Mental Status: She is alert.     BP 136/74 (BP Location: Left Arm, Patient Position: Sitting, Cuff Size: Normal)   Pulse 60   Temp 98.1 F (36.7 C) (Oral)   Ht 5\' 7"  (1.702 m)   Wt 188 lb 4.8 oz (85.4 kg)   SpO2 96%   BMI 29.49 kg/m  Wt Readings from Last 3 Encounters:  08/12/20 188 lb 4.8 oz (85.4 kg)  06/18/20 187 lb 6.4 oz  (85 kg)  06/02/20 188 lb (85.3 kg)     Health Maintenance Due  Topic Date Due  . HIV Screening  Never done  . PAP SMEAR-Modifier  04/22/2019  . DEXA SCAN  Never done  . PNA vac Low Risk Adult (1 of 2 - PCV13)  09/26/2019  . TETANUS/TDAP  12/12/2019  . MAMMOGRAM  05/19/2020    There are no preventive care reminders to display for this patient.  Lab Results  Component Value Date   TSH 1.956 02/27/2018   Lab Results  Component Value Date   WBC 7.1 04/29/2020   HGB 14.0 04/29/2020   HCT 40.1 04/29/2020   MCV 89 04/29/2020   PLT 334 04/29/2020   Lab Results  Component Value Date   NA 132 (L) 04/29/2020   K 4.9 04/29/2020   CO2 22 04/29/2020   GLUCOSE 89 04/29/2020   BUN 12 04/29/2020   CREATININE 0.67 04/29/2020   BILITOT 0.9 04/04/2019   ALKPHOS 77 04/04/2019   AST 18 04/04/2019   ALT 16 04/04/2019   PROT 6.9 04/04/2019   ALBUMIN 4.4 04/04/2019   CALCIUM 9.9 04/29/2020   ANIONGAP 10 01/10/2019   GFR 99.61 09/27/2017   Lab Results  Component Value Date   CHOL 248 (H) 04/04/2019   Lab Results  Component Value Date   HDL 86.80 04/04/2019   Lab Results  Component Value Date   LDLCALC 148 (H) 04/04/2019   Lab Results  Component Value Date   TRIG 68.0 04/04/2019   Lab Results  Component Value Date   CHOLHDL 3 04/04/2019   No results found for: HGBA1C    Assessment & Plan:   Problem List Items Addressed This Visit      Unprioritized   Hyperlipidemia   Relevant Medications   rosuvastatin (CRESTOR) 40 MG tablet   valsartan (DIOVAN) 80 MG tablet   Other Relevant Orders   Lipid panel   Hepatic function panel   Paroxysmal atrial fibrillation (HCC)   Relevant Medications   rosuvastatin (CRESTOR) 40 MG tablet   valsartan (DIOVAN) 80 MG tablet   Essential hypertension   Relevant Medications   rosuvastatin (CRESTOR) 40 MG tablet   valsartan (DIOVAN) 80 MG tablet    Other Visit Diagnoses    Need for influenza vaccination    -  Primary   Relevant  Orders   Flu Vaccine QUAD High Dose(Fluad) (Completed)    -Flu vaccine given -Refilled losartan and Crestor for 1 year -Check lipid and hepatic panel -Establish more consistent exercise.  She is encouraged to try to lose some weight.  Meds ordered this encounter  Medications  . rosuvastatin (CRESTOR) 40 MG tablet    Sig: Take 1 tablet (40 mg total) by mouth daily.    Dispense:  90 tablet    Refill:  3  . valsartan (DIOVAN) 80 MG tablet    Sig: Take 1 tablet (80 mg total) by mouth daily.    Dispense:  90 tablet    Refill:  3    Follow-up: Return in about 1 year (around 08/12/2021).    Carolann Littler, MD

## 2020-08-13 LAB — LIPID PANEL
Cholesterol: 243 mg/dL — ABNORMAL HIGH (ref <200)
HDL: 101 mg/dL (ref >=50)
LDL Cholesterol (Calc): 125 mg/dL (calc) — ABNORMAL HIGH
Non-HDL Cholesterol (Calc): 142 mg/dL (calc) — ABNORMAL HIGH (ref <130)
Total CHOL/HDL Ratio: 2.4 (calc) (ref <5.0)
Triglycerides: 76 mg/dL (ref <150)

## 2020-08-13 LAB — HEPATIC FUNCTION PANEL
AG Ratio: 1.9 (calc) (ref 1.0–2.5)
ALT: 16 U/L (ref 6–29)
AST: 18 U/L (ref 10–35)
Albumin: 4.5 g/dL (ref 3.6–5.1)
Alkaline phosphatase (APISO): 71 U/L (ref 37–153)
Bilirubin, Direct: 0.2 mg/dL (ref 0.0–0.2)
Globulin: 2.4 g/dL (calc) (ref 1.9–3.7)
Indirect Bilirubin: 0.8 mg/dL (calc) (ref 0.2–1.2)
Total Bilirubin: 1 mg/dL (ref 0.2–1.2)
Total Protein: 6.9 g/dL (ref 6.1–8.1)

## 2020-08-14 ENCOUNTER — Other Ambulatory Visit: Payer: Self-pay

## 2020-08-14 MED ORDER — EZETIMIBE 10 MG PO TABS: 10.0000 mg | ORAL_TABLET | Freq: Every day | ORAL | 3 refills | Status: DC

## 2020-08-15 ENCOUNTER — Other Ambulatory Visit: Payer: Self-pay | Admitting: Family Medicine

## 2020-08-20 ENCOUNTER — Ambulatory Visit: Payer: Medicare Other | Admitting: Orthopaedic Surgery

## 2020-08-28 DIAGNOSIS — M85852 Other specified disorders of bone density and structure, left thigh: Secondary | ICD-10-CM | POA: Diagnosis not present

## 2020-08-28 DIAGNOSIS — M85851 Other specified disorders of bone density and structure, right thigh: Secondary | ICD-10-CM | POA: Diagnosis not present

## 2020-09-11 DIAGNOSIS — M8580 Other specified disorders of bone density and structure, unspecified site: Secondary | ICD-10-CM | POA: Diagnosis not present

## 2020-09-16 ENCOUNTER — Ambulatory Visit: Payer: Medicare Other | Admitting: Physician Assistant

## 2020-10-21 ENCOUNTER — Telehealth: Payer: Self-pay | Admitting: Family Medicine

## 2020-10-21 NOTE — Telephone Encounter (Signed)
Left message for patient to call back and schedule Medicare Annual Wellness Visit (AWV) either virtually or in office.   Last AWV no information please schedule at anytime with [facility] Nurse Health Advisor 1 or 2   This should be a 45 minute visit. 

## 2020-10-22 ENCOUNTER — Encounter: Payer: Self-pay | Admitting: Family Medicine

## 2020-10-23 ENCOUNTER — Other Ambulatory Visit: Payer: Self-pay | Admitting: Family Medicine

## 2020-11-05 ENCOUNTER — Other Ambulatory Visit: Payer: Self-pay

## 2020-11-05 MED ORDER — RIVAROXABAN 20 MG PO TABS
ORAL_TABLET | ORAL | 0 refills | Status: DC
Start: 2020-11-05 — End: 2021-02-16

## 2020-11-05 NOTE — Telephone Encounter (Signed)
Prescription refill request for Xarelto received.  Indication: PAF Last office visit: 04/29/20 Weight: 85kg Age: 65 Scr:0.67 CrCl: 110.83  Refill approved

## 2020-11-17 ENCOUNTER — Ambulatory Visit: Payer: Medicare Other

## 2020-11-27 NOTE — Progress Notes (Unsigned)
Subjective:   Stephanie [name] is a 65 y.o. female who presents for an Initial Medicare Annual Wellness Visit.  Review of Systems    N/A  Cardiac Risk Factors include: advanced age (>69men, >81 women);hypertension;dyslipidemia     Objective:    Today's Vitals   11/28/20 1113 11/28/20 1141  BP: (!) 160/88 (!) 158/84  Pulse: 60   Temp: 98.4 F (36.9 C)   TempSrc: Oral   SpO2: 96%   Weight: 185 lb 8 oz (84.1 kg)   Height: 5\' 7"  (1.702 m)   PainSc: 5     Body mass index is 29.05 kg/m.  Advanced Directives 11/28/2020 04/08/2020 01/10/2019 05/06/2018 09/10/2016  Does Patient Have a Medical Advance Directive? No No No No No  Would patient like information on creating a medical advance directive? Yes (MAU/Ambulatory/Procedural Areas - Information given) - - - -    Current Medications (verified) Outpatient Encounter Medications as of 11/28/2020  Medication Sig  . alendronate (FOSAMAX) 70 MG tablet Take 70 mg by mouth once a week.  . ALPRAZolam (XANAX) 0.5 MG tablet Take 1 tablet (0.5 mg total) by mouth 3 (three) times daily as needed for anxiety.  Marland Kitchen diltiazem (CARDIZEM CD) 180 MG 24 hr capsule TAKE 1 CAPSULE BY MOUTH EVERY DAY. Please keep upcoming appt in July for future refills. Thank you  . FLUoxetine (PROZAC) 20 MG capsule TAKE 1 CAPSULE BY MOUTH  DAILY  . metoprolol succinate (TOPROL-XL) 50 MG 24 hr tablet Take 1/2 tablet by mouth if needed for breakthrough afib for HR >100 as long as BP > 100  . rivaroxaban (XARELTO) 20 MG TABS tablet TAKE 1 TABLET (20 MG TOTAL) BY MOUTH DAILY WITH SUPPER.  . rosuvastatin (CRESTOR) 40 MG tablet Take 1 tablet (40 mg total) by mouth daily.  . valsartan (DIOVAN) 80 MG tablet Take 1 tablet (80 mg total) by mouth daily.  Marland Kitchen ezetimibe (ZETIA) 10 MG tablet Take 1 tablet (10 mg total) by mouth daily. (Patient not taking: Reported on 11/28/2020)  . [DISCONTINUED] cholecalciferol (VITAMIN D) 1000 UNITS tablet Take 1,000 Units by mouth daily.   (Patient not  taking: Reported on 11/28/2020)   Facility-Administered Encounter Medications as of 11/28/2020  Medication  . 0.9 %  sodium chloride infusion    Allergies (verified) Patient has no known allergies.   History: Past Medical History:  Diagnosis Date  . Diverticulitis 06/2016  . Hyperlipidemia   . Hypertension   . Osteoporosis   . Paroxysmal atrial fibrillation ([place]) 01/26/2016   History reviewed. No pertinent surgical history. Family History  Problem Relation Age of Onset  . Heart disease Father 25       CAD  . Sudden death Other        family hx  . Alzheimer's disease Mother   . Heart disease Brother 26       CAD   Social History   Socioeconomic History  . Marital status: Married    Spouse name: Not on file  . Number of children: 2  . Years of education: Not on file  . Highest education level: Not on file  Occupational History  . Not on file  Tobacco Use  . Smoking status: Former Smoker    Packs/day: 1.00    Years: 30.00    Pack years: 30.00    Types: Cigarettes    Quit date: 05/31/2010    Years since quitting: 10.5  . Smokeless tobacco: Never Used  Vaping Use  . Vaping  Use: Never used  Substance and Sexual Activity  . Alcohol use: Yes    Alcohol/week: 2.0 standard drinks    Types: 2 Cans of beer per week    Comment: occ  . Drug use: No  . Sexual activity: Not on file  Other Topics Concern  . Not on file  Social History Narrative  . Not on file   Social Determinants of Health   Financial Resource Strain: Low Risk   . Difficulty of Paying Living Expenses: Not hard at all  Food Insecurity: No Food Insecurity  . Worried About Charity fundraiser in the Last Year: Never true  . Ran Out of Food in the Last Year: Never true  Transportation Needs: No Transportation Needs  . Lack of Transportation (Medical): No  . Lack of Transportation (Non-Medical): No  Physical Activity: Inactive  . Days of Exercise per Week: 0 days  . Minutes of Exercise per Session: 0  min  Stress: No Stress Concern Present  . Feeling of Stress : Not at all  Social Connections: Moderately Isolated  . Frequency of Communication with Friends and Family: Twice a week  . Frequency of Social Gatherings with Friends and Family: Once a week  . Attends Religious Services: Never  . Active Member of Clubs or Organizations: No  . Attends Archivist Meetings: Never  . Marital Status: Married    Tobacco Counseling Counseling given: Not Answered   Clinical Intake:  Pre-visit preparation completed: Yes  Pain : 0-10 Pain Score: 5  Pain Type: Chronic pain Pain Location: Back Pain Orientation: Lower Pain Descriptors / Indicators: Aching Pain Onset: More than a month ago Pain Frequency: Intermittent Pain Relieving Factors: Exercising  Pain Relieving Factors: Exercising  Nutritional Risks: None Diabetes: No  How often do you need to have someone help you when you read instructions, pamphlets, or other written materials from your doctor or pharmacy?: 1 - Never  Diabetic?No   Interpreter Needed?: No  Information entered by :: [place] of Daily Living In your present state of health, do you have any difficulty performing the following activities: 11/28/2020  Hearing? Y  Vision? N  Difficulty concentrating or making decisions? N  Walking or climbing stairs? N  Dressing or bathing? N  Doing errands, shopping? N  Preparing Food and eating ? N  Using the Toilet? N  In the past six months, have you accidently leaked urine? Y  Comment has occassional stress incontinence  Do you have problems with loss of bowel control? N  Managing your Medications? N  Managing your Finances? N  Housekeeping or managing your Housekeeping? N  Some recent data might be hidden    Patient Care Team: Eulas Post, MD as PCP - General  Indicate any recent Medical Services you may have received from other than [facility] providers in the past year (date may be  approximate).     Assessment:   This is a routine wellness examination for Stephanie [name].  Hearing/Vision screen  Hearing Screening   125Hz  250Hz  500Hz  1000Hz  2000Hz  3000Hz  4000Hz  6000Hz  8000Hz   Right ear:           Left ear:           Vision Screening Comments: Gets eye exams once every other year. Wears reading glasses    Dietary issues and exercise activities discussed: Current Exercise Habits: The patient does not participate in regular exercise at present, Exercise limited by: None identified  Goals    .  Patient Stated     I would like to start walking more!      Depression Screen PHQ 2/9 Scores 11/28/2020 01/18/2020  PHQ - 2 Score 0 0    Fall Risk Fall Risk  11/28/2020  Falls in the past year? 0  Number falls in past yr: 0  Injury with Fall? 0  Risk for fall due to : No Fall Risks  Follow up Falls evaluation completed;Falls prevention discussed    FALL RISK PREVENTION PERTAINING TO THE HOME:  Any stairs in or around the home? Yes  If so, are there any without handrails? No  Home free of loose throw rugs in walkways, pet beds, electrical cords, etc? Yes  Adequate lighting in your home to reduce risk of falls? Yes   ASSISTIVE DEVICES UTILIZED TO PREVENT FALLS:  Life alert? No  Use of a cane, walker or w/c? No  Grab bars in the bathroom? No  Shower chair or bench in shower? Yes  Elevated toilet seat or a handicapped toilet? No   TIMED UP AND GO:  Was the test performed? Yes .  Length of time to ambulate 10 feet: 3 sec.   Gait steady and fast without use of assistive device  Cognitive Function:   Normal cognitive status assessed by direct observation by this Nurse Health Advisor. No abnormalities found.        Immunizations Immunization History  Administered Date(s) Administered  . Fluad Quad(high Dose 65+) 08/12/2020  . Influenza Split 06/28/2012, 07/13/2013, 06/27/2014, 06/18/2015  . Influenza, Quadrivalent, Recombinant, Inj, Pf 08/06/2019  .  Influenza,inj,Quad PF,6+ Mos 07/07/2017, 07/05/2018  . Influenza-Unspecified 07/04/2016, 07/07/2017, 07/05/2018  . Moderna Sars-Covid-2 Vaccination 11/22/2019, 12/21/2019, 08/21/2020  . Pneumococcal Polysaccharide-23 09/01/2009  . Td 12/11/2009  . Zoster 04/21/2015    TDAP status: Due, Education has been provided regarding the importance of this vaccine. Advised may receive this vaccine at local pharmacy or Health Dept. Aware to provide a copy of the vaccination record if obtained from local pharmacy or Health Dept. Verbalized acceptance and understanding.  Flu Vaccine status: Up to date  Pneumococcal vaccine status: Due, Education has been provided regarding the importance of this vaccine. Advised may receive this vaccine at local pharmacy or Health Dept. Aware to provide a copy of the vaccination record if obtained from local pharmacy or Health Dept. Verbalized acceptance and understanding.  Covid-19 vaccine status: Completed vaccines  Qualifies for Shingles Vaccine? Yes   Zostavax completed Yes   Shingrix Completed?: No.    Education has been provided regarding the importance of this vaccine. Patient has been advised to call insurance company to determine out of pocket expense if they have not yet received this vaccine. Advised may also receive vaccine at local pharmacy or Health Dept. Verbalized acceptance and understanding.  Screening Tests Health Maintenance  Topic Date Due  . PNA vac Low Risk Adult (1 of 2 - PCV13) 09/26/2019  . TETANUS/TDAP  12/12/2019  . MAMMOGRAM  07/21/2022  . COLONOSCOPY (Pts 45-83yrs Insurance coverage will need to be confirmed)  09/10/2026  . INFLUENZA VACCINE  Completed  . DEXA SCAN  Completed  . COVID-19 Vaccine  Completed  . Hepatitis C Screening  Completed    Health Maintenance  Health Maintenance Due  Topic Date Due  . PNA vac Low Risk Adult (1 of 2 - PCV13) 09/26/2019  . TETANUS/TDAP  12/12/2019    Colorectal cancer screening: Type of  screening: Colonoscopy. Completed 09/10/2016. Repeat every 10 years  Mammogram status:  Completed 07/21/2020. Repeat every year  Bone Density Status: Completed on 08/28/2020. Showed Osteoporosis repeat in 2 years.  Lung Cancer Screening: (Low Dose CT Chest recommended if Age 4-80 years, 30 pack-year currently smoking OR have quit w/in 15years.) does qualify.   Lung Cancer Screening Referral: N/A   Additional Screening:  Hepatitis C Screening: does qualify; Completed 09/27/2017  Vision Screening: Recommended annual ophthalmology exams for early detection of glaucoma and other disorders of the eye. Is the patient up to date with their annual eye exam?  Yes  Who is the provider or what is the name of the office in which the patient attends annual eye exams? Dr. Macarthur Critchley If pt is not established with a provider, would they like to be referred to a provider to establish care? No .   Dental Screening: Recommended annual dental exams for proper oral hygiene  Community Resource Referral / Chronic Care Management: CRR required this visit?  No   CCM required this visit?  No      Plan:     I have personally reviewed and noted the following in the patient's chart:   . Medical and social history . Use of alcohol, tobacco or illicit drugs  . Current medications and supplements . Functional ability and status . Nutritional status . Physical activity . Advanced directives . List of other physicians . Hospitalizations, surgeries, and ER visits in previous 12 months . Vitals . Screenings to include cognitive, depression, and falls . Referrals and appointments  In addition, I have reviewed and discussed with patient certain preventive protocols, quality metrics, and best practice recommendations. A written personalized care plan for preventive services as well as general preventive health recommendations were provided to patient.     Ofilia Neas, LPN   0/88/1103   Nurse Notes:  None

## 2020-11-28 ENCOUNTER — Ambulatory Visit (INDEPENDENT_AMBULATORY_CARE_PROVIDER_SITE_OTHER): Payer: Medicare HMO

## 2020-11-28 ENCOUNTER — Other Ambulatory Visit: Payer: Self-pay

## 2020-11-28 ENCOUNTER — Telehealth: Payer: Self-pay | Admitting: Family Medicine

## 2020-11-28 VITALS — BP 158/84 | HR 60 | Temp 98.4°F | Ht 67.0 in | Wt 185.5 lb

## 2020-11-28 DIAGNOSIS — Z0000 Encounter for general adult medical examination without abnormal findings: Secondary | ICD-10-CM

## 2020-11-28 NOTE — Telephone Encounter (Signed)
Unfortunately, there is no generic equivalent.  She is already on high-dose Crestor.  I would hold the Zetia at this time if she cannot afford

## 2020-11-28 NOTE — Telephone Encounter (Signed)
Spoke with patient and informed her of recommendations as stated below by Dr. Elease Hashimoto. Patient verbalized understanding

## 2020-11-28 NOTE — Patient Instructions (Signed)
Ms. Stephanie [name] , Thank you for taking time to come for your Medicare Wellness Visit. I appreciate your ongoing commitment to your health goals. Please review the following plan we discussed and let me know if I can assist you in the future.   Screening recommendations/referrals: Colonoscopy: Up to date, next due 09/10/2026 Mammogram: Up to date, next due 07/21/2021 Bone Density:Up to date, next due 08/28/2022 Recommended yearly ophthalmology/optometry visit for glaucoma screening and checkup Recommended yearly dental visit for hygiene and checkup  Vaccinations: Influenza vaccine: Up to date, next due fall 2022  Pneumococcal vaccine: Currently due for prevnar 13 you may receive at any time either at our office or your pharmacy. Tdap vaccine: Currently due, you may await and injury to receive when it will be covered by medicare or you may receive at your local pharmacy  Shingles vaccine: Currently due for Shingrix, if you would like to receive we recommend that you receive at your local pharmacy as it is less expensive     Advanced directives: Advanced Directive paper work given to you today. Please fill out and bring in so that we may scan a copy into your chart.   Conditions/risks identified: None   Next appointment: None    Preventive Care 65 Years and Older, Female Preventive care refers to lifestyle choices and visits with your health care provider that can promote health and wellness. What does preventive care include?  A yearly physical exam. This is also called an annual well check.  Dental exams once or twice a year.  Routine eye exams. Ask your health care provider how often you should have your eyes checked.  Personal lifestyle choices, including:  Daily care of your teeth and gums.  Regular physical activity.  Eating a healthy diet.  Avoiding tobacco and drug use.  Limiting alcohol use.  Practicing safe sex.  Taking low-dose aspirin every day.  Taking vitamin and  mineral supplements as recommended by your health care provider. What happens during an annual well check? The services and screenings done by your health care provider during your annual well check will depend on your age, overall health, lifestyle risk factors, and family history of disease. Counseling  Your health care provider may ask you questions about your:  Alcohol use.  Tobacco use.  Drug use.  Emotional well-being.  Home and relationship well-being.  Sexual activity.  Eating habits.  History of falls.  Memory and ability to understand (cognition).  Work and work Statistician.  Reproductive health. Screening  You may have the following tests or measurements:  Height, weight, and BMI.  Blood pressure.  Lipid and cholesterol levels. These may be checked every 5 years, or more frequently if you are over 50 years old.  Skin check.  Lung cancer screening. You may have this screening every year starting at age 21 if you have a 30-pack-year history of smoking and currently smoke or have quit within the past 15 years.  Fecal occult blood test (FOBT) of the stool. You may have this test every year starting at age 51.  Flexible sigmoidoscopy or colonoscopy. You may have a sigmoidoscopy every 5 years or a colonoscopy every 10 years starting at age 28.  Hepatitis C blood test.  Hepatitis B blood test.  Sexually transmitted disease (STD) testing.  Diabetes screening. This is done by checking your blood sugar (glucose) after you have not eaten for a while (fasting). You may have this done every 1-3 years.  Bone density scan. This is  done to screen for osteoporosis. You may have this done starting at age 52.  Mammogram. This may be done every 1-2 years. Talk to your health care provider about how often you should have regular mammograms. Talk with your health care provider about your test results, treatment options, and if necessary, the need for more tests. Vaccines   Your health care provider may recommend certain vaccines, such as:  Influenza vaccine. This is recommended every year.  Tetanus, diphtheria, and acellular pertussis (Tdap, Td) vaccine. You may need a Td booster every 10 years.  Zoster vaccine. You may need this after age 78.  Pneumococcal 13-valent conjugate (PCV13) vaccine. One dose is recommended after age 14.  Pneumococcal polysaccharide (PPSV23) vaccine. One dose is recommended after age 6. Talk to your health care provider about which screenings and vaccines you need and how often you need them. This information is not intended to replace advice given to you by your health care provider. Make sure you discuss any questions you have with your health care provider. Document Released: 10/24/2015 Document Revised: 06/16/2016 Document Reviewed: 07/29/2015 Elsevier Interactive Patient Education  2017 [employer] Prevention in the Home Falls can cause injuries. They can happen to people of all ages. There are many things you can do to make your home safe and to help prevent falls. What can I do on the outside of my home?  Regularly fix the edges of walkways and driveways and fix any cracks.  Remove anything that might make you trip as you walk through a door, such as a raised step or threshold.  Trim any bushes or trees on the path to your home.  Use bright outdoor lighting.  Clear any walking paths of anything that might make someone trip, such as rocks or tools.  Regularly check to see if handrails are loose or broken. Make sure that both sides of any steps have handrails.  Any raised decks and porches should have guardrails on the edges.  Have any leaves, snow, or ice cleared regularly.  Use sand or salt on walking paths during winter.  Clean up any spills in your garage right away. This includes oil or grease spills. What can I do in the bathroom?  Use night lights.  Install grab bars by the toilet and in the  tub and shower. Do not use towel bars as grab bars.  Use non-skid mats or decals in the tub or shower.  If you need to sit down in the shower, use a plastic, non-slip stool.  Keep the floor dry. Clean up any water that spills on the floor as soon as it happens.  Remove soap buildup in the tub or shower regularly.  Attach bath mats securely with double-sided non-slip rug tape.  Do not have throw rugs and other things on the floor that can make you trip. What can I do in the bedroom?  Use night lights.  Make sure that you have a light by your bed that is easy to reach.  Do not use any sheets or blankets that are too big for your bed. They should not hang down onto the floor.  Have a firm chair that has side arms. You can use this for support while you get dressed.  Do not have throw rugs and other things on the floor that can make you trip. What can I do in the kitchen?  Clean up any spills right away.  Avoid walking on wet floors.  Keep  items that you use a lot in easy-to-reach places.  If you need to reach something above you, use a strong step stool that has a grab bar.  Keep electrical cords out of the way.  Do not use floor polish or wax that makes floors slippery. If you must use wax, use non-skid floor wax.  Do not have throw rugs and other things on the floor that can make you trip. What can I do with my stairs?  Do not leave any items on the stairs.  Make sure that there are handrails on both sides of the stairs and use them. Fix handrails that are broken or loose. Make sure that handrails are as long as the stairways.  Check any carpeting to make sure that it is firmly attached to the stairs. Fix any carpet that is loose or worn.  Avoid having throw rugs at the top or bottom of the stairs. If you do have throw rugs, attach them to the floor with carpet tape.  Make sure that you have a light switch at the top of the stairs and the bottom of the stairs. If you  do not have them, ask someone to add them for you. What else can I do to help prevent falls?  Wear shoes that:  Do not have high heels.  Have rubber bottoms.  Are comfortable and fit you well.  Are closed at the toe. Do not wear sandals.  If you use a stepladder:  Make sure that it is fully opened. Do not climb a closed stepladder.  Make sure that both sides of the stepladder are locked into place.  Ask someone to hold it for you, if possible.  Clearly mark and make sure that you can see:  Any grab bars or handrails.  First and last steps.  Where the edge of each step is.  Use tools that help you move around (mobility aids) if they are needed. These include:  Canes.  Walkers.  Scooters.  Crutches.  Turn on the lights when you go into a dark area. Replace any light bulbs as soon as they burn out.  Set up your furniture so you have a clear path. Avoid moving your furniture around.  If any of your floors are uneven, fix them.  If there are any pets around you, be aware of where they are.  Review your medicines with your doctor. Some medicines can make you feel dizzy. This can increase your chance of falling. Ask your doctor what other things that you can do to help prevent falls. This information is not intended to replace advice given to you by your health care provider. Make sure you discuss any questions you have with your health care provider. Document Released: 07/24/2009 Document Revised: 03/04/2016 Document Reviewed: 11/01/2014 Elsevier Interactive Patient Education  2017 [employer].

## 2020-11-28 NOTE — Telephone Encounter (Signed)
Patient came in for medicare wellness visit and stated that the Zetia is to expensive. She would like to know if you recommend a medication that is less expensive. She sent a mychart message and was told to come in but she was wondering if medication could be changed without and office visit.Marland Kitchen

## 2020-11-30 ENCOUNTER — Encounter: Payer: Self-pay | Admitting: Family Medicine

## 2020-12-01 MED ORDER — DILTIAZEM HCL ER COATED BEADS 180 MG PO CP24
ORAL_CAPSULE | ORAL | 3 refills | Status: DC
Start: 2020-12-01 — End: 2022-02-17

## 2020-12-24 ENCOUNTER — Other Ambulatory Visit: Payer: Self-pay | Admitting: Family Medicine

## 2020-12-24 ENCOUNTER — Encounter: Payer: Self-pay | Admitting: Family Medicine

## 2020-12-24 MED ORDER — ROSUVASTATIN CALCIUM 40 MG PO TABS
40.0000 mg | ORAL_TABLET | Freq: Every day | ORAL | 3 refills | Status: DC
Start: 2020-12-24 — End: 2021-12-21

## 2021-01-30 IMAGING — MR MR HIP*R* W/O CM
5 series · 29 of 40 positions shown · non-contrast
Comparison: None.

CLINICAL DATA: Chronic right hip pain over the last 2 years.

EXAM:
MR OF THE RIGHT HIP WITHOUT CONTRAST
TECHNIQUE: Multiplanar, multisequence MR imaging was performed. No intravenous
contrast was administered.

[Series 3: T1 · coronal · 4.0mm · 0.85mm/px · 8 of 36 slices shown]
[im 1/36]
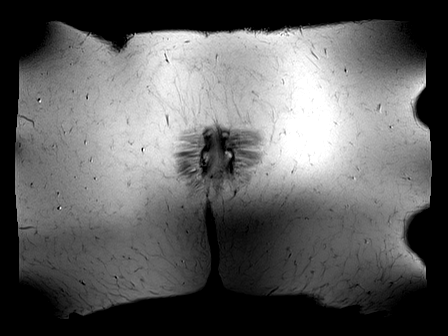
[im 4/36]
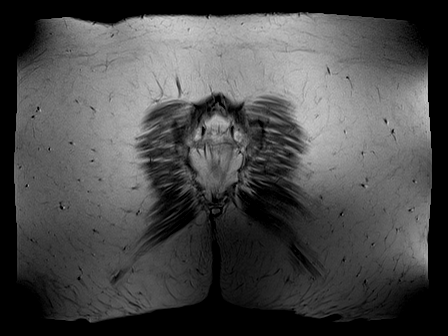
[im 12/36]
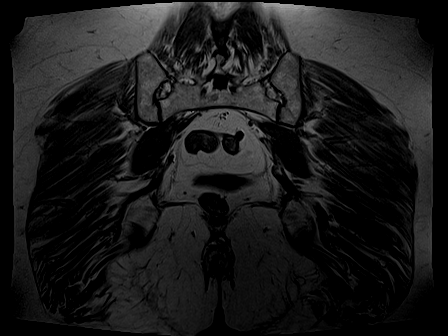
[im 16/36]
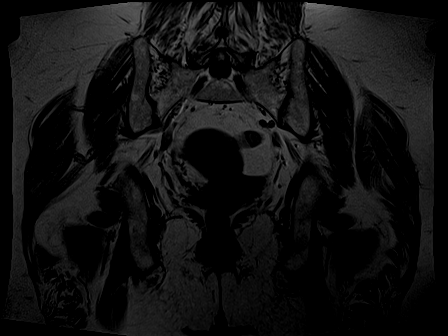
[im 20/36]
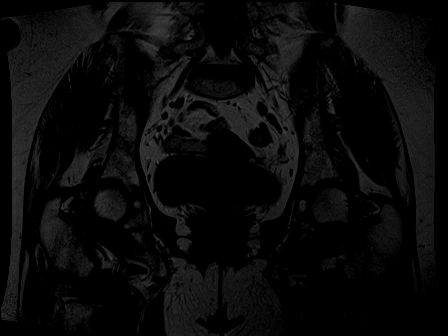
[im 24/36]
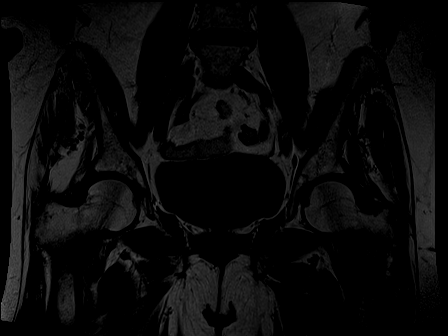
[im 32/36]
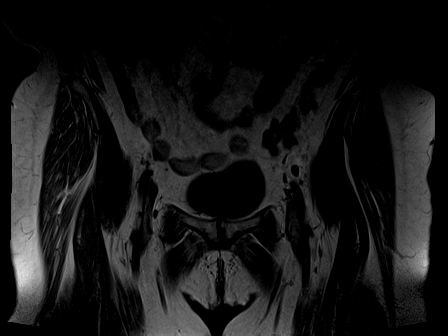
[im 36/36]
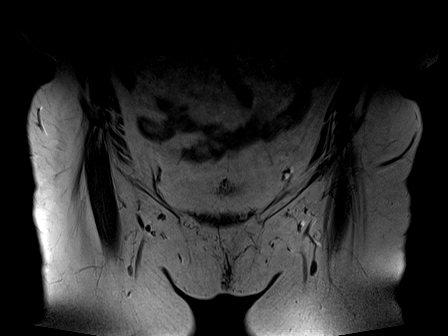

[Series 4: STIR · coronal · 4.0mm · 1.19mm/px · 8 of 36 slices shown (1 of 2)]
[im 1/36]
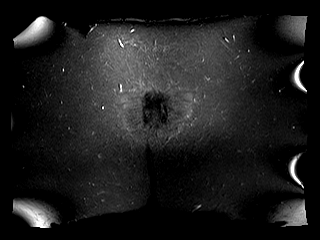
[im 4/36]
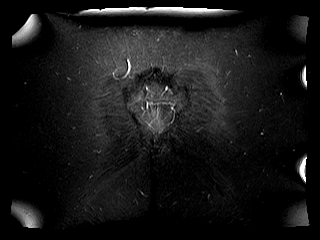
[im 12/36]
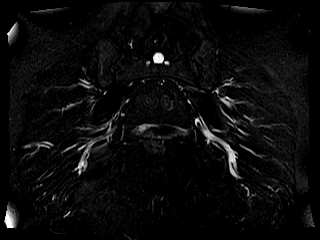
[im 16/36]
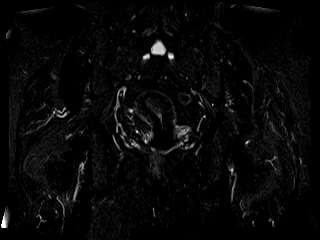
[im 20/36]
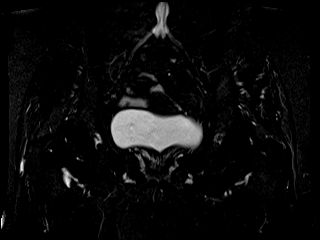
[im 24/36]
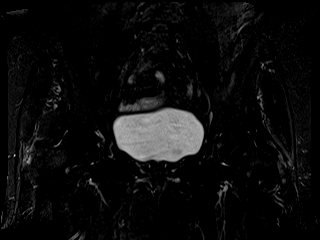
[im 32/36]
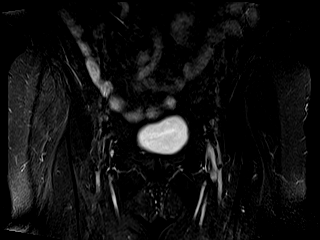
[im 36/36]
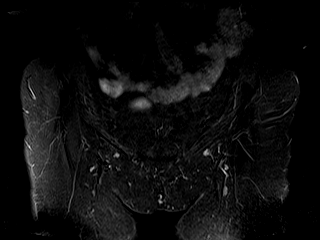

[Series 5: STIR · axial · 4.0mm · 0.70mm/px · 1 of 27 slices shown (2 of 2)]
[im 1/27]
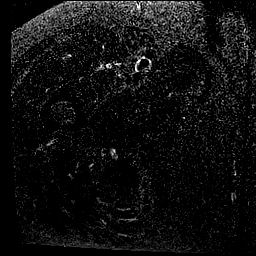

[Series 6: PD fat-sat · sagittal · 4.5mm · 0.35mm/px · 6 of 23 slices shown (1 of 2)]
[im 1/23]
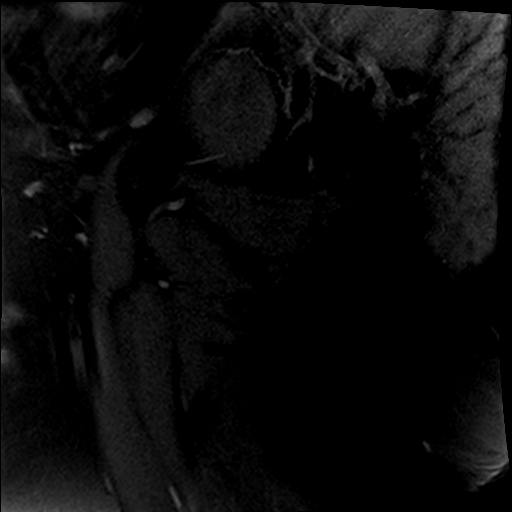
[im 5/23]
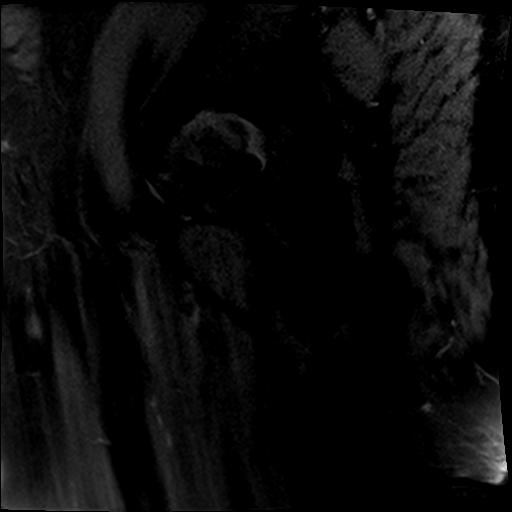
[im 9/23]
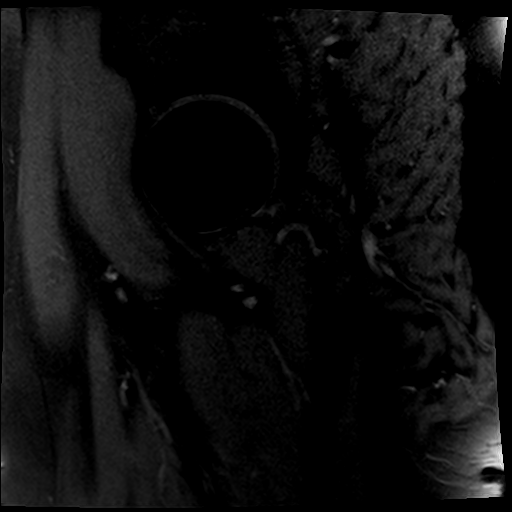
[im 14/23]
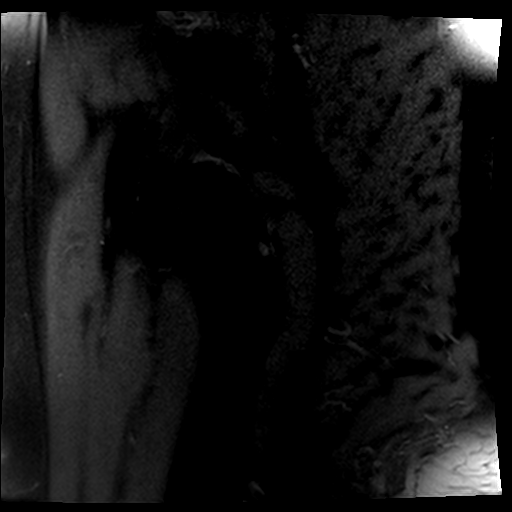
[im 18/23]
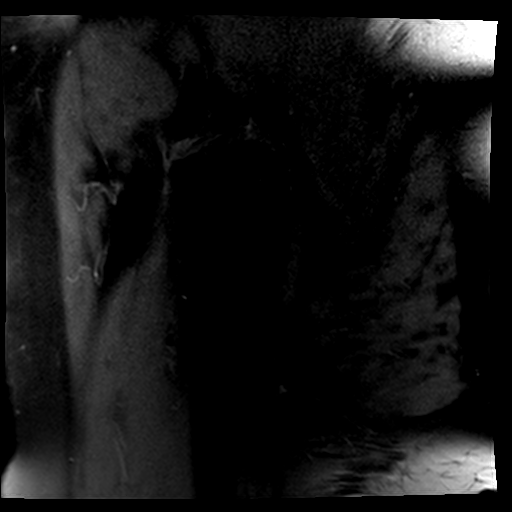
[im 23/23]
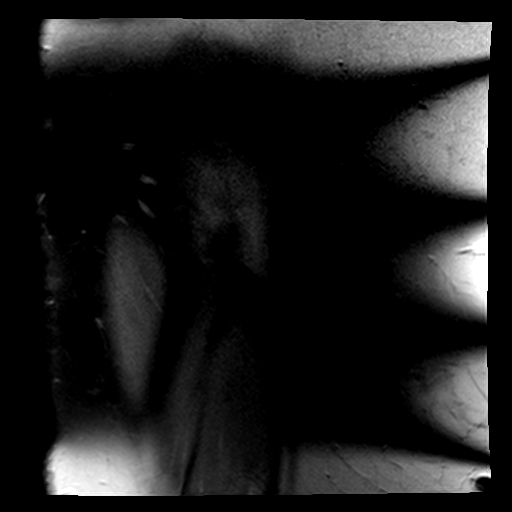

[Series 7: PD fat-sat · coronal · 4.5mm · 0.35mm/px · 6 of 21 slices shown (2 of 2)]
[im 1/21]
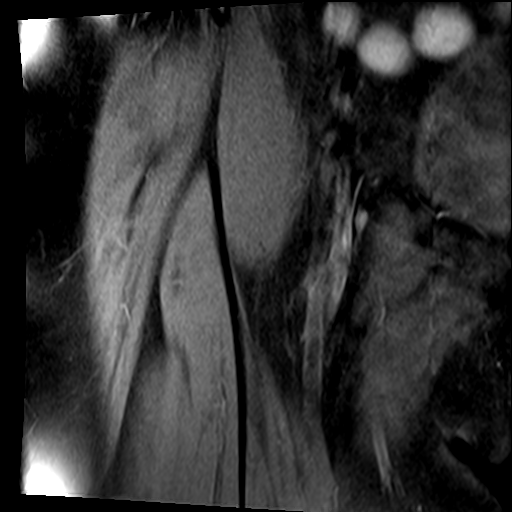
[im 5/21]
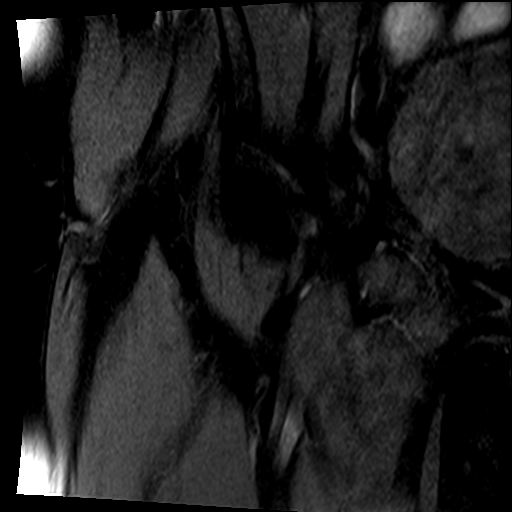
[im 9/21]
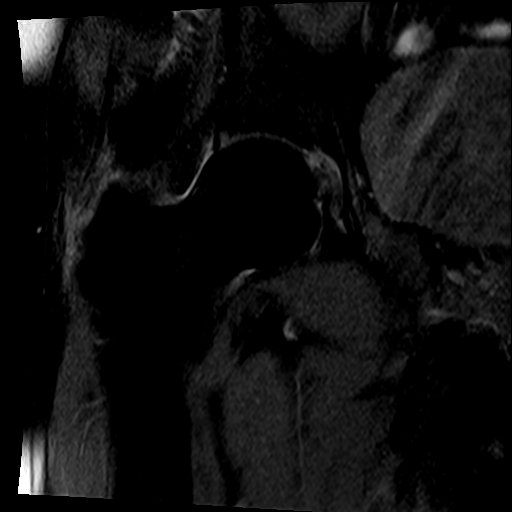
[im 13/21]
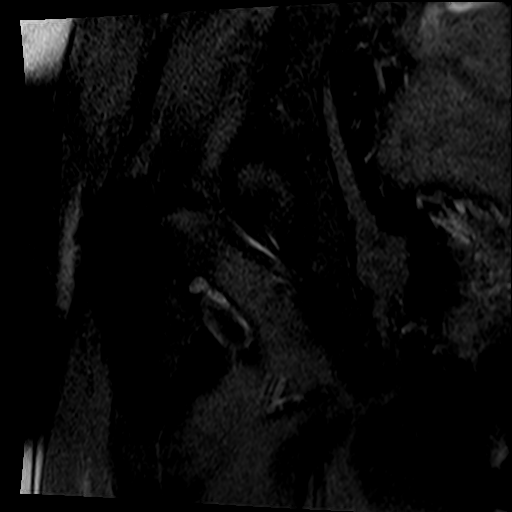
[im 17/21]
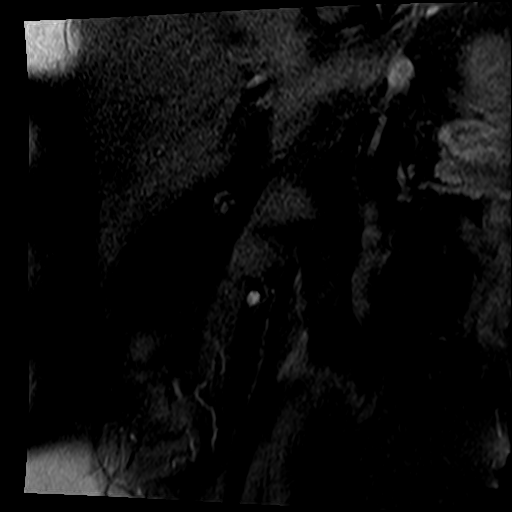
[im 21/21]
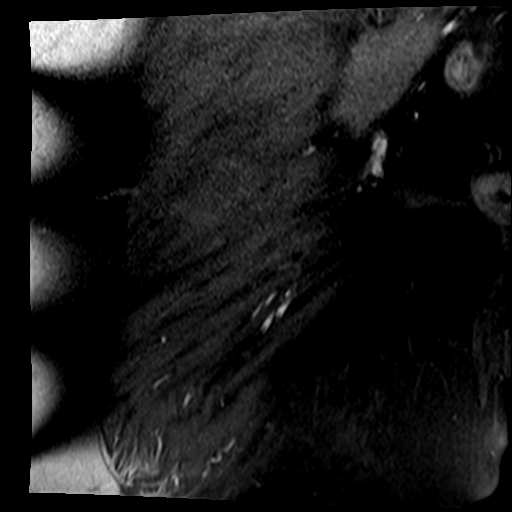

[29 of 40 positions shown; findings below may reference images not displayed]

FINDINGS: Bones: Mild spurring of both acetabula and of the right femoral
head.

Articular cartilage and labrum

Articular cartilage: Mild axial loss of articular cartilage
thickness bilaterally. No focal chondral lesion is observed.

Labrum: Moderate suspicion for an anterior superior labral tear
based on accentuated signal on images 8 through 10 of series 6. This
accentuated signal is blurred likely due to motion. No paralabral
cyst.

Joint or bursal effusion

Joint effusion:  Absent

Bursae: Mild right trochanteric bursitis.

Muscles and tendons

Muscles and tendons: Mild gluteus minimus tendinopathy. Hamstring
tendons appear normal and symmetric.

Other findings

Miscellaneous:   No supplemental non-categorized findings.
IMPRESSION: 1. Mild right trochanteric bursitis.
2. Mild gluteus minimus tendinopathy.
3. Mild axial loss of articular cartilage thickness in both hips.
4. Moderate suspicion for an anterior superior labral tear on the
right. No paralabral cyst.

## 2021-02-16 ENCOUNTER — Other Ambulatory Visit: Payer: Self-pay | Admitting: Pharmacist

## 2021-02-16 MED ORDER — RIVAROXABAN 20 MG PO TABS
ORAL_TABLET | ORAL | 0 refills | Status: DC
Start: 2021-02-16 — End: 2021-04-29

## 2021-02-16 NOTE — Telephone Encounter (Signed)
Prescription refill request for Xarelto received.  Indication:afib Last office visit:04/29/20 Weight:84.1 Age:65 Scr:0.67 CrCl:109.6 ml/min

## 2021-03-12 ENCOUNTER — Other Ambulatory Visit: Payer: Self-pay

## 2021-03-12 MED ORDER — METOPROLOL SUCCINATE ER 50 MG PO TB24: ORAL_TABLET | ORAL | 3 refills | Status: DC

## 2021-03-30 ENCOUNTER — Other Ambulatory Visit: Payer: Self-pay | Admitting: Internal Medicine

## 2021-04-12 ENCOUNTER — Encounter (HOSPITAL_BASED_OUTPATIENT_CLINIC_OR_DEPARTMENT_OTHER): Payer: Self-pay | Admitting: *Deleted

## 2021-04-12 ENCOUNTER — Other Ambulatory Visit: Payer: Self-pay

## 2021-04-12 ENCOUNTER — Emergency Department (HOSPITAL_BASED_OUTPATIENT_CLINIC_OR_DEPARTMENT_OTHER): Payer: Medicare HMO

## 2021-04-12 ENCOUNTER — Emergency Department (HOSPITAL_BASED_OUTPATIENT_CLINIC_OR_DEPARTMENT_OTHER)
Admission: EM | Admit: 2021-04-12 | Discharge: 2021-04-12 | Disposition: A | Discharge status: Home / Self Care | Payer: Medicare HMO | Attending: Emergency Medicine | Admitting: Emergency Medicine

## 2021-04-12 DIAGNOSIS — Z79899 Other long term (current) drug therapy: Secondary | ICD-10-CM | POA: Insufficient documentation

## 2021-04-12 DIAGNOSIS — R102 Pelvic and perineal pain: Secondary | ICD-10-CM | POA: Diagnosis not present

## 2021-04-12 DIAGNOSIS — Z7901 Long term (current) use of anticoagulants: Secondary | ICD-10-CM | POA: Insufficient documentation

## 2021-04-12 DIAGNOSIS — K5732 Diverticulitis of large intestine without perforation or abscess without bleeding: Secondary | ICD-10-CM | POA: Diagnosis not present

## 2021-04-12 DIAGNOSIS — I10 Essential (primary) hypertension: Secondary | ICD-10-CM | POA: Diagnosis not present

## 2021-04-12 DIAGNOSIS — Z87891 Personal history of nicotine dependence: Secondary | ICD-10-CM | POA: Insufficient documentation

## 2021-04-12 DIAGNOSIS — R1032 Left lower quadrant pain: Secondary | ICD-10-CM | POA: Diagnosis present

## 2021-04-12 LAB — LIPASE, BLOOD: Lipase: 17 U/L (ref 11–51)

## 2021-04-12 LAB — COMPREHENSIVE METABOLIC PANEL
ALT: 14 U/L (ref 0–44)
AST: 16 U/L (ref 15–41)
Albumin: 4.2 g/dL (ref 3.5–5.0)
Alkaline Phosphatase: 60 U/L (ref 38–126)
Anion gap: 9 (ref 5–15)
BUN: 12 mg/dL (ref 8–23)
CO2: 26 mmol/L (ref 22–32)
Calcium: 9.9 mg/dL (ref 8.9–10.3)
Chloride: 98 mmol/L (ref 98–111)
Creatinine, Ser: 0.71 mg/dL (ref 0.44–1.00)
GFR, Estimated: >60 mL/min (ref >60)
Glucose, Bld: 95 mg/dL (ref 70–99)
Potassium: 4.3 mmol/L (ref 3.5–5.1)
Sodium: 133 mmol/L — ABNORMAL LOW (ref 135–145)
Total Bilirubin: 0.9 mg/dL (ref 0.3–1.2)
Total Protein: 7.5 g/dL (ref 6.5–8.1)

## 2021-04-12 LAB — URINALYSIS, ROUTINE W REFLEX MICROSCOPIC
Bilirubin Urine: NEGATIVE
Glucose, UA: NEGATIVE mg/dL
Ketones, ur: NEGATIVE mg/dL
Nitrite: NEGATIVE
Protein, ur: NEGATIVE mg/dL
Specific Gravity, Urine: 1.009 (ref 1.005–1.030)
pH: 5.5 (ref 5.0–8.0)

## 2021-04-12 LAB — CBC
HCT: 40.5 % (ref 36.0–46.0)
Hemoglobin: 13.6 g/dL (ref 12.0–15.0)
MCH: 30.8 pg (ref 26.0–34.0)
MCHC: 33.6 g/dL (ref 30.0–36.0)
MCV: 91.8 fL (ref 80.0–100.0)
Platelets: 319 10*3/uL (ref 150–400)
RBC: 4.41 MIL/uL (ref 3.87–5.11)
RDW: 12.3 % (ref 11.5–15.5)
WBC: 6.8 10*3/uL (ref 4.0–10.5)
nRBC: 0 % (ref 0.0–0.2)

## 2021-04-12 MED ORDER — CIPROFLOXACIN HCL 500 MG PO TABS: 500.0000 mg | ORAL_TABLET | Freq: Two times a day (BID) | ORAL | 0 refills | Status: DC

## 2021-04-12 MED ORDER — METRONIDAZOLE 500 MG PO TABS: 500.0000 mg | ORAL_TABLET | Freq: Two times a day (BID) | ORAL | 0 refills | Status: DC

## 2021-04-12 MED ORDER — CIPROFLOXACIN HCL 500 MG PO TABS
500.0000 mg | ORAL_TABLET | Freq: Once | ORAL | Status: AC
  Administered 2021-04-12: 500 mg via ORAL
  Filled 2021-04-12: qty 1

## 2021-04-12 MED ORDER — METRONIDAZOLE 500 MG PO TABS
500.0000 mg | ORAL_TABLET | Freq: Once | ORAL | Status: AC
  Administered 2021-04-12: 500 mg via ORAL
  Filled 2021-04-12: qty 1

## 2021-04-12 MED ORDER — IOHEXOL 300 MG/ML  SOLN
80.0000 mL | Freq: Once | INTRAMUSCULAR | Status: AC | PRN
  Administered 2021-04-12: 80 mL via INTRAVENOUS

## 2021-04-12 NOTE — ED Provider Notes (Signed)
[facility] EMERGENCY DEPT Provider Note   CSN: [id number] Arrival date & time: 04/12/21  1240     History Chief Complaint  Patient presents with   Abdominal Pain    Stephanie [name] is a 65 y.o. female.  Pt presents to the ED today with abdominal pain.  Pt has a hx of diverticulitis and this feels similar.  The pt said she has had pain for about 1 week.  She had some nausea this am associated with the pain.  She has been able to eat.  No f/c.       Past Medical History:  Diagnosis Date   Diverticulitis 06/2016   Hyperlipidemia    Hypertension    Osteoporosis    Paroxysmal atrial fibrillation ([place]) 01/26/2016    Patient Active Problem List   Diagnosis Date Noted   Anxiety 12/19/2018   Paroxysmal atrial fibrillation ([place]) 01/26/2016   Hyperlipidemia 09/01/2009   TOBACCO ABUSE 09/01/2009   Essential hypertension 09/01/2009    History reviewed. No pertinent surgical history.   OB History   No obstetric history on file.     Family History  Problem Relation Age of Onset   Heart disease Father 67       CAD   Sudden death Other        family hx   Alzheimer's disease Mother    Heart disease Brother 80       CAD    Social History   Tobacco Use   Smoking status: Former    Packs/day: 1.00    Years: 30.00    Pack years: 30.00    Types: Cigarettes    Quit date: 05/31/2010    Years since quitting: 10.8   Smokeless tobacco: Never  Vaping Use   Vaping Use: Never used  Substance Use Topics   Alcohol use: Yes    Alcohol/week: 2.0 standard drinks    Types: 2 Cans of beer per week    Comment: occ   Drug use: No    Home Medications Prior to Admission medications   Medication Sig Start Date End Date Taking? Authorizing Provider  ciprofloxacin (CIPRO) 500 MG tablet Take 1 tablet (500 mg total) by mouth 2 (two) times daily. 04/12/21  Yes Isla Pence, MD  diltiazem (CARDIZEM CD) 180 MG 24 hr capsule TAKE 1 CAPSULE BY MOUTH EVERY DAY 12/01/20  Yes  Burchette, Alinda Sierras, MD  FLUoxetine (PROZAC) 20 MG capsule TAKE 1 CAPSULE BY MOUTH EVERY DAY 12/24/20  Yes Burchette, Alinda Sierras, MD  metroNIDAZOLE (FLAGYL) 500 MG tablet Take 1 tablet (500 mg total) by mouth 2 (two) times daily. 04/12/21  Yes Isla Pence, MD  rivaroxaban (XARELTO) 20 MG TABS tablet TAKE 1 TABLET (20 MG TOTAL) BY MOUTH DAILY WITH SUPPER. 02/16/21  Yes Deboraha Sprang, MD  rosuvastatin (CRESTOR) 40 MG tablet Take 1 tablet (40 mg total) by mouth daily. 12/24/20  Yes Burchette, Alinda Sierras, MD  valsartan (DIOVAN) 80 MG tablet TAKE 1 TABLET BY MOUTH EVERY DAY 12/24/20  Yes Burchette, Alinda Sierras, MD  alendronate (FOSAMAX) 70 MG tablet Take 70 mg by mouth once a week. 11/25/20   [provider]  ALPRAZolam Duanne Moron) 0.5 MG tablet Take 1 tablet (0.5 mg total) by mouth 3 (three) times daily as needed for anxiety. 01/18/20   Burchette, Alinda Sierras, MD  ezetimibe (ZETIA) 10 MG tablet Take 1 tablet (10 mg total) by mouth daily. Patient not taking: Reported on 11/28/2020 08/14/20   Eulas Post,  MD  metoprolol succinate (TOPROL-XL) 50 MG 24 hr tablet Take 1/2 tablet by mouth if needed for breakthrough afib for HR >100 as long as BP > 100 03/12/21   Baldwin Jamaica, PA-C    Allergies    Patient has no known allergies.  Review of Systems   Review of Systems  Gastrointestinal:  Positive for abdominal pain and nausea.  All other systems reviewed and are negative.  Physical Exam Updated Vital Signs BP (!) 187/88 (BP Location: Right Arm)   Pulse 68   Temp 98.7 F (37.1 C) (Oral)   Resp 15   Ht 5\' 7"  (1.702 m)   Wt 81.6 kg   SpO2 97%   BMI 28.19 kg/m   Physical Exam Vitals and nursing note reviewed.  Constitutional:      Appearance: She is well-developed.  HENT:     Head: Normocephalic and atraumatic.     Mouth/Throat:     Mouth: Mucous membranes are moist.     Pharynx: Oropharynx is clear.  Eyes:     Extraocular Movements: Extraocular movements intact.     Pupils: Pupils are equal,  round, and reactive to light.  Cardiovascular:     Rate and Rhythm: Normal rate and regular rhythm.  Pulmonary:     Effort: Pulmonary effort is normal.     Breath sounds: Normal breath sounds.  Abdominal:     General: Abdomen is flat. Bowel sounds are normal.     Palpations: Abdomen is soft.     Tenderness: There is abdominal tenderness in the left lower quadrant.  Skin:    General: Skin is warm.     Capillary Refill: Capillary refill takes less than 2 seconds.  Neurological:     General: No focal deficit present.     Mental Status: She is alert and oriented to person, place, and time.  Psychiatric:        Mood and Affect: Mood normal.        Behavior: Behavior normal.    ED Results / Procedures / Treatments   Labs (all labs ordered are listed, but only abnormal results are displayed) Labs Reviewed  COMPREHENSIVE METABOLIC PANEL - Abnormal; Notable for the following components:      Result Value   Sodium 133 (*)    All other components within normal limits  URINALYSIS, ROUTINE W REFLEX MICROSCOPIC - Abnormal; Notable for the following components:   Color, Urine COLORLESS (*)    Hgb urine dipstick TRACE (*)    Leukocytes,Ua TRACE (*)    Bacteria, UA RARE (*)    All other components within normal limits  LIPASE, BLOOD  CBC    EKG None  Radiology CT ABDOMEN PELVIS W CONTRAST  Result Date: 04/12/2021 CLINICAL DATA:  65 year old female with abdominal and pelvic pain. EXAM: CT ABDOMEN AND PELVIS WITH CONTRAST TECHNIQUE: Multidetector CT imaging of the abdomen and pelvis was performed using the standard protocol following bolus administration of intravenous contrast. CONTRAST:  49mL OMNIPAQUE IOHEXOL 300 MG/ML  SOLN COMPARISON:  None. FINDINGS: Lower chest: No acute abnormality. Hepatobiliary: The liver and gallbladder are unremarkable. No biliary dilatation. Pancreas: Unremarkable Spleen: Unremarkable Adrenals/Urinary Tract: The kidneys, adrenal glands and bladder are  unremarkable. Stomach/Bowel: There is inflammation adjacent to the proximal sigmoid colon likely representing acute diverticulitis. There is no evidence of pneumoperitoneum or abscess. Diverticulosis within the remainder of the descending sigmoid colon noted. A small hiatal hernia is noted. There is no evidence of bowel obstruction, other bowel wall  thickening or inflammatory changes. The appendix is normal. Vascular/Lymphatic: Aortic atherosclerosis. No enlarged abdominal or pelvic lymph nodes. Reproductive: Uterus and bilateral adnexa are unremarkable. Other: No ascites. Musculoskeletal: No acute or suspicious bony abnormalities are identified. Mild multilevel degenerative disc disease/spondylosis within the lumbar spine noted. IMPRESSION: 1. Inflammation adjacent to the proximal sigmoid colon likely representing acute diverticulitis. No pneumoperitoneum or abscess. Clinical follow-up recommended. 2. Small hiatal hernia. 3. Aortic Atherosclerosis (ICD10-I70.0). Electronically Signed   By: Margarette Canada M.D.   On: 04/12/2021 14:17    Procedures Procedures   Medications Ordered in ED Medications  ciprofloxacin (CIPRO) tablet 500 mg (has no administration in time range)  metroNIDAZOLE (FLAGYL) tablet 500 mg (has no administration in time range)  iohexol (OMNIPAQUE) 300 MG/ML solution 80 mL (80 mLs Intravenous Contrast Given 04/12/21 1356)    ED Course  I have reviewed the triage vital signs and the nursing notes.  Pertinent labs & imaging results that were available during my care of the patient were reviewed by me and considered in my medical decision making (see chart for details).    MDM Rules/Calculators/A&P                         Pt did not want anything for pain or nausea.  She does have diverticulitis and will be treated with cipro/flagyl.  She is to return if worse.  F/u with pcp.  Final Clinical Impression(s) / ED Diagnoses Final diagnoses:  Diverticulitis of sigmoid colon    Rx / DC  Orders ED Discharge Orders          Ordered    ciprofloxacin (CIPRO) 500 MG tablet  2 times daily        04/12/21 1424    metroNIDAZOLE (FLAGYL) 500 MG tablet  2 times daily        04/12/21 1424             Isla Pence, MD 04/12/21 1425

## 2021-04-12 NOTE — ED Triage Notes (Signed)
Pt has a history of Diverticulitis. Pain on and off for over a week, some nausea not vomiting or diarrhea.

## 2021-04-29 ENCOUNTER — Other Ambulatory Visit: Payer: Self-pay | Admitting: Internal Medicine

## 2021-04-29 NOTE — Telephone Encounter (Addendum)
Xarelto 20mg  refill request received. Pt is 65 years old, weight-81.6kg, Crea-0.71 on 04/12/2021, last seen by Tommye Standard on 04/29/2020-NEEDS APPT, Diagnosis-Afib, CrCl-100.73ml/min; Dose is appropriate based on dosing criteria.   Pt needs an appt with Cardiologist. Called pt to let her know that she needs an appt with Cardiologist and she verbalized understanding. Transferred her to the main line to make an appt.  Pt was successful with making an appt with Jonni Sanger, [place] on 05/19/2021; will send in refill for pt.

## 2021-05-18 NOTE — Progress Notes (Signed)
PCP:  Eulas Post, MD Primary Cardiologist: None Electrophysiologist: Virl Axe, MD   Stephanie [name] is a 65 y.o. female seen today for Virl Axe, MD for routine electrophysiology followup.  Since last being seen in our clinic the patient reports doing well overall. She does not feel like she has had any breakthrough AF. she denies chest pain, palpitations, dyspnea, PND, orthopnea, nausea, vomiting, dizziness, syncope, edema, weight gain, or early satiety.  Past Medical History:  Diagnosis Date   Diverticulitis 06/2016   Hyperlipidemia    Hypertension    Osteoporosis    Paroxysmal atrial fibrillation ([place]) 01/26/2016   No past surgical history on file.  Current Outpatient Medications  Medication Sig Dispense Refill   alendronate (FOSAMAX) 70 MG tablet Take 70 mg by mouth once a week.     ALPRAZolam (XANAX) 0.5 MG tablet Take 1 tablet (0.5 mg total) by mouth 3 (three) times daily as needed for anxiety. 30 tablet 0   diltiazem (CARDIZEM CD) 180 MG 24 hr capsule TAKE 1 CAPSULE BY MOUTH EVERY DAY 90 capsule 3   FLUoxetine (PROZAC) 20 MG capsule TAKE 1 CAPSULE BY MOUTH EVERY DAY 90 capsule 3   metoprolol succinate (TOPROL-XL) 50 MG 24 hr tablet Take 1/2 tablet by mouth if needed for breakthrough afib for HR >100 as long as BP > 100 30 tablet 3   rosuvastatin (CRESTOR) 40 MG tablet Take 1 tablet (40 mg total) by mouth daily. 90 tablet 3   valsartan (DIOVAN) 80 MG tablet TAKE 1 TABLET BY MOUTH EVERY DAY 90 tablet 3   XARELTO 20 MG TABS tablet TAKE 1 TABLET BY MOUTH DAILY WITH SUPPER. 90 tablet 0   Current Facility-Administered Medications  Medication Dose Route Frequency Provider Last Rate Last Admin   0.9 %  sodium chloride infusion  500 mL Intravenous Continuous Nandigam, Venia Minks, MD        No Known Allergies  Social History   Socioeconomic History   Marital status: Married    Spouse name: Not on file   Number of children: 2   Years of education: Not on file    Highest education level: Not on file  Occupational History   Not on file  Tobacco Use   Smoking status: Former    Packs/day: 1.00    Years: 30.00    Pack years: 30.00    Types: Cigarettes    Quit date: 05/31/2010    Years since quitting: 10.9   Smokeless tobacco: Never  Vaping Use   Vaping Use: Never used  Substance and Sexual Activity   Alcohol use: Yes    Alcohol/week: 2.0 standard drinks    Types: 2 Cans of beer per week    Comment: occ   Drug use: No   Sexual activity: Not on file  Other Topics Concern   Not on file  Social History Narrative   Not on file   Social Determinants of Health   Financial Resource Strain: Low Risk    Difficulty of Paying Living Expenses: Not hard at all  Food Insecurity: No Food Insecurity   Worried About Charity fundraiser in the Last Year: Never true   Cumings in the Last Year: Never true  Transportation Needs: No Transportation Needs   Lack of Transportation (Medical): No   Lack of Transportation (Non-Medical): No  Physical Activity: Inactive   Days of Exercise per Week: 0 days   Minutes of Exercise per Session: 0 min  Stress: No Stress Concern Present   Feeling of Stress : Not at all  Social Connections: Moderately Isolated   Frequency of Communication with Friends and Family: Twice a week   Frequency of Social Gatherings with Friends and Family: Once a week   Attends Religious Services: Never   Marine scientist or Organizations: No   Attends Music therapist: Never   Marital Status: Married  Human resources officer Violence: Not At Risk   Fear of Current or Ex-Partner: No   Emotionally Abused: No   Physically Abused: No   Sexually Abused: No     Review of Systems: General: No chills, fever, night sweats or weight changes  Cardiovascular:  No chest pain, dyspnea on exertion, edema, orthopnea, palpitations, paroxysmal nocturnal dyspnea Dermatological: No rash, lesions or masses Respiratory: No cough,  dyspnea Urologic: No hematuria, dysuria Abdominal: No nausea, vomiting, diarrhea, bright red blood per rectum, melena, or hematemesis Neurologic: No visual changes, weakness, changes in mental status All other systems reviewed and are otherwise negative except as noted above.  Physical Exam: Vitals:   05/19/21 1108  BP: (!) 152/68  Pulse: (!) 58  SpO2: 96%  Weight: 186 lb (84.4 kg)  Height: '5\' 7"'$  (1.702 m)    GEN- The patient is well appearing, alert and oriented x 3 today.   HEENT: normocephalic, atraumatic; sclera clear, conjunctiva pink; hearing intact; oropharynx clear; neck supple, no JVP Lymph- no cervical lymphadenopathy Lungs- Clear to ausculation bilaterally, normal work of breathing.  No wheezes, rales, rhonchi Heart- Regular rate and rhythm, no murmurs, rubs or gallops, PMI not laterally displaced GI- soft, non-tender, non-distended, bowel sounds present, no hepatosplenomegaly Extremities- no clubbing, cyanosis, or edema; DP/PT/radial pulses 2+ bilaterally MS- no significant deformity or atrophy Skin- warm and dry, no rash or lesion Psych- euthymic mood, full affect Neuro- strength and sensation are intact  EKG is ordered. Personal review of EKG from today shows sinus bradycardia at  58 bpm, stable intervals.  Additional studies reviewed include: Previous EP office notes.   Assessment and Plan:  1. Paroxysmal AFib Continue Xarelto for CHA2DS2-VASc of at least 3.   Have previously discussed AFib management strategies She snores heavily, has been recommended to have sleep study in the past though was poorly covered by her insurance and too expensive.  Discussed exercise and weight management   She would prefer not to follow up in the AFib clinc. Labs stable 04/12/21.    2. HTN Stable    3. Syncope Denies recurrence    4. HLD Per PCP.   [name], PA-C  05/19/21 11:20 AM

## 2021-05-19 ENCOUNTER — Encounter: Payer: Self-pay | Admitting: Student

## 2021-05-19 ENCOUNTER — Other Ambulatory Visit: Payer: Self-pay

## 2021-05-19 ENCOUNTER — Ambulatory Visit: Payer: Medicare HMO | Admitting: Student

## 2021-05-19 VITALS — BP 152/68 | HR 58 | Ht 67.0 in | Wt 186.0 lb

## 2021-05-19 DIAGNOSIS — I10 Essential (primary) hypertension: Secondary | ICD-10-CM | POA: Diagnosis not present

## 2021-05-19 DIAGNOSIS — R55 Syncope and collapse: Secondary | ICD-10-CM

## 2021-05-19 DIAGNOSIS — E785 Hyperlipidemia, unspecified: Secondary | ICD-10-CM

## 2021-05-19 DIAGNOSIS — I480 Paroxysmal atrial fibrillation: Secondary | ICD-10-CM

## 2021-05-19 MED ORDER — RIVAROXABAN 20 MG PO TABS: 20.0000 mg | ORAL_TABLET | Freq: Every day | ORAL | 3 refills | Status: DC

## 2021-05-19 NOTE — Patient Instructions (Signed)
Medication Instructions:  Your physician recommends that you continue on your current medications as directed. Please refer to the Current Medication list given to you today.  *If you need a refill on your cardiac medications before your next appointment, please call your pharmacy*   Lab Work: None If you have labs (blood work) drawn today and your tests are completely normal, you will receive your results only by: MyChart Message (if you have MyChart) OR A paper copy in the mail If you have any lab test that is abnormal or we need to change your treatment, we will call you to review the results.   Follow-Up: At [facility], you and your health needs are our priority.  As part of our continuing mission to provide you with exceptional heart care, we have created designated Provider Care Teams.  These Care Teams include your primary Cardiologist (physician) and Advanced Practice Providers (APPs -  Physician Assistants and Nurse Practitioners) who all work together to provide you with the care you need, when you need it.  Your next appointment:   6 month(s)  The format for your next appointment:   In Person  Provider:   You may see Steven Klein, MD or one of the following Advanced Practice Providers on your designated Care Team:   Renee Ursuy, PA-C Michael "Andy" Tillery, PA-C    

## 2021-06-27 ENCOUNTER — Other Ambulatory Visit: Payer: Self-pay | Admitting: Internal Medicine

## 2021-06-29 NOTE — Telephone Encounter (Signed)
Prescription refill request for Xarelto received.  Indication: afib Last office visit: Tillery, 05/19/2021 Weight: 84.4 kg  Age: 65 yo  Scr: 0.71, 04/12/2021 CrCl: 103 ml/min  Refill sent.

## 2021-07-13 ENCOUNTER — Encounter: Payer: Self-pay | Admitting: Family Medicine

## 2021-07-13 NOTE — Telephone Encounter (Signed)
Please advise 

## 2021-07-15 ENCOUNTER — Telehealth (INDEPENDENT_AMBULATORY_CARE_PROVIDER_SITE_OTHER): Payer: Medicare HMO | Admitting: Adult Health

## 2021-07-15 ENCOUNTER — Encounter: Payer: Self-pay | Admitting: Adult Health

## 2021-07-15 VITALS — Temp 98.6°F | Ht 67.0 in | Wt 184.0 lb

## 2021-07-15 DIAGNOSIS — R051 Acute cough: Secondary | ICD-10-CM

## 2021-07-15 DIAGNOSIS — J988 Other specified respiratory disorders: Secondary | ICD-10-CM

## 2021-07-15 MED ORDER — HYDROCODONE BIT-HOMATROP MBR 5-1.5 MG/5ML PO SOLN: 5.0000 mL | Freq: Three times a day (TID) | ORAL | 0 refills | Status: DC | PRN

## 2021-07-15 MED ORDER — DOXYCYCLINE HYCLATE 100 MG PO CAPS: 100.0000 mg | ORAL_CAPSULE | Freq: Two times a day (BID) | ORAL | 0 refills | Status: DC

## 2021-07-15 NOTE — Progress Notes (Signed)
Virtual Visit via Video Note  I connected with Stephanie [name] on 07/15/21 at 10:30 AM EDT by a video enabled telemedicine application and verified that I am speaking with the correct person using two identifiers.  Location patient: home Location provider:work or home office Persons participating in the virtual visit: patient, provider  I discussed the limitations of evaluation and management by telemedicine and the availability of in person appointments. The patient expressed understanding and agreed to proceed.   HPI: 66 year old female who is being evaluated today for an acute issue.  Her symptoms started roughly 5 to 6 days ago.  Symptoms include a similar productive cough, body aches, nasal congestion, headaches, nausea and vomiting.  When she woke up this morning she was feeling a little bit better and has been able to keep some p.o. intake down.  She also reports issues with mild shortness of breath and wheezing.  Denies sinus pain or pressure, fever, or chills.  Cough is keeping her up at night.  He has taken 3 COVID tests at home all of which have been negative.  Using Mucinex and Tylenol which seems to be helping   ROS: See pertinent positives and negatives per HPI.  Past Medical History:  Diagnosis Date   Diverticulitis 06/2016   Hyperlipidemia    Hypertension    Osteoporosis    Paroxysmal atrial fibrillation ([place]) 01/26/2016    History reviewed. No pertinent surgical history.  Family History  Problem Relation Age of Onset   Heart disease Father 17       CAD   Sudden death Other        family hx   Alzheimer's disease Mother    Heart disease Brother 36       CAD       Current Outpatient Medications:    alendronate (FOSAMAX) 70 MG tablet, Take 70 mg by mouth once a week., Disp: , Rfl:    ALPRAZolam (XANAX) 0.5 MG tablet, Take 1 tablet (0.5 mg total) by mouth 3 (three) times daily as needed for anxiety., Disp: 30 tablet, Rfl: 0   diltiazem (CARDIZEM CD) 180 MG 24 hr  capsule, TAKE 1 CAPSULE BY MOUTH EVERY DAY, Disp: 90 capsule, Rfl: 3   FLUoxetine (PROZAC) 20 MG capsule, TAKE 1 CAPSULE BY MOUTH EVERY DAY, Disp: 90 capsule, Rfl: 3   metoprolol succinate (TOPROL-XL) 50 MG 24 hr tablet, Take 1/2 tablet by mouth if needed for breakthrough afib for HR >100 as long as BP > 100, Disp: 30 tablet, Rfl: 3   rivaroxaban (XARELTO) 20 MG TABS tablet, TAKE 1 TABLET BY MOUTH EVERY DAY WITH SUPPER, Disp: 90 tablet, Rfl: 1   rosuvastatin (CRESTOR) 40 MG tablet, Take 1 tablet (40 mg total) by mouth daily., Disp: 90 tablet, Rfl: 3   valsartan (DIOVAN) 80 MG tablet, TAKE 1 TABLET BY MOUTH EVERY DAY, Disp: 90 tablet, Rfl: 3  Current Facility-Administered Medications:    0.9 %  sodium chloride infusion, 500 mL, Intravenous, Continuous, Nandigam, Kavitha V, MD  EXAM:  VITALS per patient if applicable:  GENERAL: alert, oriented, appears well and in no acute distress  HEENT: atraumatic, conjunttiva clear, no obvious abnormalities on inspection of external nose and ears  NECK: normal movements of the head and neck  LUNGS: on inspection no signs of respiratory distress, breathing rate appears normal, no obvious gross SOB, gasping or wheezing  CV: no obvious cyanosis  MS: moves all visible extremities without noticeable abnormality  PSYCH/NEURO: pleasant and cooperative, no obvious  depression or anxiety, speech and thought processing grossly intact  ASSESSMENT AND PLAN:  Discussed the following assessment and plan:  1. Respiratory infection -She is improving, likely viral.  We will send in doxycycline and advised if she is not improving over the next 2 days then she can start medication at that time. - doxycycline (VIBRAMYCIN) 100 MG capsule; Take 1 capsule (100 mg total) by mouth 2 (two) times daily.  Dispense: 14 capsule; Refill: 0 - Follow up as needed  2. Acute cough  - HYDROcodone bit-homatropine (HYCODAN) 5-1.5 MG/5ML syrup; Take 5 mLs by mouth every 8 (eight)  hours as needed for cough.  Dispense: 120 mL; Refill: 0      I discussed the assessment and treatment plan with the patient. The patient was provided an opportunity to ask questions and all were answered. The patient agreed with the plan and demonstrated an understanding of the instructions.   The patient was advised to call back or seek an in-person evaluation if the symptoms worsen or if the condition fails to improve as anticipated.   Dorothyann Peng, NP

## 2021-08-27 DIAGNOSIS — Z1231 Encounter for screening mammogram for malignant neoplasm of breast: Secondary | ICD-10-CM | POA: Diagnosis not present

## 2021-08-27 DIAGNOSIS — Z6828 Body mass index (BMI) 28.0-28.9, adult: Secondary | ICD-10-CM | POA: Diagnosis not present

## 2021-08-27 DIAGNOSIS — Z01411 Encounter for gynecological examination (general) (routine) with abnormal findings: Secondary | ICD-10-CM | POA: Diagnosis not present

## 2021-08-27 DIAGNOSIS — Z124 Encounter for screening for malignant neoplasm of cervix: Secondary | ICD-10-CM | POA: Diagnosis not present

## 2021-08-27 DIAGNOSIS — M8580 Other specified disorders of bone density and structure, unspecified site: Secondary | ICD-10-CM | POA: Diagnosis not present

## 2021-08-27 DIAGNOSIS — Z01419 Encounter for gynecological examination (general) (routine) without abnormal findings: Secondary | ICD-10-CM | POA: Diagnosis not present

## 2021-09-30 DIAGNOSIS — Z0100 Encounter for examination of eyes and vision without abnormal findings: Secondary | ICD-10-CM | POA: Diagnosis not present

## 2021-09-30 DIAGNOSIS — H524 Presbyopia: Secondary | ICD-10-CM | POA: Diagnosis not present

## 2021-11-05 ENCOUNTER — Ambulatory Visit (INDEPENDENT_AMBULATORY_CARE_PROVIDER_SITE_OTHER): Payer: Medicare HMO

## 2021-11-05 VITALS — Ht 67.0 in | Wt 186.0 lb

## 2021-11-05 DIAGNOSIS — Z0000 Encounter for general adult medical examination without abnormal findings: Secondary | ICD-10-CM | POA: Diagnosis not present

## 2021-11-05 NOTE — Progress Notes (Signed)
Subjective:   Stephanie [name] is a 66 y.o. female who presents for Medicare Annual (Subsequent) preventive examination.  Review of Systems    No ROS      Objective:    Today's Vitals   11/05/21 1126  Weight: 186 lb (84.4 kg)  Height: 5\' 7"  (1.702 m)   Body mass index is 29.13 kg/m.  Advanced Directives 11/05/2021 04/12/2021 11/28/2020 04/08/2020 01/10/2019 05/06/2018 09/10/2016  Does Patient Have a Medical Advance Directive? No No No No No No No  Would patient like information on creating a medical advance directive? No - Patient declined - Yes (MAU/Ambulatory/Procedural Areas - Information given) - - - -    Current Medications (verified) Outpatient Encounter Medications as of 11/05/2021  Medication Sig   alendronate (FOSAMAX) 70 MG tablet Take 70 mg by mouth once a week.   ALPRAZolam (XANAX) 0.5 MG tablet Take 1 tablet (0.5 mg total) by mouth 3 (three) times daily as needed for anxiety.   diltiazem (CARDIZEM CD) 180 MG 24 hr capsule TAKE 1 CAPSULE BY MOUTH EVERY DAY   doxycycline (VIBRAMYCIN) 100 MG capsule Take 1 capsule (100 mg total) by mouth 2 (two) times daily.   FLUoxetine (PROZAC) 20 MG capsule TAKE 1 CAPSULE BY MOUTH EVERY DAY   HYDROcodone bit-homatropine (HYCODAN) 5-1.5 MG/5ML syrup Take 5 mLs by mouth every 8 (eight) hours as needed for cough.   metoprolol succinate (TOPROL-XL) 50 MG 24 hr tablet Take 1/2 tablet by mouth if needed for breakthrough afib for HR >100 as long as BP > 100   rivaroxaban (XARELTO) 20 MG TABS tablet TAKE 1 TABLET BY MOUTH EVERY DAY WITH SUPPER   rosuvastatin (CRESTOR) 40 MG tablet Take 1 tablet (40 mg total) by mouth daily.   valsartan (DIOVAN) 80 MG tablet TAKE 1 TABLET BY MOUTH EVERY DAY   Facility-Administered Encounter Medications as of 11/05/2021  Medication   0.9 %  sodium chloride infusion    Allergies (verified) Patient has no known allergies.   History: Past Medical History:  Diagnosis Date   Diverticulitis 06/2016   Hyperlipidemia     Hypertension    Osteoporosis    Paroxysmal atrial fibrillation ([place]) 01/26/2016   History reviewed. No pertinent surgical history. Family History  Problem Relation Age of Onset   Heart disease Father 65       CAD   Sudden death Other        family hx   Alzheimer's disease Mother    Heart disease Brother 18       CAD   Social History   Socioeconomic History   Marital status: Married    Spouse name: Not on file   Number of children: 2   Years of education: Not on file   Highest education level: Not on file  Occupational History   Not on file  Tobacco Use   Smoking status: Former    Packs/day: 1.00    Years: 30.00    Pack years: 30.00    Types: Cigarettes    Quit date: 05/31/2010    Years since quitting: 11.4   Smokeless tobacco: Never  Vaping Use   Vaping Use: Never used  Substance and Sexual Activity   Alcohol use: Yes    Alcohol/week: 2.0 standard drinks    Types: 2 Cans of beer per week    Comment: occ   Drug use: No   Sexual activity: Not on file  Other Topics Concern   Not on file  Social  History Narrative   Not on file   Social Determinants of Health   Financial Resource Strain: Low Risk    Difficulty of Paying Living Expenses: Not hard at all  Food Insecurity: No Food Insecurity   Worried About Charity fundraiser in the Last Year: Never true   [place] in the Last Year: Never true  Transportation Needs: No Transportation Needs   Lack of Transportation (Medical): No   Lack of Transportation (Non-Medical): No  Physical Activity: Insufficiently Active   Days of Exercise per Week: 1 day   Minutes of Exercise per Session: 10 min  Stress: No Stress Concern Present   Feeling of Stress : Not at all  Social Connections: Moderately Integrated   Frequency of Communication with Friends and Family: More than three times a week   Frequency of Social Gatherings with Friends and Family: More than three times a week   Attends Religious Services: More  than 4 times per year   Active Member of [employer] or Organizations: No   Attends Archivist Meetings: Never   Marital Status: Married    Clinical Intake:  Diabetic? No  Activities of Daily Living In your present state of health, do you have any difficulty performing the following activities: 11/05/2021 11/28/2020  Hearing? N Y  Vision? N N  Difficulty concentrating or making decisions? N N  Walking or climbing stairs? N N  Dressing or bathing? N N  Doing errands, shopping? - Scientist, forensic and eating ? N N  Using the Toilet? N N  In the past six months, have you accidently leaked urine? N Y  Comment - has occassional stress incontinence  Do you have problems with loss of bowel control? N N  Managing your Medications? N N  Managing your Finances? N N  Housekeeping or managing your Housekeeping? N N  Some recent data might be hidden    Patient Care Team: Eulas Post, MD as PCP - General Deboraha Sprang, MD as PCP - Electrophysiology (Cardiology)  Indicate any recent Medical Services you may have received from other than [facility] providers in the past year (date may be approximate).     Assessment:   This is a routine wellness examination for Stephanie [name]. Virtual Visit via Telephone Note  I connected with  Stephanie [name] on 11/05/21 at 11:15 AM EST by telephone and verified that I am speaking with the correct person using two identifiers.  Location: Patient: Home Provider: Office Persons participating in the virtual visit: patient/Nurse Health Advisor   I discussed the limitations, risks, security and privacy concerns of performing an evaluation and management service by telephone and the availability of in person appointments. The patient expressed understanding and agreed to proceed.  Interactive audio and video telecommunications were attempted between this nurse and patient, however failed, due to patient having technical difficulties OR patient did not have  access to video capability.  We continued and completed visit with audio only.  Some vital signs may be absent or patient reported.   Criselda Peaches, LPN   Hearing/Vision screen Hearing Screening - Comments:: No difficulty hearing Vision Screening - Comments:: Wears reading glasses. Followed by [facility]  Dietary issues and exercise activities discussed: Current Exercise Habits: Home exercise routine, Type of exercise: treadmill, Time (Minutes): 10, Frequency (Times/Week): 2, Weekly Exercise (Minutes/Week): 20, Intensity: Mild   Goals Addressed             This  Visit's Progress    Patient Stated       I would like to start walking more.        Depression Screen PHQ 2/9 Scores 11/05/2021 11/28/2020 01/18/2020  PHQ - 2 Score 0 0 0    Fall Risk Fall Risk  11/05/2021 11/28/2020  Falls in the past year? 0 0  Number falls in past yr: 0 0  Injury with Fall? 0 0  Risk for fall due to : - No Fall Risks  Follow up - Falls evaluation completed;Falls prevention discussed    FALL RISK PREVENTION PERTAINING TO THE HOME:  Any stairs in or around the home? Yes  If so, are there any without handrails? No  Home free of loose throw rugs in walkways, pet beds, electrical cords, etc? Yes  Adequate lighting in your home to reduce risk of falls? Yes  ASSISTIVE DEVICES UTILIZED TO PREVENT FALLS:  Life alert? No  Use of a cane, walker or w/c? No  Grab bars in the bathroom? No  Shower chair or bench in shower? Yes  Elevated toilet seat or a handicapped toilet? No   TIMED UP AND GO:  Was the test performed? No . Audio Visit  Cognitive Function:    Immunizations Immunization History  Administered Date(s) Administered   Fluad Quad(high Dose 65+) 08/12/2020   Influenza Split 06/28/2012, 07/13/2013, 06/27/2014, 06/18/2015   Influenza, High Dose Seasonal PF 07/12/2021   Influenza, Quadrivalent, Recombinant, Inj, Pf 08/06/2019   Influenza,inj,Quad PF,6+ Mos 07/07/2017, 07/05/2018    Influenza-Unspecified 07/04/2016, 07/07/2017, 07/05/2018   Moderna Covid-19 Vaccine Bivalent Booster 51yrs & up 09/29/2021   Moderna Sars-Covid-2 Vaccination 11/22/2019, 12/21/2019, 08/21/2020, 04/23/2021   Pneumococcal Polysaccharide-23 09/01/2009   Td 12/11/2009   Zoster, Live 04/21/2015    TDAP status: Due, Education has been provided regarding the importance of this vaccine. Advised may receive this vaccine at local pharmacy or Health Dept. Aware to provide a copy of the vaccination record if obtained from local pharmacy or Health Dept. Verbalized acceptance and understanding.  Flu Vaccine status: Up to date  Pneumococcal vaccine status: Due, Education has been provided regarding the importance of this vaccine. Advised may receive this vaccine at local pharmacy or Health Dept. Aware to provide a copy of the vaccination record if obtained from local pharmacy or Health Dept. Verbalized acceptance and understanding.  Covid-19 vaccine status: Completed vaccines  Qualifies for Shingles Vaccine? Yes   Zostavax completed No   Shingrix Completed?: No.    Education has been provided regarding the importance of this vaccine. Patient has been advised to call insurance company to determine out of pocket expense if they have not yet received this vaccine. Advised may also receive vaccine at local pharmacy or Health Dept. Verbalized acceptance and understanding.  Screening Tests Health Maintenance  Topic Date Due   Zoster Vaccines- Shingrix (1 of 2) 02/03/2022 (Originally 09/25/2004)   Pneumonia Vaccine 33+ Years old (2 - PCV) 11/05/2022 (Originally 09/01/2010)   TETANUS/TDAP  11/05/2022 (Originally 12/12/2019)   MAMMOGRAM  07/21/2022   COLONOSCOPY (Pts 45-39yrs Insurance coverage will need to be confirmed)  09/10/2026   INFLUENZA VACCINE  Completed   DEXA SCAN  Completed   COVID-19 Vaccine  Completed   Hepatitis C Screening  Completed   HPV VACCINES  Aged Out    Health  Maintenance   Additional Screening:  Vision Screening: Recommended annual ophthalmology exams for early detection of glaucoma and other disorders of the eye. Is the patient up to date  with their annual eye exam?  Yes  Who is the provider or what is the name of the office in which the patient attends annual eye exams? Followed by [facility]  Dental Screening: Recommended annual dental exams for proper oral hygiene  Community Resource Referral / Chronic Care Management:  CRR required this visit?  No   CCM required this visit?  No      Plan:     I have personally reviewed and noted the following in the patients chart:   Medical and social history Use of alcohol, tobacco or illicit drugs  Current medications and supplements including opioid prescriptions. Patient currently taking opioids. Functional ability and status Nutritional status Physical activity Advanced directives List of other physicians Hospitalizations, surgeries, and ER visits in previous 12 months Vitals Screenings to include cognitive, depression, and falls Referrals and appointments  In addition, I have reviewed and discussed with patient certain preventive protocols, quality metrics, and best practice recommendations. A written personalized care plan for preventive services as well as general preventive health recommendations were provided to patient.     Criselda Peaches, LPN   05/24/4817

## 2021-11-05 NOTE — Patient Instructions (Addendum)
Stephanie [name] , Thank you for taking time to come for your Medicare Wellness Visit. I appreciate your ongoing commitment to your health goals. Please review the following plan we discussed and let me know if I can assist you in the future.   These are the goals we discussed:  Goals      Patient Stated     I would like to start walking more.         This is a list of the screening recommended for you and due dates:  Health Maintenance  Topic Date Due   Zoster (Shingles) Vaccine (1 of 2) 02/03/2022*   Pneumonia Vaccine (2 - PCV) 11/05/2022*   Tetanus Vaccine  11/05/2022*   Mammogram  07/21/2022   Colon Cancer Screening  09/10/2026   Flu Shot  Completed   DEXA scan (bone density measurement)  Completed   COVID-19 Vaccine  Completed   Hepatitis C Screening: USPSTF Recommendation to screen - Ages 52-79 yo.  Completed   HPV Vaccine  Aged Out  *Topic was postponed. The date shown is not the original due date.    Opioid Pain Medicine Management Opioids are powerful medicines that are used to treat moderate to severe pain. When used for short periods of time, they can help you to: Sleep better. Do better in physical or occupational therapy. Feel better in the first few days after an injury. Recover from surgery. Opioids should be taken with the supervision of a trained health care provider. They should be taken for the shortest period of time possible. This is because opioids can be addictive, and the longer you take opioids, the greater your risk of addiction. This addiction can also be called opioid use disorder. What are the risks? Using opioid pain medicines for longer than 3 days increases your risk of side effects. Side effects include: Constipation. Nausea and vomiting. Breathing difficulties (respiratory depression). Drowsiness. Confusion. Opioid use disorder. Itching. Taking opioid pain medicine for a long period of time can affect your ability to do daily tasks. It also  puts you at risk for: Motor vehicle crashes. Depression. Suicide. Heart attack. Overdose, which can be life-threatening. What is a pain treatment plan? A pain treatment plan is an agreement between you and your health care provider. Pain is unique to each person, and treatments vary depending on your condition. To manage your pain, you and your health care provider need to work together. To help you do this: Discuss the goals of your treatment, including how much pain you might expect to have and how you will manage the pain. Review the risks and benefits of taking opioid medicines. Remember that a good treatment plan uses more than one approach and minimizes the chance of side effects. Be honest about the amount of medicines you take and about any drug or alcohol use. Get pain medicine prescriptions from only one health care provider. Pain can be managed with many types of alternative treatments. Ask your health care provider to refer you to one or more specialists who can help you manage pain through: Physical or occupational therapy. Counseling (cognitive behavioral therapy). Good nutrition. Biofeedback. Massage. Meditation. Non-opioid medicine. Following a gentle exercise program. How to use opioid pain medicine Taking medicine Take your pain medicine exactly as told by your health care provider. Take it only when you need it. If your pain gets less severe, you may take less than your prescribed dose if your health care provider approves. If you are not having pain,  do nottake pain medicine unless your health care provider tells you to take it. If your pain is severe, do nottry to treat it yourself by taking more pills than instructed on your prescription. Contact your health care provider for help. Write down the times when you take your pain medicine. It is easy to become confused while on pain medicine. Writing the time can help you avoid overdose. Take other over-the-counter or  prescription medicines only as told by your health care provider. Keeping yourself and others safe  While you are taking opioid pain medicine: Do not drive, use machinery, or power tools. Do not sign legal documents. Do not drink alcohol. Do not take sleeping pills. Do not supervise children by yourself. Do not do activities that require climbing or being in high places. Do not go to a lake, river, ocean, spa, or swimming pool. Do not share your pain medicine with anyone. Keep pain medicine in a locked cabinet or in a secure area where pets and children cannot reach it. Stopping your use of opioids If you have been taking opioid medicine for more than a few weeks, you may need to slowly decrease (taper) how much you take until you stop completely. Tapering your use of opioids can decrease your risk of symptoms of withdrawal, such as: Pain and cramping in the abdomen. Nausea. Sweating. Sleepiness. Restlessness. Uncontrollable shaking (tremors). Cravings for the medicine. Do not attempt to taper your use of opioids on your own. Talk with your health care provider about how to do this. Your health care provider may prescribe a step-down schedule based on how much medicine you are taking and how long you have been taking it. Getting rid of leftover pills Do not save any leftover pills. Get rid of leftover pills safely by: Taking the medicine to a prescription take-back program. This is usually offered by the county or law enforcement. Bringing them to a pharmacy that has a drug disposal container. Flushing them down the toilet. Check the label or package insert of your medicine to see whether this is safe to do. Throwing them out in the trash. Check the label or package insert of your medicine to see whether this is safe to do. If it is safe to throw it out, remove the medicine from the original container, put it into a sealable bag or container, and mix it with used coffee grounds, food  scraps, dirt, or cat litter before putting it in the trash. Follow these instructions at home: Activity Do exercises as told by your health care provider. Avoid activities that make your pain worse. Return to your normal activities as told by your health care provider. Ask your health care provider what activities are safe for you. General instructions You may need to take these actions to prevent or treat constipation: Drink enough fluid to keep your urine pale yellow. Take over-the-counter or prescription medicines. Eat foods that are high in fiber, such as beans, whole grains, and fresh fruits and vegetables. Limit foods that are high in fat and processed sugars, such as fried or sweet foods. Keep all follow-up visits. This is important. Where to find support If you have been taking opioids for a long time, you may benefit from receiving support for quitting from a local support group or counselor. Ask your health care provider for a referral to these resources in your area. Where to find more information Centers for Disease Control and Prevention (CDC): http://www.wolf.info/ U.[place]. Food and Drug Administration (FDA): [place].ch  Get help right away if: You may have taken too much of an opioid (overdosed). Common symptoms of an overdose: Your breathing is slower or more shallow than normal. You have a very slow heartbeat (pulse). You have slurred speech. You have nausea and vomiting. Your pupils become very small. You have other potential symptoms: You are very confused. You faint or feel like you will faint. You have cold, clammy skin. You have blue lips or fingernails. You have thoughts of harming yourself or harming others. These symptoms may represent a serious problem that is an emergency. Do not wait to see if the symptoms will go away. Get medical help right away. Call your local emergency services (911 in the [place] Do not drive yourself to the hospital.  If you ever feel like you may  hurt yourself or others, or have thoughts about taking your own life, get help right away. Go to your nearest emergency department or: Call your local emergency services (911 in the [place] Call the [facility] 8625698596 in the [place] Call a suicide crisis helpline, such as the [place] at 260-545-6970 or 988 in the [place]. This is open 24 hours a day in the [place] Text the Crisis Text Line at (734)285-0271 (in the [place].). Summary Opioid medicines can help you manage moderate to severe pain for a short period of time. A pain treatment plan is an agreement between you and your health care provider. Discuss the goals of your treatment, including how much pain you might expect to have and how you will manage the pain. If you think that you or someone else may have taken too much of an opioid, get medical help right away. This information is not intended to replace advice given to you by your health care provider. Make sure you discuss any questions you have with your health care provider. Document Revised: 04/22/2021 Document Reviewed: 01/07/2021 Elsevier Patient Education  [place] directives: No  Conditions/risks identified: None  Next appointment: Follow up in one year for your annual wellness visit    Preventive Care 65 Years and Older, Female Preventive care refers to lifestyle choices and visits with your health care provider that can promote health and wellness. What does preventive care include? A yearly physical exam. This is also called an annual well check. Dental exams once or twice a year. Routine eye exams. Ask your health care provider how often you should have your eyes checked. Personal lifestyle choices, including: Daily care of your teeth and gums. Regular physical activity. Eating a healthy diet. Avoiding tobacco and drug use. Limiting alcohol use. Practicing safe sex. Taking low-dose aspirin every  day. Taking vitamin and mineral supplements as recommended by your health care provider. What happens during an annual well check? The services and screenings done by your health care provider during your annual well check will depend on your age, overall health, lifestyle risk factors, and family history of disease. Counseling  Your health care provider may ask you questions about your: Alcohol use. Tobacco use. Drug use. Emotional well-being. Home and relationship well-being. Sexual activity. Eating habits. History of falls. Memory and ability to understand (cognition). Work and work Statistician. Reproductive health. Screening  You may have the following tests or measurements: Height, weight, and BMI. Blood pressure. Lipid and cholesterol levels. These may be checked every 5 years, or more frequently if you are over 83 years old. Skin check. Lung cancer screening. You may have this screening  every year starting at age 24 if you have a 30-pack-year history of smoking and currently smoke or have quit within the past 15 years. Fecal occult blood test (FOBT) of the stool. You may have this test every year starting at age 56. Flexible sigmoidoscopy or colonoscopy. You may have a sigmoidoscopy every 5 years or a colonoscopy every 10 years starting at age 39. Hepatitis C blood test. Hepatitis B blood test. Sexually transmitted disease (STD) testing. Diabetes screening. This is done by checking your blood sugar (glucose) after you have not eaten for a while (fasting). You may have this done every 1-3 years. Bone density scan. This is done to screen for osteoporosis. You may have this done starting at age 48. Mammogram. This may be done every 1-2 years. Talk to your health care provider about how often you should have regular mammograms. Talk with your health care provider about your test results, treatment options, and if necessary, the need for more tests. Vaccines  Your health care  provider may recommend certain vaccines, such as: Influenza vaccine. This is recommended every year. Tetanus, diphtheria, and acellular pertussis (Tdap, Td) vaccine. You may need a Td booster every 10 years. Zoster vaccine. You may need this after age 36. Pneumococcal 13-valent conjugate (PCV13) vaccine. One dose is recommended after age 54. Pneumococcal polysaccharide (PPSV23) vaccine. One dose is recommended after age 18. Talk to your health care provider about which screenings and vaccines you need and how often you need them. This information is not intended to replace advice given to you by your health care provider. Make sure you discuss any questions you have with your health care provider. Document Released: 10/24/2015 Document Revised: 06/16/2016 Document Reviewed: 07/29/2015 Elsevier Interactive Patient Education  2017 [place] Prevention in the Home Falls can cause injuries. They can happen to people of all ages. There are many things you can do to make your home safe and to help prevent falls. What can I do on the outside of my home? Regularly fix the edges of walkways and driveways and fix any cracks. Remove anything that might make you trip as you walk through a door, such as a raised step or threshold. Trim any bushes or trees on the path to your home. Use bright outdoor lighting. Clear any walking paths of anything that might make someone trip, such as rocks or tools. Regularly check to see if handrails are loose or broken. Make sure that both sides of any steps have handrails. Any raised decks and porches should have guardrails on the edges. Have any leaves, snow, or ice cleared regularly. Use sand or salt on walking paths during winter. Clean up any spills in your garage right away. This includes oil or grease spills. What can I do in the bathroom? Use night lights. Install grab bars by the toilet and in the tub and shower. Do not use towel bars as grab  bars. Use non-skid mats or decals in the tub or shower. If you need to sit down in the shower, use a plastic, non-slip stool. Keep the floor dry. Clean up any water that spills on the floor as soon as it happens. Remove soap buildup in the tub or shower regularly. Attach bath mats securely with double-sided non-slip rug tape. Do not have throw rugs and other things on the floor that can make you trip. What can I do in the bedroom? Use night lights. Make sure that you have a light by your bed  that is easy to reach. Do not use any sheets or blankets that are too big for your bed. They should not hang down onto the floor. Have a firm chair that has side arms. You can use this for support while you get dressed. Do not have throw rugs and other things on the floor that can make you trip. What can I do in the kitchen? Clean up any spills right away. Avoid walking on wet floors. Keep items that you use a lot in easy-to-reach places. If you need to reach something above you, use a strong step stool that has a grab bar. Keep electrical cords out of the way. Do not use floor polish or wax that makes floors slippery. If you must use wax, use non-skid floor wax. Do not have throw rugs and other things on the floor that can make you trip. What can I do with my stairs? Do not leave any items on the stairs. Make sure that there are handrails on both sides of the stairs and use them. Fix handrails that are broken or loose. Make sure that handrails are as long as the stairways. Check any carpeting to make sure that it is firmly attached to the stairs. Fix any carpet that is loose or worn. Avoid having throw rugs at the top or bottom of the stairs. If you do have throw rugs, attach them to the floor with carpet tape. Make sure that you have a light switch at the top of the stairs and the bottom of the stairs. If you do not have them, ask someone to add them for you. What else can I do to help prevent  falls? Wear shoes that: Do not have high heels. Have rubber bottoms. Are comfortable and fit you well. Are closed at the toe. Do not wear sandals. If you use a stepladder: Make sure that it is fully opened. Do not climb a closed stepladder. Make sure that both sides of the stepladder are locked into place. Ask someone to hold it for you, if possible. Clearly mark and make sure that you can see: Any grab bars or handrails. First and last steps. Where the edge of each step is. Use tools that help you move around (mobility aids) if they are needed. These include: Canes. Walkers. Scooters. Crutches. Turn on the lights when you go into a dark area. Replace any light bulbs as soon as they burn out. Set up your furniture so you have a clear path. Avoid moving your furniture around. If any of your floors are uneven, fix them. If there are any pets around you, be aware of where they are. Review your medicines with your doctor. Some medicines can make you feel dizzy. This can increase your chance of falling. Ask your doctor what other things that you can do to help prevent falls. This information is not intended to replace advice given to you by your health care provider. Make sure you discuss any questions you have with your health care provider. Document Released: 07/24/2009 Document Revised: 03/04/2016 Document Reviewed: 11/01/2014 Elsevier Interactive Patient Education  2017 [employer].

## 2021-11-20 ENCOUNTER — Telehealth: Payer: Self-pay | Admitting: Internal Medicine

## 2021-11-20 ENCOUNTER — Ambulatory Visit: Payer: Medicare HMO | Admitting: Physician Assistant

## 2021-11-20 MED ORDER — RIVAROXABAN 20 MG PO TABS: ORAL_TABLET | ORAL | 3 refills | Status: DC

## 2021-11-20 NOTE — Telephone Encounter (Signed)
° °*  STAT* If patient is at the pharmacy, call can be transferred to refill team.   1. Which medications need to be refilled? (please list name of each medication and dose if known)   rivaroxaban (XARELTO) 20 MG TABS tablet  2. Which pharmacy/location (including street and city if local pharmacy) is medication to be sent to?  [id number] - [place], [place] [address]  3. Do they need a 30 day or 90 day supply?  Patient is requesting 30 day supply

## 2021-11-20 NOTE — Telephone Encounter (Signed)
Prescription refill request for Xarelto received.  Indication:Afib  Last office visit:05/19/21 Chalmers Cater)  QKMMNO:[id number] Age:66 Scr: 0.71 (04/12/21)  CrCl: 102.15ml/min  Appropriate dose and refill sent to requested pharmacy.

## 2021-11-25 ENCOUNTER — Encounter: Payer: Medicare HMO | Admitting: Family Medicine

## 2021-12-03 ENCOUNTER — Ambulatory Visit: Payer: Medicare HMO | Admitting: Physician Assistant

## 2021-12-04 ENCOUNTER — Ambulatory Visit: Payer: Medicare HMO | Admitting: Physician Assistant
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
## 2021-12-15 ENCOUNTER — Ambulatory Visit: Payer: Medicare HMO | Admitting: Physician Assistant

## 2021-12-15 ENCOUNTER — Ambulatory Visit (INDEPENDENT_AMBULATORY_CARE_PROVIDER_SITE_OTHER): Payer: Medicare HMO | Admitting: Family Medicine

## 2021-12-15 ENCOUNTER — Encounter: Payer: Self-pay | Admitting: Physician Assistant

## 2021-12-15 ENCOUNTER — Encounter: Payer: Self-pay | Admitting: Family Medicine

## 2021-12-15 ENCOUNTER — Other Ambulatory Visit: Payer: Self-pay

## 2021-12-15 VITALS — BP 136/74 | HR 74 | Ht 67.0 in | Wt 187.0 lb

## 2021-12-15 VITALS — BP 172/92 | HR 62 | Temp 97.7°F | Ht 67.0 in | Wt 187.5 lb

## 2021-12-15 DIAGNOSIS — I10 Essential (primary) hypertension: Secondary | ICD-10-CM | POA: Diagnosis not present

## 2021-12-15 DIAGNOSIS — R55 Syncope and collapse: Secondary | ICD-10-CM

## 2021-12-15 DIAGNOSIS — I480 Paroxysmal atrial fibrillation: Secondary | ICD-10-CM

## 2021-12-15 DIAGNOSIS — Z23 Encounter for immunization: Secondary | ICD-10-CM

## 2021-12-15 DIAGNOSIS — Z0000 Encounter for general adult medical examination without abnormal findings: Secondary | ICD-10-CM | POA: Diagnosis not present

## 2021-12-15 DIAGNOSIS — G4730 Sleep apnea, unspecified: Secondary | ICD-10-CM

## 2021-12-15 DIAGNOSIS — Z0001 Encounter for general adult medical examination with abnormal findings: Secondary | ICD-10-CM

## 2021-12-15 LAB — CBC WITH DIFFERENTIAL/PLATELET
Basophils Absolute: 0.1 10*3/uL (ref 0.0–0.1)
Basophils Relative: 1.1 % (ref 0.0–3.0)
Eosinophils Absolute: 0.1 10*3/uL (ref 0.0–0.7)
Eosinophils Relative: 2.4 % (ref 0.0–5.0)
HCT: 41.3 % (ref 36.0–46.0)
Hemoglobin: 13.9 g/dL (ref 12.0–15.0)
Lymphocytes Relative: 24.7 % (ref 12.0–46.0)
Lymphs Abs: 1.4 10*3/uL (ref 0.7–4.0)
MCHC: 33.5 g/dL (ref 30.0–36.0)
MCV: 92.5 fl (ref 78.0–100.0)
Monocytes Absolute: 0.7 10*3/uL (ref 0.1–1.0)
Monocytes Relative: 12.8 % — ABNORMAL HIGH (ref 3.0–12.0)
Neutro Abs: 3.2 10*3/uL (ref 1.4–7.7)
Neutrophils Relative %: 59 % (ref 43.0–77.0)
Platelets: 290 10*3/uL (ref 150.0–400.0)
RBC: 4.47 Mil/uL (ref 3.87–5.11)
RDW: 12.9 % (ref 11.5–15.5)
WBC: 5.5 10*3/uL (ref 4.0–10.5)

## 2021-12-15 LAB — BASIC METABOLIC PANEL
BUN: 13 mg/dL (ref 6–23)
CO2: 27 mEq/L (ref 19–32)
Calcium: 10.1 mg/dL (ref 8.4–10.5)
Chloride: 98 mEq/L (ref 96–112)
Creatinine, Ser: 0.67 mg/dL (ref 0.40–1.20)
GFR: 90.57 mL/min (ref >60.00)
Glucose, Bld: 85 mg/dL (ref 70–99)
Potassium: 4.5 mEq/L (ref 3.5–5.1)
Sodium: 134 mEq/L — ABNORMAL LOW (ref 135–145)

## 2021-12-15 LAB — HEPATIC FUNCTION PANEL
ALT: 19 U/L (ref 0–35)
AST: 21 U/L (ref 0–37)
Albumin: 4.6 g/dL (ref 3.5–5.2)
Alkaline Phosphatase: 55 U/L (ref 39–117)
Bilirubin, Direct: 0.1 mg/dL (ref 0.0–0.3)
Total Bilirubin: 0.7 mg/dL (ref 0.2–1.2)
Total Protein: 7.4 g/dL (ref 6.0–8.3)

## 2021-12-15 LAB — LIPID PANEL
Cholesterol: 236 mg/dL — ABNORMAL HIGH (ref 0–200)
HDL: 101.7 mg/dL (ref >39.00)
LDL Cholesterol: 117 mg/dL — ABNORMAL HIGH (ref 0–99)
NonHDL: 134.01
Total CHOL/HDL Ratio: 2
Triglycerides: 85 mg/dL (ref 0.0–149.0)
VLDL: 17 mg/dL (ref 0.0–40.0)

## 2021-12-15 LAB — TSH: TSH: 1.23 u[IU]/mL (ref 0.35–5.50)

## 2021-12-15 MED ORDER — VALSARTAN-HYDROCHLOROTHIAZIDE 160-12.5 MG PO TABS: 1.0000 | ORAL_TABLET | Freq: Every day | ORAL | 3 refills | Status: DC

## 2021-12-15 NOTE — Patient Instructions (Signed)
Medication Instructions:  ? ?Your physician recommends that you continue on your current medications as directed. Please refer to the Current Medication list given to you today. ? ? ?*If you need a refill on your cardiac medications before your next appointment, please call your pharmacy* ? ? ?Lab Work: [place] ? ? ?If you have labs (blood work) drawn today and your tests are completely normal, you will receive your results only by: ?MyChart Message (if you have MyChart) OR ?A paper copy in the mail ?If you have any lab test that is abnormal or we need to change your treatment, we will call you to review the results. ? ?  ?Testing/Procedures: Your physician has recommended that you have a sleep study. This test records several body functions during sleep, including: brain activity, eye movement, oxygen and carbon dioxide blood levels, heart rate and rhythm, breathing rate and rhythm, the flow of air through your mouth and nose, snoring, body muscle movements, and chest and belly movement. ? ? ? ?Follow-Up: ?At [facility], you and your health needs are our priority.  As part of our continuing mission to provide you with exceptional heart care, we have created designated Provider Care Teams.  These Care Teams include your primary Cardiologist (physician) and Advanced Practice Providers (APPs -  Physician Assistants and Nurse Practitioners) who all work together to provide you with the care you need, when you need it. ? ?We recommend signing up for the patient portal called "MyChart".  Sign up information is provided on this After Visit Summary.  MyChart is used to connect with patients for Virtual Visits (Telemedicine).  Patients are able to view lab/test results, encounter notes, upcoming appointments, etc.  Non-urgent messages can be sent to your provider as well.   ?To learn more about what you can do with MyChart, go to [email or web address].   ? ?Your next appointment:   ?6 month(s) ? ?The  format for your next appointment:   ?In Person ? ?Provider:   ?Tommye Standard, PA-C  ? ? ?Other Instructions ?  ?

## 2021-12-15 NOTE — Patient Instructions (Signed)
Recommend daily calcium > 1,200 mg and Vit D 800-1,000 IU.  ? ?STOP the plain Valsartan and start Valsartan HCTZ 160/12.5 ? ?We gave the Prevnar 20 today.  ?

## 2021-12-15 NOTE — Progress Notes (Signed)
Established Patient Office Visit  Subjective:  Patient ID: Stephanie [name], female    DOB: [birth year]  Age: 66 y.o. MRN: [id number]  CC:  Chief Complaint  Patient presents with   Annual Exam    HPI HEYDY MONTILLA presents for physical exam-and for separate issue of uncontrolled hypertension as below.  She has history of A-fib, hypertension, hyperlipidemia, and history of nicotine use.  Quit 2011.  Remains on Xarelto and takes valsartan 80 mg daily and diltiazem 180 mg daily.  Home blood pressures recently been running higher up around 035K to 093G systolic.  No headaches.  No dizziness.  No chest pain.  Health maintenance reviewed  -Flu vaccine already given -She sees GYN and getting mammograms through them and mammogram up-to-date -Colonoscopy due 2027 --Declines shingles vaccine -Needs Prevnar 20. -Previous hepatitis C screen negative  Social history-married.  Smoking history as above.  Does drink several beers per day.  Retired 2019.  2 children  Family history-father and brother with heart disease in their 35s  Past Medical History:  Diagnosis Date   Diverticulitis 06/2016   Hyperlipidemia    Hypertension    Osteoporosis    Paroxysmal atrial fibrillation ([place]) 01/26/2016    History reviewed. No pertinent surgical history.  Family History  Problem Relation Age of Onset   Heart disease Father 64       CAD   Sudden death Other        family hx   Alzheimer's disease Mother    Heart disease Brother 45       CAD    Social History   Socioeconomic History   Marital status: Married    Spouse name: Not on file   Number of children: 2   Years of education: Not on file   Highest education level: Not on file  Occupational History   Not on file  Tobacco Use   Smoking status: Former    Packs/day: 1.00    Years: 30.00    Pack years: 30.00    Types: Cigarettes    Quit date: 05/31/2010    Years since quitting: 11.5   Smokeless tobacco: Never  Vaping Use   Vaping  Use: Never used  Substance and Sexual Activity   Alcohol use: Yes    Alcohol/week: 2.0 standard drinks    Types: 2 Cans of beer per week    Comment: occ   Drug use: No   Sexual activity: Not on file  Other Topics Concern   Not on file  Social History Narrative   Not on file   Social Determinants of Health   Financial Resource Strain: Low Risk    Difficulty of Paying Living Expenses: Not hard at all  Food Insecurity: No Food Insecurity   Worried About Charity fundraiser in the Last Year: Never true   [place] in the Last Year: Never true  Transportation Needs: No Transportation Needs   Lack of Transportation (Medical): No   Lack of Transportation (Non-Medical): No  Physical Activity: Insufficiently Active   Days of Exercise per Week: 1 day   Minutes of Exercise per Session: 10 min  Stress: No Stress Concern Present   Feeling of Stress : Not at all  Social Connections: Moderately Integrated   Frequency of Communication with Friends and Family: More than three times a week   Frequency of Social Gatherings with Friends and Family: More than three times a week   Attends Religious Services: More  than 4 times per year   Active Member of Clubs or Organizations: No   Attends Archivist Meetings: Never   Marital Status: Married  Human resources officer Violence: Not At Risk   Fear of Current or Ex-Partner: No   Emotionally Abused: No   Physically Abused: No   Sexually Abused: No    Outpatient Medications Prior to Visit  Medication Sig Dispense Refill   alendronate (FOSAMAX) 70 MG tablet Take 70 mg by mouth once a week.     ALPRAZolam (XANAX) 0.5 MG tablet Take 1 tablet (0.5 mg total) by mouth 3 (three) times daily as needed for anxiety. 30 tablet 0   diltiazem (CARDIZEM CD) 180 MG 24 hr capsule TAKE 1 CAPSULE BY MOUTH EVERY DAY 90 capsule 3   FLUoxetine (PROZAC) 20 MG capsule TAKE 1 CAPSULE BY MOUTH EVERY DAY 90 capsule 3   metoprolol succinate (TOPROL-XL) 50 MG 24 hr  tablet Take 1/2 tablet by mouth if needed for breakthrough afib for HR >100 as long as BP > 100 30 tablet 3   rivaroxaban (XARELTO) 20 MG TABS tablet TAKE 1 TABLET BY MOUTH EVERY DAY WITH SUPPER 30 tablet 3   rosuvastatin (CRESTOR) 40 MG tablet Take 1 tablet (40 mg total) by mouth daily. 90 tablet 3   doxycycline (VIBRAMYCIN) 100 MG capsule Take 1 capsule (100 mg total) by mouth 2 (two) times daily. 14 capsule 0   HYDROcodone bit-homatropine (HYCODAN) 5-1.5 MG/5ML syrup Take 5 mLs by mouth every 8 (eight) hours as needed for cough. 120 mL 0   valsartan (DIOVAN) 80 MG tablet TAKE 1 TABLET BY MOUTH EVERY DAY 90 tablet 3   Facility-Administered Medications Prior to Visit  Medication Dose Route Frequency Provider Last Rate Last Admin   0.9 %  sodium chloride infusion  500 mL Intravenous Continuous Nandigam, Kavitha V, MD        No Known Allergies  ROS Review of Systems  Constitutional:  Negative for activity change, appetite change, fatigue, fever and unexpected weight change.  HENT:  Negative for ear pain, hearing loss, sore throat and trouble swallowing.   Eyes:  Negative for visual disturbance.  Respiratory:  Negative for cough and shortness of breath.   Cardiovascular:  Negative for chest pain and palpitations.  Gastrointestinal:  Negative for abdominal pain, blood in stool, constipation and diarrhea.  Genitourinary:  Negative for dysuria and hematuria.  Musculoskeletal:  Negative for arthralgias, back pain and myalgias.  Skin:  Negative for rash.  Neurological:  Negative for dizziness, syncope and headaches.  Hematological:  Negative for adenopathy.  Psychiatric/Behavioral:  Negative for confusion and dysphoric mood.      Objective:    Physical Exam Constitutional:      Appearance: She is well-developed.  HENT:     Head: Normocephalic and atraumatic.  Eyes:     Pupils: Pupils are equal, round, and reactive to light.  Neck:     Thyroid: No thyromegaly.  Cardiovascular:      Rate and Rhythm: Normal rate and regular rhythm.     Heart sounds: Normal heart sounds. No murmur heard. Pulmonary:     Effort: No respiratory distress.     Breath sounds: Normal breath sounds. No wheezing or rales.  Abdominal:     General: Bowel sounds are normal. There is no distension.     Palpations: Abdomen is soft. There is no mass.     Tenderness: There is no abdominal tenderness. There is no guarding or rebound.  Musculoskeletal:        General: Normal range of motion.     Cervical back: Normal range of motion and neck supple.     Right lower leg: No edema.     Left lower leg: No edema.  Lymphadenopathy:     Cervical: No cervical adenopathy.  Skin:    Findings: No rash.  Neurological:     Mental Status: She is alert and oriented to person, place, and time.     Cranial Nerves: No cranial nerve deficit.  Psychiatric:        Behavior: Behavior normal.        Thought Content: Thought content normal.        Judgment: Judgment normal.    BP (!) 172/92 (BP Location: Left Arm, Cuff Size: Normal)    Pulse 62    Temp 97.7 F (36.5 C) (Oral)    Ht '5\' 7"'$  (1.702 m)    Wt 187 lb 8 oz (85 kg)    SpO2 97%    BMI 29.37 kg/m  Wt Readings from Last 3 Encounters:  12/15/21 187 lb 8 oz (85 kg)  11/05/21 186 lb (84.4 kg)  07/15/21 184 lb (83.5 kg)     There are no preventive care reminders to display for this patient.  There are no preventive care reminders to display for this patient.  Lab Results  Component Value Date   TSH 1.956 02/27/2018   Lab Results  Component Value Date   WBC 6.8 04/12/2021   HGB 13.6 04/12/2021   HCT 40.5 04/12/2021   MCV 91.8 04/12/2021   PLT 319 04/12/2021   Lab Results  Component Value Date   NA 133 (L) 04/12/2021   K 4.3 04/12/2021   CO2 26 04/12/2021   GLUCOSE 95 04/12/2021   BUN 12 04/12/2021   CREATININE 0.71 04/12/2021   BILITOT 0.9 04/12/2021   ALKPHOS 60 04/12/2021   AST 16 04/12/2021   ALT 14 04/12/2021   PROT 7.5 04/12/2021    ALBUMIN 4.2 04/12/2021   CALCIUM 9.9 04/12/2021   ANIONGAP 9 04/12/2021   GFR 99.61 09/27/2017   Lab Results  Component Value Date   CHOL 243 (H) 08/12/2020   Lab Results  Component Value Date   HDL 101 08/12/2020   Lab Results  Component Value Date   LDLCALC 125 (H) 08/12/2020   Lab Results  Component Value Date   TRIG 76 08/12/2020   Lab Results  Component Value Date   CHOLHDL 2.4 08/12/2020   No results found for: HGBA1C    Assessment & Plan:   Problem List Items Addressed This Visit   None Visit Diagnoses     Physical exam    -  Primary   Relevant Orders   Basic metabolic panel   Lipid panel   CBC with Differential/Platelet   TSH   Hepatic function panel     She has hypertension history and very poorly controlled by today's reading.  Repeat left arm seated after rest 172/92.  Excessive alcohol use currently which is likely contributing.  -Prevnar 20 given -Change Diovan 80 mg to Diovan HCTZ 160/12.5 mg 1 daily -Scale back alcohol use -Handout on DASH diet given.  Try to keep sodium intake less than 2500 mg daily -Discussed recommendations for adequate calcium and vitamin D consumption -Step up aerobic activity such as walking -Set up 1 month follow-up to reassess blood pressure-and consider repeat basic metabolic panel man with initiation of HCTZ  Meds ordered  this encounter  Medications   valsartan-hydrochlorothiazide (DIOVAN-HCT) 160-12.5 MG tablet    Sig: Take 1 tablet by mouth daily.    Dispense:  90 tablet    Refill:  3    Follow-up: Return in about 1 month (around 01/15/2022).    Carolann Littler, MD

## 2021-12-16 MED ORDER — EZETIMIBE 10 MG PO TABS: 10.0000 mg | ORAL_TABLET | Freq: Every day | ORAL | 1 refills | Status: DC

## 2021-12-16 NOTE — Addendum Note (Signed)
Addended by: Nilda Riggs on: 12/16/2021 08:47 AM ? ? Modules accepted: Orders ? ?

## 2021-12-20 ENCOUNTER — Other Ambulatory Visit: Payer: Self-pay | Admitting: Family Medicine

## 2022-01-13 ENCOUNTER — Other Ambulatory Visit: Payer: Self-pay | Admitting: Family Medicine

## 2022-02-17 ENCOUNTER — Other Ambulatory Visit: Payer: Self-pay | Admitting: Family Medicine

## 2022-05-11 ENCOUNTER — Other Ambulatory Visit: Payer: Self-pay | Admitting: Family Medicine

## 2022-05-12 ENCOUNTER — Encounter: Payer: Self-pay | Admitting: Internal Medicine

## 2022-05-12 ENCOUNTER — Other Ambulatory Visit: Payer: Self-pay

## 2022-05-12 DIAGNOSIS — I480 Paroxysmal atrial fibrillation: Secondary | ICD-10-CM

## 2022-05-12 MED ORDER — RIVAROXABAN 20 MG PO TABS: ORAL_TABLET | ORAL | 1 refills | Status: DC

## 2022-05-12 NOTE — Telephone Encounter (Signed)
Prescription refill request for Xarelto received.  Indication:Afib  Last office visit: 12/15/21 Stephanie [name]) Weight: 84.4kg Age: 66 Scr: 0.67 (12/15/21) CrCl: 108.31m/min  Appropriate dose and refill sent to requested pharmacy.

## 2022-06-10 ENCOUNTER — Other Ambulatory Visit: Payer: Self-pay | Admitting: Family Medicine

## 2022-06-28 ENCOUNTER — Encounter: Payer: Self-pay | Admitting: Family Medicine

## 2022-06-29 ENCOUNTER — Ambulatory Visit (INDEPENDENT_AMBULATORY_CARE_PROVIDER_SITE_OTHER): Payer: Medicare HMO | Admitting: Family Medicine

## 2022-06-29 ENCOUNTER — Encounter: Payer: Self-pay | Admitting: Family Medicine

## 2022-06-29 VITALS — BP 156/78 | HR 65 | Temp 98.4°F | Ht 67.0 in | Wt 191.5 lb

## 2022-06-29 DIAGNOSIS — Z23 Encounter for immunization: Secondary | ICD-10-CM

## 2022-06-29 DIAGNOSIS — I10 Essential (primary) hypertension: Secondary | ICD-10-CM | POA: Diagnosis not present

## 2022-06-29 MED ORDER — DILTIAZEM HCL ER COATED BEADS 240 MG PO CP24: 240.0000 mg | ORAL_CAPSULE | Freq: Every day | ORAL | 3 refills | Status: DC

## 2022-06-29 NOTE — Addendum Note (Signed)
Addended by: Nilda Riggs on: 06/29/2022 10:35 AM   Modules accepted: Orders

## 2022-06-29 NOTE — Progress Notes (Signed)
Established Patient Office Visit  Subjective   Patient ID: Stephanie [name], female    DOB: Apr 22, 1956  Age: 66 y.o. MRN: [id number]  Chief Complaint  Patient presents with   Hypertension         HPI   Ms. Castrogiovanni has history of hypertension, hyperlipidemia, atrial fibrillation.  She is here today with concerns of elevated blood pressure.  She is currently on valsartan HCTZ and diltiazem CD 180 mg.  Compliant with medications although she possibly skipped dose on Sunday.  She felt poorly yesterday and had blood pressure readings of 193/93 and 170/108.  Her blood pressure was consistently up then but down somewhat today.  She does have suspected whitecoat syndrome.  She has gained a little weight recently.  Does not watch his salt intake closely.  No regular nonsteroidal use.  Denies recent peripheral edema.  Past Medical History:  Diagnosis Date   Diverticulitis 06/2016   Hyperlipidemia    Hypertension    Osteoporosis    Paroxysmal atrial fibrillation ([place]) 01/26/2016   History reviewed. No pertinent surgical history.  reports that she quit smoking about 12 years ago. Her smoking use included cigarettes. She has a 30.00 pack-year smoking history. She has never used smokeless tobacco. She reports current alcohol use of about 2.0 standard drinks of alcohol per week. She reports that she does not use drugs. family history includes Alzheimer's disease in her mother; Heart disease (age of onset: 41) in her brother and father; Sudden death in an other family member. No Known Allergies  Review of Systems  Constitutional:  Negative for malaise/fatigue.  Eyes:  Negative for blurred vision.  Respiratory:  Negative for shortness of breath.   Cardiovascular:  Negative for chest pain.  Neurological:  Negative for dizziness, weakness and headaches.      Objective:     BP (!) 156/78 (BP Location: Left Arm, Patient Position: Sitting, Cuff Size: Normal)   Pulse 65   Temp 98.4 F (36.9 C)  (Oral)   Ht '5\' 7"'$  (1.702 m)   Wt 191 lb 8 oz (86.9 kg)   SpO2 98%   BMI 29.99 kg/m    Physical Exam Vitals reviewed.  Constitutional:      Appearance: She is well-developed.  Eyes:     Pupils: Pupils are equal, round, and reactive to light.  Neck:     Thyroid: No thyromegaly.     Vascular: No JVD.  Cardiovascular:     Rate and Rhythm: Normal rate and regular rhythm.     Heart sounds:     No gallop.  Pulmonary:     Effort: Pulmonary effort is normal. No respiratory distress.     Breath sounds: Normal breath sounds. No wheezing or rales.  Musculoskeletal:     Cervical back: Neck supple.     Right lower leg: No edema.     Left lower leg: No edema.  Neurological:     Mental Status: She is alert.      No results found for any visits on 06/29/22.    The ASCVD Risk score (Arnett DK, et al., 2019) failed to calculate for the following reasons:   The valid HDL cholesterol range is 20 to 100 mg/dL    Assessment & Plan:   Problem List Items Addressed This Visit       Unprioritized   Essential hypertension - Primary   Relevant Medications   diltiazem (CARTIA XT) 240 MG 24 hr capsule  Hypertension poorly controlled.  There is concern for possible element of whitecoat syndrome.  -We discussed nonpharmacologic management with weight loss, regular aerobic exercise such as walking and sodium reduction -Bump Cardizem CD up to 240 mg daily.  Monitor closely over the next couple weeks.  Set up 1 month follow-up.  If not close to goal at that time consider aldosterone antagonist such as spironolactone  Return in about 4 weeks (around 07/27/2022).    Carolann Littler, MD

## 2022-07-30 ENCOUNTER — Ambulatory Visit (INDEPENDENT_AMBULATORY_CARE_PROVIDER_SITE_OTHER): Payer: Medicare HMO | Admitting: Family Medicine

## 2022-07-30 ENCOUNTER — Encounter: Payer: Self-pay | Admitting: Family Medicine

## 2022-07-30 VITALS — BP 136/82 | HR 63 | Temp 98.6°F | Ht 67.0 in | Wt 191.8 lb

## 2022-07-30 DIAGNOSIS — E785 Hyperlipidemia, unspecified: Secondary | ICD-10-CM

## 2022-07-30 DIAGNOSIS — I10 Essential (primary) hypertension: Secondary | ICD-10-CM | POA: Diagnosis not present

## 2022-07-30 LAB — LIPID PANEL
Cholesterol: 190 mg/dL (ref 0–200)
HDL: 81.7 mg/dL (ref >39.00)
LDL Cholesterol: 91 mg/dL (ref 0–99)
NonHDL: 108.18
Total CHOL/HDL Ratio: 2
Triglycerides: 85 mg/dL (ref 0.0–149.0)
VLDL: 17 mg/dL (ref 0.0–40.0)

## 2022-07-30 LAB — HEPATIC FUNCTION PANEL
ALT: 27 U/L (ref 0–35)
AST: 26 U/L (ref 0–37)
Albumin: 4.5 g/dL (ref 3.5–5.2)
Alkaline Phosphatase: 61 U/L (ref 39–117)
Bilirubin, Direct: 0.1 mg/dL (ref 0.0–0.3)
Total Bilirubin: 0.7 mg/dL (ref 0.2–1.2)
Total Protein: 7.5 g/dL (ref 6.0–8.3)

## 2022-07-30 NOTE — Patient Instructions (Signed)
Consider bringing your cuff at follow up to compare with ours.

## 2022-07-30 NOTE — Progress Notes (Signed)
Established Patient Office Visit  Subjective   Patient ID: Stephanie [name], female    DOB: Dec 14, 1955  Age: 66 y.o. MRN: [id number]  Chief Complaint  Patient presents with   Follow-up    Patient states she is feeling better and blood pressure readings at home have been in the ranges 120/70s    HPI   [name] is seen for follow-up hypertension.  We recently increased her diltiazem 120 mg to 240 mg.  She had a couple of blood pressure spikes at home along with headache and has had none since then.  Her blood pressure is consistently been 120s over 70s.  She did have reading this morning 140/76 at home but she thinks some of that was in relation to anxiety over coming.  She has history of suspected "white coat syndrome ".  She is not engaging in any regular exercise.  Recent headaches.  No chest pains.  She has history of hyperlipidemia.  We had recently added Zetia to her high-dose statin with Crestor 40 mg daily.  She would like to get follow-up lipids today.  No significant myalgias.  Past Medical History:  Diagnosis Date   Diverticulitis 06/2016   Hyperlipidemia    Hypertension    Osteoporosis    Paroxysmal atrial fibrillation ([place]) 01/26/2016   History reviewed. No pertinent surgical history.  reports that she quit smoking about 12 years ago. Her smoking use included cigarettes. She has a 30.00 pack-year smoking history. She has never used smokeless tobacco. She reports current alcohol use of about 2.0 standard drinks of alcohol per week. She reports that she does not use drugs. family history includes Alzheimer's disease in her mother; Heart disease (age of onset: 28) in her brother and father; Sudden death in an other family member. No Known Allergies  Review of Systems  Constitutional:  Negative for malaise/fatigue.  Eyes:  Negative for blurred vision.  Respiratory:  Negative for shortness of breath.   Cardiovascular:  Negative for chest pain.  Neurological:  Negative for  dizziness, weakness and headaches.      Objective:     BP 136/82 (BP Location: Left Arm, Cuff Size: Large)   Pulse 63   Temp 98.6 F (37 C) (Oral)   Ht '5\' 7"'$  (1.702 m)   Wt 191 lb 12.8 oz (87 kg)   SpO2 98%   BMI 30.04 kg/m  BP Readings from Last 3 Encounters:  07/30/22 136/82  06/29/22 (!) 156/78  12/15/21 136/74   Wt Readings from Last 3 Encounters:  07/30/22 191 lb 12.8 oz (87 kg)  06/29/22 191 lb 8 oz (86.9 kg)  12/15/21 187 lb (84.8 kg)      Physical Exam Vitals reviewed.  Constitutional:      Appearance: She is well-developed.  Eyes:     Pupils: Pupils are equal, round, and reactive to light.  Neck:     Thyroid: No thyromegaly.     Vascular: No JVD.  Cardiovascular:     Rate and Rhythm: Normal rate and regular rhythm.     Heart sounds:     No gallop.  Pulmonary:     Effort: Pulmonary effort is normal. No respiratory distress.     Breath sounds: Normal breath sounds. No wheezing or rales.  Musculoskeletal:     Cervical back: Neck supple.  Neurological:     Mental Status: She is alert.      No results found for any visits on 07/30/22.  Last CBC Lab Results  Component Value Date   WBC 5.5 12/15/2021   HGB 13.9 12/15/2021   HCT 41.3 12/15/2021   MCV 92.5 12/15/2021   MCH 30.8 04/12/2021   RDW 12.9 12/15/2021   PLT 290.0 48/18/5631   Last metabolic panel Lab Results  Component Value Date   GLUCOSE 85 12/15/2021   NA 134 (L) 12/15/2021   K 4.5 12/15/2021   CL 98 12/15/2021   CO2 27 12/15/2021   BUN 13 12/15/2021   CREATININE 0.67 12/15/2021   GFRNONAA >60 04/12/2021   CALCIUM 10.1 12/15/2021   PROT 7.4 12/15/2021   ALBUMIN 4.6 12/15/2021   BILITOT 0.7 12/15/2021   ALKPHOS 55 12/15/2021   AST 21 12/15/2021   ALT 19 12/15/2021   ANIONGAP 9 04/12/2021   Last lipids Lab Results  Component Value Date   CHOL 236 (H) 12/15/2021   HDL 101.70 12/15/2021   LDLCALC 117 (H) 12/15/2021   LDLDIRECT 158.5 08/30/2013   TRIG 85.0 12/15/2021    CHOLHDL 2 12/15/2021      The ASCVD Risk score (Arnett DK, et al., 2019) failed to calculate for the following reasons:   The valid HDL cholesterol range is 20 to 100 mg/dL    Assessment & Plan:   #1 hypertension improved by home readings though still elevated today.  Possible whitecoat syndrome.  She has a daughter who is an Therapist, sports and we have asked that she get her daughter to check her home blood pressure cuff with manual and also consider bringing her cuff here next visit to check with ours -Strongly advocated regular aerobic exercise and weight loss -Continue current regimen for now all of diltiazem, Diovan HCTZ, and metoprolol.  #2 hyperlipidemia.  Patient on high-dose statin.  Recent addition of Zetia 10 mg daily.  Tolerating well.  Recheck lipid and hepatic panel.  Continue low saturated fat diet   No follow-ups on file.    Carolann Littler, MD

## 2022-09-29 ENCOUNTER — Encounter: Payer: Self-pay | Admitting: Family Medicine

## 2022-09-29 ENCOUNTER — Ambulatory Visit (INDEPENDENT_AMBULATORY_CARE_PROVIDER_SITE_OTHER): Payer: Medicare HMO | Admitting: Family Medicine

## 2022-09-29 VITALS — BP 128/72 | HR 70 | Temp 98.9°F | Ht 67.0 in | Wt 190.7 lb

## 2022-09-29 DIAGNOSIS — R059 Cough, unspecified: Secondary | ICD-10-CM

## 2022-09-29 MED ORDER — DOXYCYCLINE HYCLATE 100 MG PO CAPS: 100.0000 mg | ORAL_CAPSULE | Freq: Two times a day (BID) | ORAL | 0 refills | Status: DC

## 2022-09-29 NOTE — Progress Notes (Signed)
   Established Patient Office Visit  Subjective   Patient ID: Stephanie [name], female    DOB: 1956-07-01  Age: 66 y.o. MRN: [id number]  Chief Complaint  Patient presents with   Cough    Patient complains of cough, Productive cough with bloody sputum, x6 days, Tried Tylenol and Claratin with little relief    Generalized Body Aches    Patient complains of body aches, x6 days     HPI   Stephanie [name] is seen with cough and some general body aches.  Started about 6 days ago.  She is not aware of any definite fever.  Cough was initially dry and now somewhat productive.  She has had some thick colored sputum occasionally ?blood-tinged.  No dyspnea.  No chills.  Some nasal congestion.  No known allergies.  She quit smoking back about 12 years ago.  No recent appetite or weight changes.  Past Medical History:  Diagnosis Date   Diverticulitis 06/2016   Hyperlipidemia    Hypertension    Osteoporosis    Paroxysmal atrial fibrillation ([place]) 01/26/2016   History reviewed. No pertinent surgical history.  reports that she quit smoking about 12 years ago. Her smoking use included cigarettes. She has a 30.00 pack-year smoking history. She has never used smokeless tobacco. She reports current alcohol use of about 2.0 standard drinks of alcohol per week. She reports that she does not use drugs. family history includes Alzheimer's disease in her mother; Heart disease (age of onset: 28) in her brother and father; Sudden death in an other family member. No Known Allergies  Review of Systems  Constitutional:  Negative for chills and fever.  HENT:  Positive for congestion.   Respiratory:  Positive for cough and sputum production. Negative for shortness of breath and wheezing.   Cardiovascular:  Negative for chest pain.      Objective:     BP 128/72 (BP Location: Left Arm, Patient Position: Sitting, Cuff Size: Normal)   Pulse 70   Temp 98.9 F (37.2 C) (Oral)   Ht '5\' 7"'$  (1.702 m)   Wt 190 lb 11.2 oz (86.5  kg)   SpO2 98%   BMI 29.87 kg/m    Physical Exam Vitals reviewed.  Constitutional:      Appearance: Normal appearance.  Cardiovascular:     Rate and Rhythm: Normal rate and regular rhythm.  Pulmonary:     Effort: Pulmonary effort is normal.     Breath sounds: Normal breath sounds. No wheezing or rales.  Musculoskeletal:     Cervical back: Neck supple.  Lymphadenopathy:     Cervical: No cervical adenopathy.  Neurological:     Mental Status: She is alert.      No results found for any visits on 09/29/22.    The 10-year ASCVD risk score (Arnett DK, et al., 2019) is: 9%    Assessment & Plan:   Patient presents with 1 week history of cough.  Now productive.  Past history of smoking.  Does not have any red flags such as appetite change, fever, weight loss, or dyspnea. -We elected to go and start doxycycline 100 mg twice daily for 7 days. -Plenty of fluids and rest -Follow-up immediately for any fever, increased shortness of breath, or other concerns. -She knows if she has any further blood-tinged mucus to follow-up next week for chest x-ray and she agrees  Carolann Littler, MD

## 2022-09-29 NOTE — Patient Instructions (Signed)
Let me know if not improving by next week and especially for any fever or coughing up any blood.

## 2022-11-10 ENCOUNTER — Other Ambulatory Visit: Payer: Self-pay | Admitting: Internal Medicine

## 2022-11-10 DIAGNOSIS — I480 Paroxysmal atrial fibrillation: Secondary | ICD-10-CM

## 2022-11-10 NOTE — Telephone Encounter (Signed)
Xarelto '20mg'$  refill request received. Pt is 67 years old, weight-86.5kg, Crea-0.67 on 12/15/21, last seen by Tommye Standard on 12/15/21, Diagnosis-Afib, CrCl-109.74 mL/min; Dose is appropriate based on dosing criteria. Will send in refill to requested pharmacy.

## 2022-11-30 ENCOUNTER — Encounter: Payer: Self-pay | Admitting: Family Medicine

## 2022-12-01 ENCOUNTER — Ambulatory Visit (INDEPENDENT_AMBULATORY_CARE_PROVIDER_SITE_OTHER): Payer: Medicare HMO | Admitting: Family Medicine

## 2022-12-01 ENCOUNTER — Encounter: Payer: Self-pay | Admitting: Family Medicine

## 2022-12-01 VITALS — BP 160/80 | HR 75 | Temp 98.0°F | Ht 67.0 in | Wt 190.3 lb

## 2022-12-01 DIAGNOSIS — R0781 Pleurodynia: Secondary | ICD-10-CM

## 2022-12-01 DIAGNOSIS — M79672 Pain in left foot: Secondary | ICD-10-CM

## 2022-12-01 MED ORDER — HYDROCODONE-ACETAMINOPHEN 5-325 MG PO TABS: 1.0000 | ORAL_TABLET | Freq: Four times a day (QID) | ORAL | 0 refills | Status: AC | PRN

## 2022-12-01 NOTE — Progress Notes (Signed)
Established Patient Office Visit  Subjective   Patient ID: Stephanie [name], female    DOB: Feb 10, 1956  Age: 67 y.o. MRN: [id number]  Chief Complaint  Patient presents with   Fall    HPI   Neeharika is seen with injuries today following a fall.  She [place] the local rec [place] at [place] game and was going up the first [place].  She reached over to grab a pole to help steady herself to pull herself up in the pole gave way and she fell over.  No head injury.  No loss of consciousness.  She was aware of some left foot pain afterwards followed by some bruising and mild swelling.  Her left foot is much better at this time.  She had some left anterior rib pain which was not noted until the next day.  Mild left knee pain which has essentially resolved.  Past Medical History:  Diagnosis Date   Diverticulitis 06/2016   Hyperlipidemia    Hypertension    Osteoporosis    Paroxysmal atrial fibrillation ([place]) 01/26/2016   History reviewed. No pertinent surgical history.  reports that she quit smoking about 12 years ago. Her smoking use included cigarettes. She has a 30.00 pack-year smoking history. She has never used smokeless tobacco. She reports current alcohol use of about 2.0 standard drinks of alcohol per week. She reports that she does not use drugs. family history includes Alzheimer's disease in her mother; Heart disease (age of onset: 25) in her brother and father; Sudden death in an other family member. No Known Allergies  Review of Systems  Constitutional:  Negative for chills and fever.  Neurological:  Negative for loss of consciousness, weakness and headaches.      Objective:     BP (!) 160/80 (BP Location: Left Arm, Patient Position: Sitting, Cuff Size: Normal)   Pulse 75   Temp 98 F (36.7 C) (Oral)   Ht 5' 7"$  (1.702 m)   Wt 190 lb 4.8 oz (86.3 kg)   SpO2 98%   BMI 29.81 kg/m  BP Readings from Last 3 Encounters:  12/01/22 (!) 160/80  09/29/22 128/72  07/30/22 136/82    Wt Readings from Last 3 Encounters:  12/01/22 190 lb 4.8 oz (86.3 kg)  09/29/22 190 lb 11.2 oz (86.5 kg)  07/30/22 191 lb 12.8 oz (87 kg)      Physical Exam Vitals reviewed.  Constitutional:      Appearance: Normal appearance.  Cardiovascular:     Rate and Rhythm: Normal rate and regular rhythm.  Pulmonary:     Effort: Pulmonary effort is normal.     Breath sounds: Normal breath sounds. No wheezing or rales.  Musculoskeletal:     Comments: Mild tenderness left anterior rib cage area around the ninth to 11th ribs diffusely  Left foot reveals some mild ecchymosis laterally and dorsally.  Minimal tenderness proximal fourth metacarpal region.  Full range of motion ankle.  No ankle tenderness.    Neurological:     Mental Status: She is alert.      No results found for any visits on 12/01/22.    The 10-year ASCVD risk score (Arnett DK, et al., 2019) is: 13.7%    Assessment & Plan:   Recent fall with left foot and left anterior rib pain.  She does have some visible bruising of the foot.  We discussed x-rays to rule out fracture but she declines.  She is ambulating much better with essentially no  or minimal pain with ambulation. We also discussed the fact she could have possibly cracked a rib but that x-rays would not likely change management. She is having some difficulty sleeping with her rib pain at night and not getting adequate relief with plain Tylenol.  We wrote for limited Vicodin 5/325 mg 1 every 6 hours as needed #20 with no refill   Carolann Littler, MD

## 2022-12-04 ENCOUNTER — Other Ambulatory Visit: Payer: Self-pay | Admitting: Family Medicine

## 2022-12-08 ENCOUNTER — Other Ambulatory Visit: Payer: Self-pay | Admitting: Family Medicine

## 2022-12-22 DIAGNOSIS — Z1231 Encounter for screening mammogram for malignant neoplasm of breast: Secondary | ICD-10-CM | POA: Diagnosis not present

## 2022-12-22 DIAGNOSIS — Z0000 Encounter for general adult medical examination without abnormal findings: Secondary | ICD-10-CM | POA: Diagnosis not present

## 2022-12-22 DIAGNOSIS — Z6830 Body mass index (BMI) 30.0-30.9, adult: Secondary | ICD-10-CM | POA: Diagnosis not present

## 2022-12-22 DIAGNOSIS — Z01419 Encounter for gynecological examination (general) (routine) without abnormal findings: Secondary | ICD-10-CM | POA: Diagnosis not present

## 2022-12-22 DIAGNOSIS — Z01411 Encounter for gynecological examination (general) (routine) with abnormal findings: Secondary | ICD-10-CM | POA: Diagnosis not present

## 2022-12-22 DIAGNOSIS — Z124 Encounter for screening for malignant neoplasm of cervix: Secondary | ICD-10-CM | POA: Diagnosis not present

## 2022-12-22 DIAGNOSIS — M8580 Other specified disorders of bone density and structure, unspecified site: Secondary | ICD-10-CM | POA: Diagnosis not present

## 2022-12-22 LAB — HM MAMMOGRAPHY

## 2023-01-13 DIAGNOSIS — M8589 Other specified disorders of bone density and structure, multiple sites: Secondary | ICD-10-CM | POA: Diagnosis not present

## 2023-03-24 ENCOUNTER — Other Ambulatory Visit: Payer: Self-pay | Admitting: Family Medicine

## 2023-04-10 ENCOUNTER — Other Ambulatory Visit: Payer: Self-pay | Admitting: Family Medicine

## 2023-05-12 ENCOUNTER — Other Ambulatory Visit: Payer: Self-pay | Admitting: Family Medicine

## 2023-05-12 ENCOUNTER — Other Ambulatory Visit: Payer: Self-pay | Admitting: Internal Medicine

## 2023-05-12 DIAGNOSIS — I480 Paroxysmal atrial fibrillation: Secondary | ICD-10-CM

## 2023-05-12 NOTE — Telephone Encounter (Addendum)
Xarelto 20mg  refill request received. Pt is 67 years old, weight-86.3kg, Crea-0.67 on 12/15/21-Needs Labs, last seen by Francis Dowse on 12/15/21-Needs Appt, Diagnosis-Afib, CrCl-86.27 mL/min; Dose is appropriate based on dosing criteria.  Pt needs labs and an appt.   05/13/23-message left by the Schedulers, pt has an appt on 06/10/23 with Francis Dowse, will send in refill at this time & placed lab order

## 2023-05-13 ENCOUNTER — Telehealth: Payer: Self-pay | Admitting: Internal Medicine

## 2023-05-13 NOTE — Telephone Encounter (Signed)
*  STAT* If patient is at the pharmacy, call can be transferred to refill team.   1. Which medications need to be refilled? (please list name of each medication and dose if known) XARELTO 20 MG TABS tablet   2. Which pharmacy/location (including street and city if local pharmacy) is medication to be sent to?  [id number] - [place],  - [address]    3. Do they need a 30 day or 90 day supply? 90

## 2023-05-13 NOTE — Telephone Encounter (Signed)
Duplicate request, please refer to xarelto refill encounter from today.

## 2023-06-02 ENCOUNTER — Other Ambulatory Visit: Payer: Self-pay | Admitting: Family Medicine
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
## 2023-06-10 ENCOUNTER — Ambulatory Visit: Payer: Medicare HMO | Attending: Physician Assistant | Admitting: Physician Assistant

## 2023-06-10 ENCOUNTER — Encounter: Payer: Self-pay | Admitting: Physician Assistant

## 2023-06-10 ENCOUNTER — Ambulatory Visit: Payer: Medicare HMO

## 2023-06-10 VITALS — BP 160/92 | HR 62 | Ht 67.0 in | Wt 190.6 lb

## 2023-06-10 DIAGNOSIS — I480 Paroxysmal atrial fibrillation: Secondary | ICD-10-CM

## 2023-06-10 DIAGNOSIS — R0600 Dyspnea, unspecified: Secondary | ICD-10-CM

## 2023-06-10 DIAGNOSIS — I10 Essential (primary) hypertension: Secondary | ICD-10-CM

## 2023-06-10 MED ORDER — RIVAROXABAN 20 MG PO TABS
20.0000 mg | ORAL_TABLET | Freq: Every day | ORAL | 11 refills | Status: DC
Start: 2023-06-10 — End: 2024-06-14

## 2023-06-10 MED ORDER — METOPROLOL SUCCINATE ER 50 MG PO TB24: ORAL_TABLET | ORAL | 3 refills | Status: DC

## 2023-06-10 NOTE — Patient Instructions (Signed)
Medication Instructions:   Your physician recommends that you continue on your current medications as directed. Please refer to the Current Medication list given to you today.  *If you need a refill on your cardiac medications before your next appointment, please call your pharmacy*   Lab Work:  BMET AND CBC TODAY    If you have labs (blood work) drawn today and your tests are completely normal, you will receive your results only by: MyChart Message (if you have MyChart) OR A paper copy in the mail If you have any lab test that is abnormal or we need to change your treatment, we will call you to review the results.   Testing/Procedures: NONE ORDERED  TODAY     Follow-Up: At [facility], you and your health needs are our priority.  As part of our continuing mission to provide you with exceptional heart care, we have created designated Provider Care Teams.  These Care Teams include your primary Cardiologist (physician) and Advanced Practice Providers (APPs -  Physician Assistants and Nurse Practitioners) who all work together to provide you with the care you need, when you need it.  We recommend signing up for the patient portal called "MyChart".  Sign up information is provided on this After Visit Summary.  MyChart is used to connect with patients for Virtual Visits (Telemedicine).  Patients are able to view lab/test results, encounter notes, upcoming appointments, etc.  Non-urgent messages can be sent to your provider as well.   To learn more about what you can do with MyChart, go to [email or web address].    Your next appointment:   1 year(s)  Provider:   Sherryl Manges, MD    Other Instructions

## 2023-06-11 LAB — BASIC METABOLIC PANEL
BUN/Creatinine Ratio: 17 (ref 12–28)
BUN: 12 mg/dL (ref 8–27)
CO2: 24 mmol/L (ref 20–29)
Calcium: 9.9 mg/dL (ref 8.7–10.3)
Chloride: 96 mmol/L (ref 96–106)
Creatinine, Ser: 0.7 mg/dL (ref 0.57–1.00)
Glucose: 88 mg/dL (ref 70–99)
Potassium: 4.3 mmol/L (ref 3.5–5.2)
Sodium: 135 mmol/L (ref 134–144)
eGFR: 94 mL/min/{1.73_m2} (ref >59)

## 2023-06-11 LAB — CBC
Hematocrit: 42.2 % (ref 34.0–46.6)
Hemoglobin: 13.8 g/dL (ref 11.1–15.9)
MCH: 31.2 pg (ref 26.6–33.0)
MCHC: 32.7 g/dL (ref 31.5–35.7)
MCV: 95 fL (ref 79–97)
Platelets: 322 10*3/uL (ref 150–450)
RBC: 4.43 x10E6/uL (ref 3.77–5.28)
RDW: 12.1 % (ref 11.7–15.4)
WBC: 7.1 10*3/uL (ref 3.4–10.8)

## 2023-07-11 ENCOUNTER — Other Ambulatory Visit: Payer: Self-pay | Admitting: Family Medicine

## 2023-07-27 DIAGNOSIS — H5203 Hypermetropia, bilateral: Secondary | ICD-10-CM | POA: Diagnosis not present

## 2023-08-10 DIAGNOSIS — R69 Illness, unspecified: Secondary | ICD-10-CM | POA: Diagnosis not present

## 2023-08-26 ENCOUNTER — Other Ambulatory Visit: Payer: Self-pay | Admitting: Family Medicine

## 2023-08-27 ENCOUNTER — Other Ambulatory Visit: Payer: Self-pay | Admitting: Family Medicine

## 2023-09-02 DIAGNOSIS — R69 Illness, unspecified: Secondary | ICD-10-CM | POA: Diagnosis not present

## 2023-09-05 ENCOUNTER — Other Ambulatory Visit: Payer: Self-pay | Admitting: Family Medicine

## 2023-09-06 ENCOUNTER — Telehealth: Payer: Self-pay | Admitting: Family Medicine

## 2023-09-06 NOTE — Telephone Encounter (Signed)
Prescription Request  09/06/2023  LOV: 12/01/2022  What is the name of the medication or equipment? rosuvastatin (CRESTOR) 40 MG tablet . Pt has cpe sch for 10-18-2023  Have you contacted your pharmacy to request a refill? No   Which pharmacy would you like this sent to?  [phone] [place] [place] - [address] [place] [address] Phone: [phone] Fax: [phone]      Patient notified that their request is being sent to the clinical staff for review and that they should receive a response within 2 business days.   Please advise at Mobile [phone] (mobile)

## 2023-09-07 MED ORDER — ROSUVASTATIN CALCIUM 40 MG PO TABS: 40.0000 mg | ORAL_TABLET | Freq: Every day | ORAL | 1 refills | Status: DC

## 2023-09-07 NOTE — Telephone Encounter (Signed)
Rx sent 

## 2023-10-18 ENCOUNTER — Encounter: Payer: Medicare HMO | Admitting: Family Medicine

## 2023-10-22 ENCOUNTER — Other Ambulatory Visit: Payer: Self-pay | Admitting: Family Medicine

## 2023-11-01 ENCOUNTER — Encounter: Payer: Self-pay | Admitting: Family Medicine

## 2023-11-01 ENCOUNTER — Ambulatory Visit (INDEPENDENT_AMBULATORY_CARE_PROVIDER_SITE_OTHER): Payer: PPO | Admitting: Family Medicine

## 2023-11-01 VITALS — BP 136/70 | HR 64 | Temp 98.2°F | Ht 67.0 in | Wt 190.7 lb

## 2023-11-01 DIAGNOSIS — M810 Age-related osteoporosis without current pathological fracture: Secondary | ICD-10-CM | POA: Diagnosis not present

## 2023-11-01 DIAGNOSIS — E785 Hyperlipidemia, unspecified: Secondary | ICD-10-CM

## 2023-11-01 DIAGNOSIS — I10 Essential (primary) hypertension: Secondary | ICD-10-CM | POA: Diagnosis not present

## 2023-11-01 DIAGNOSIS — Z0000 Encounter for general adult medical examination without abnormal findings: Secondary | ICD-10-CM

## 2023-11-01 LAB — COMPREHENSIVE METABOLIC PANEL
ALT: 23 U/L (ref 0–35)
AST: 25 U/L (ref 0–37)
Albumin: 4.6 g/dL (ref 3.5–5.2)
Alkaline Phosphatase: 61 U/L (ref 39–117)
BUN: 12 mg/dL (ref 6–23)
CO2: 27 meq/L (ref 19–32)
Calcium: 9.7 mg/dL (ref 8.4–10.5)
Chloride: 98 meq/L (ref 96–112)
Creatinine, Ser: 0.67 mg/dL (ref 0.40–1.20)
GFR: 89.38 mL/min (ref >60.00)
Glucose, Bld: 95 mg/dL (ref 70–99)
Potassium: 4.3 meq/L (ref 3.5–5.1)
Sodium: 134 meq/L — ABNORMAL LOW (ref 135–145)
Total Bilirubin: 0.9 mg/dL (ref 0.2–1.2)
Total Protein: 7.5 g/dL (ref 6.0–8.3)

## 2023-11-01 LAB — VITAMIN D 25 HYDROXY (VIT D DEFICIENCY, FRACTURES): VITD: 33.5 ng/mL (ref 30.00–100.00)

## 2023-11-01 LAB — LIPID PANEL
Cholesterol: 206 mg/dL — ABNORMAL HIGH (ref 0–200)
HDL: 86.7 mg/dL (ref >39.00)
LDL Cholesterol: 104 mg/dL — ABNORMAL HIGH (ref 0–99)
NonHDL: 119.76
Total CHOL/HDL Ratio: 2
Triglycerides: 78 mg/dL (ref 0.0–149.0)
VLDL: 15.6 mg/dL (ref 0.0–40.0)

## 2023-11-01 MED ORDER — ROSUVASTATIN CALCIUM 40 MG PO TABS: 40.0000 mg | ORAL_TABLET | Freq: Every day | ORAL | 3 refills | Status: DC

## 2023-11-01 NOTE — Progress Notes (Signed)
Established Patient Office Visit  Subjective   Patient ID: Stephanie [name], female    DOB: [birth year]  Age: 68 y.o. MRN: [id number]  Chief Complaint  Patient presents with   Annual Exam    HPI   Gita is here today for physical exam.  Past medical history reviewed.  She has history of hypertension, paroxysmal atrial fibrillation, hyperlipidemia.  Past history of tobacco abuse.  Stopped smoking about 14 years ago.  Current medications include fluoxetine, Zetia, Cardizem CD, Crestor, valsartan HCTZ, and Toprol as well as Xarelto.  She takes Fosamax per her GYN for osteoporosis.  Generally doing well.  She is retired.  Recently started tracking her steps with Fitbit and hopes to increase her walking activities.  No recent falls.  Health maintenance reviewed:  Health Maintenance  Topic Date Due   Lung Cancer Screening  Never done   Zoster Vaccines- Shingrix (1 of 2) 09/25/2004   DTaP/Tdap/Td (2 - Tdap) 12/12/2019   Medicare Annual Wellness (AWV)  11/05/2022   MAMMOGRAM  12/21/2024   Colonoscopy  09/10/2026   Pneumonia Vaccine 25+ Years old  Completed   INFLUENZA VACCINE  Completed   DEXA SCAN  Completed   COVID-19 Vaccine  Completed   Hepatitis C Screening  Completed   HPV VACCINES  Aged Out   -She has had Zostavax but not Shingrix.  She is considering Shingrix at some point this year. -Discussed lung cancer screening.  She is undecided at this point. -She is getting regular mammograms yearly. -Colonoscopy due in 2 years. -DEXA scans through GYN. Social history-married.  Smoking history as above.  Does drink several beers per day.  Retired 2019.  2 children   Family history-father and brother with heart disease in their 8s  Past Medical History:  Diagnosis Date   Diverticulitis 06/2016   Hyperlipidemia    Hypertension    Osteoporosis    Paroxysmal atrial fibrillation (HCC) 01/26/2016   History reviewed. No pertinent surgical history.  reports that she quit smoking  about 13 years ago. Her smoking use included cigarettes. She started smoking about 43 years ago. She has a 30 pack-year smoking history. She has never used smokeless tobacco. She reports current alcohol use of about 2.0 standard drinks of alcohol per week. She reports that she does not use drugs. family history includes Alzheimer's disease in her mother; Heart disease (age of onset: 73) in her brother and father; Sudden death in an other family member. No Known Allergies    Review of Systems  Constitutional:  Negative for chills, fever, malaise/fatigue and weight loss.  HENT:  Negative for hearing loss.   Eyes:  Negative for blurred vision and double vision.  Respiratory:  Negative for cough and shortness of breath.   Cardiovascular:  Negative for chest pain, palpitations and leg swelling.  Gastrointestinal:  Negative for abdominal pain, blood in stool, constipation and diarrhea.  Genitourinary:  Negative for dysuria.  Skin:  Negative for rash.  Neurological:  Negative for dizziness, speech change, seizures, loss of consciousness and headaches.  Psychiatric/Behavioral:  Negative for depression.       Objective:     BP 136/70 (BP Location: Left Arm, Patient Position: Sitting, Cuff Size: Large)   Pulse 64   Temp 98.2 F (36.8 C) (Oral)   Ht 5\' 7"  (1.702 m)   Wt 190 lb 11.2 oz (86.5 kg)   SpO2 98%   BMI 29.87 kg/m  BP Readings from Last 3 Encounters:  11/01/23 136/70  06/10/23 (!) 160/92  12/01/22 (!) 160/80   Wt Readings from Last 3 Encounters:  11/01/23 190 lb 11.2 oz (86.5 kg)  06/10/23 190 lb 9.6 oz (86.5 kg)  12/01/22 190 lb 4.8 oz (86.3 kg)      Physical Exam Vitals reviewed.  Constitutional:      General: She is not in acute distress.    Appearance: She is well-developed.  HENT:     Head: Normocephalic and atraumatic.  Eyes:     Pupils: Pupils are equal, round, and reactive to light.  Neck:     Thyroid: No thyromegaly.  Cardiovascular:     Rate and Rhythm:  Normal rate and regular rhythm.     Heart sounds: Normal heart sounds. No murmur heard. Pulmonary:     Effort: No respiratory distress.     Breath sounds: Normal breath sounds. No wheezing or rales.  Abdominal:     General: Bowel sounds are normal. There is no distension.     Palpations: Abdomen is soft. There is no mass.     Tenderness: There is no abdominal tenderness. There is no guarding or rebound.  Musculoskeletal:        General: Normal range of motion.     Cervical back: Normal range of motion and neck supple.  Lymphadenopathy:     Cervical: No cervical adenopathy.  Skin:    Findings: No rash.  Neurological:     Mental Status: She is alert and oriented to person, place, and time.     Cranial Nerves: No cranial nerve deficit.  Psychiatric:        Behavior: Behavior normal.        Thought Content: Thought content normal.        Judgment: Judgment normal.      No results found for any visits on 11/01/23.    The 10-year ASCVD risk score (Arnett DK, et al., 2019) is: 11.4%    Assessment & Plan:   Physical exam.  She has chronic problems as above.  Blood pressure stable today.  Currently appears to be in sinus rhythm.  She has not had A-fib in some time.  She remains on Cardizem and Xarelto per cardiology.  We discussed several health maintenance items as below  -She plans to continue GYN follow-up for DEXA scan and mammograms -Did encouraged her to consider Shingrix vaccine and she will consider at some point this year -Pneumonia vaccines complete -Flu vaccine already given -Colonoscopy due 2027 -Recommend increasing her physical exercise.  She is currently counting steps and is trying to slowly progress with that.  She does have a treadmill at home. -We have asked that she check with her gynecologist regarding duration of Fosamax and consider break off medication if exceeding 5 years -Discussed low-dose lung cancer screening.  She declines at this time but will  consider.  No follow-ups on file.    Evelena Peat, MD

## 2023-11-01 NOTE — Patient Instructions (Signed)
Consider Shingrix vaccine at some point this year.  

## 2023-11-14 ENCOUNTER — Other Ambulatory Visit: Payer: Self-pay | Admitting: Family Medicine

## 2023-12-03 ENCOUNTER — Other Ambulatory Visit: Payer: Self-pay | Admitting: Family Medicine

## 2024-03-02 ENCOUNTER — Other Ambulatory Visit: Payer: Self-pay | Admitting: Family Medicine

## 2024-03-14 DIAGNOSIS — Z01411 Encounter for gynecological examination (general) (routine) with abnormal findings: Secondary | ICD-10-CM | POA: Diagnosis not present

## 2024-03-14 DIAGNOSIS — Z124 Encounter for screening for malignant neoplasm of cervix: Secondary | ICD-10-CM | POA: Diagnosis not present

## 2024-03-14 DIAGNOSIS — Z1231 Encounter for screening mammogram for malignant neoplasm of breast: Secondary | ICD-10-CM | POA: Diagnosis not present

## 2024-03-14 DIAGNOSIS — Z1331 Encounter for screening for depression: Secondary | ICD-10-CM | POA: Diagnosis not present

## 2024-03-14 DIAGNOSIS — Z01419 Encounter for gynecological examination (general) (routine) without abnormal findings: Secondary | ICD-10-CM | POA: Diagnosis not present

## 2024-03-14 LAB — HM MAMMOGRAPHY

## 2024-03-15 ENCOUNTER — Encounter: Payer: Self-pay | Admitting: Obstetrics and Gynecology

## 2024-04-16 ENCOUNTER — Other Ambulatory Visit: Payer: Self-pay | Admitting: Family Medicine

## 2024-05-10 ENCOUNTER — Other Ambulatory Visit: Payer: Self-pay | Admitting: Family Medicine

## 2024-05-15 ENCOUNTER — Other Ambulatory Visit: Payer: Self-pay | Admitting: Family Medicine

## 2024-06-14 ENCOUNTER — Other Ambulatory Visit: Payer: Self-pay | Admitting: Physician Assistant

## 2024-06-14 DIAGNOSIS — I480 Paroxysmal atrial fibrillation: Secondary | ICD-10-CM

## 2024-06-14 NOTE — Telephone Encounter (Signed)
 Prescription refill request for Xarelto  received.  Indication: afib  Last office visit: Stephanie [name] 06/10/2023 Weight: 86.5 kg  Age: 68 yo  Scr: 0.67, 11/01/2023 CrCl: 108 ml/min   Pt is overdue for an office visit. Message sent to schedulers.
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
## 2024-07-04 ENCOUNTER — Ambulatory Visit: Attending: Cardiology | Admitting: Physician Assistant

## 2024-07-04 VITALS — BP 138/74 | HR 69 | Ht 67.0 in | Wt 193.0 lb

## 2024-07-04 DIAGNOSIS — I480 Paroxysmal atrial fibrillation: Secondary | ICD-10-CM

## 2024-07-04 DIAGNOSIS — I10 Essential (primary) hypertension: Secondary | ICD-10-CM | POA: Diagnosis not present

## 2024-07-04 MED ORDER — METOPROLOL SUCCINATE ER 50 MG PO TB24: ORAL_TABLET | ORAL | 3 refills | Status: AC

## 2024-07-04 NOTE — Patient Instructions (Addendum)
 Medication Instructions:   Your physician recommends that you continue on your current medications as directed. Please refer to the Current Medication list given to you today.   *If you need a refill on your cardiac medications before your next appointment, please call your pharmacy*    Lab Work:  NONE ORDERED  TODAY     If you have labs (blood work) drawn today and your tests are completely normal, you will receive your results only by: MyChart Message (if you have MyChart) OR A paper copy in the mail If you have any lab test that is abnormal or we need to change your treatment, we will call you to review the results.   Testing/Procedures: NONE ORDERED  TODAY     Follow-Up: At [facility], you and your health needs are our priority.  As part of our continuing mission to provide you with exceptional heart care, our providers are all part of one team.  This team includes your primary Cardiologist (physician) and Advanced Practice Providers or APPs (Physician Assistants and Nurse Practitioners) who all work together to provide you with the care you need, when you need it.  Your next appointment:    1 year(s)   Provider:   Dr Almetta    We recommend signing up for the patient portal called MyChart.  Sign up information is provided on this After Visit Summary.  MyChart is used to connect with patients for Virtual Visits (Telemedicine).  Patients are able to view lab/test results, encounter notes, upcoming appointments, etc.  Non-urgent messages can be sent to your provider as well.   To learn more about what you can do with MyChart, go to [email or web address].   Other Instructions

## 2024-07-17 ENCOUNTER — Other Ambulatory Visit: Payer: Self-pay | Admitting: Physician Assistant

## 2024-07-17 DIAGNOSIS — I480 Paroxysmal atrial fibrillation: Secondary | ICD-10-CM

## 2024-07-17 NOTE — Telephone Encounter (Signed)
 Prescription refill request for Xarelto  received.  Indication:afib Last office visit:9/25 Weight:87.5  kg Age:68 Scr:0.67  1/25 CrCl:109.47  ml/min  Prescription refilled

## 2024-08-16 ENCOUNTER — Encounter: Payer: Self-pay | Admitting: Family Medicine

## 2024-08-16 ENCOUNTER — Ambulatory Visit (INDEPENDENT_AMBULATORY_CARE_PROVIDER_SITE_OTHER)

## 2024-08-16 ENCOUNTER — Other Ambulatory Visit: Payer: Self-pay

## 2024-08-16 ENCOUNTER — Ambulatory Visit: Admitting: Family Medicine

## 2024-08-16 VITALS — BP 150/82 | HR 64 | Ht 67.0 in | Wt 195.0 lb

## 2024-08-16 DIAGNOSIS — M25561 Pain in right knee: Secondary | ICD-10-CM

## 2024-08-16 DIAGNOSIS — G8929 Other chronic pain: Secondary | ICD-10-CM

## 2024-08-16 NOTE — Progress Notes (Signed)
 I, Stephanie [name], CMA acting as a scribe for Stephanie Lloyd, MD.  Stephanie [name] is a 67 y.o. female who presents to [facility] today for R knee pain x 3 weeks, no known injury. Pt locates pain to posterior aspect. Sx worse with WB and ambulation. Improves some after several steps. Denies visible swelling. Denies mechanical sx. Notes right hip pain radiating into the R LE but not beyond the knee. Low back feels OK. Minimal relief with BioFreeze or Tylenol . Notes more trips up the ladder / steps recently.   R Knee swelling: denies Mechanical symptoms: denies Aggravates: WB Treatments tried: Tylenol , BioFreeze  Dx testing: 01/18/20 R knee XR  Pertinent review of systems: No fevers or chills  Relevant historical information: Hypertension and atrial fibrillation.   Exam:  BP (!) 150/82   Pulse 64   Ht 5' 7 (1.702 m)   Wt 195 lb (88.5 kg)   SpO2 94%   BMI 30.54 kg/m  General: Well Developed, well nourished, and in no acute distress.   MSK: Right knee normal-appearing Normal motion.  Tender palpation medial joint line. Some laxity with MCL stress test.  Intact strength.    Lab and Radiology Results  Procedure: Real-time Ultrasound Guided Injection of right knee joint superior lateral patella space Device: Philips Affiniti 50G/GE Logiq Images permanently stored and available for review in PACS Verbal informed consent obtained.  Discussed risks and benefits of procedure. Warned about infection, bleeding, hyperglycemia damage to structures among others. Patient expresses understanding and agreement Time-out conducted.   Noted no overlying erythema, induration, or other signs of local infection.   Skin prepped in a sterile fashion.   Local anesthesia: Topical Ethyl chloride.   With sterile technique and under real time ultrasound guidance: 40 mg of Kenalog and 2 mL of Marcaine injected into knee joint. Fluid seen entering the joint capsule.    Completed without difficulty   Pain immediately resolved suggesting accurate placement of the medication.   Advised to call if fevers/chills, erythema, induration, drainage, or persistent bleeding.   Images permanently stored and available for review in the ultrasound unit.  Impression: Technically successful ultrasound guided injection.    X-ray images right knee obtained today personally and independently interpreted. Mild medial DJD.  Mild chondrocalcinosis is visible.  No acute fractures are present. Await formal radiology review   Assessment and Plan: 68 y.o. female with right knee pain due to exacerbation of DJD or degenerative meniscus tear or possibly even pseudogout.  Plan for steroid injection and quad strengthening exercises.  Recommend Voltaren gel.  If not improved consider physical therapy.   PDMP not reviewed this encounter. Orders Placed This Encounter  Procedures   US  LIMITED JOINT SPACE STRUCTURES LOW RIGHT(NO LINKED CHARGES)    Reason for Exam (SYMPTOM  OR DIAGNOSIS REQUIRED):   right knee pain    Preferred imaging location?:   [facility] Knee AP/LAT W/[place] Right    Standing Status:   Future    Number of Occurrences:   1    Expiration Date:   09/15/2024    Reason for Exam (SYMPTOM  OR DIAGNOSIS REQUIRED):   right knee pain    Preferred imaging location?:   [facility]   No orders of the defined types were placed in this encounter.    Discussed warning signs or symptoms. Please see discharge instructions. Patient expresses understanding.   The above documentation has been reviewed and  is accurate and complete Stephanie [name], M.D.

## 2024-08-16 NOTE — Patient Instructions (Addendum)
 Thank you for coming in today.   You received an injection today. Seek immediate medical attention if the joint becomes red, extremely painful, or is oozing fluid.   Please get an Xray today before you leave   Please use Voltaren gel (Generic Diclofenac Gel) up to 4x daily for pain as needed.  This is available over-the-counter as both the name brand Voltaren gel and the generic diclofenac gel.   Tylenol  Arthritis   Work on [employer]

## 2024-08-22 ENCOUNTER — Ambulatory Visit: Payer: Self-pay | Admitting: Family Medicine

## 2024-08-22 NOTE — Progress Notes (Signed)
 Right knee x-ray shows mild arthritis.

## 2024-09-11 NOTE — Progress Notes (Unsigned)
 Ben Ardel Jagger D.[facility] [address] [place] [address] Phone: [phone]   Assessment and Plan:     1. Primary osteoarthritis of right knee (Primary) 2. Chronic pain of right knee -Chronic with exacerbation, subsequent visit - Continued knee pain.  Differential includes flare of mild osteoarthritis versus meniscal pathology. - Patient continues to experience right knee pain, now currently primarily medial sided, sensations of knee wanting to give out, and pain with weightbearing.  Patient only experience relief for 4 days after intra-articular CSI. -Recommend MRI of right knee due to failure to improve despite >6 weeks of conservative therapy, pain with day-to-day activities, pain >6/10 - Provided and instructed on additional HEP for her knee at today's visit - Do not recommend p.o. NSAIDs or prednisone  due to past medical history of PAF on chronic anticoagulation with Xarelto  - Reviewed patient's x-ray with patient in clinic.  Discussed mild degenerative changes in medial and patellofemoral compartments - Will order prior authorization for hyaluronic acid injection.  Could consider HA injection at follow-up visit based on MRI results  15 additional minutes spent for educating Therapeutic Home Exercise Program.  This included exercises focusing on stretching, strengthening, with focus on eccentric aspects.   Long term goals include an improvement in range of motion, strength, endurance as well as avoiding reinjury. Patient's frequency would include in 1-2 times a day, 3-5 times a week for a duration of 6-12 weeks. Proper technique shown and discussed handout in great detail with ATC.  All questions were discussed and answered.    Pertinent previous records reviewed include right knee x-ray   Follow Up: 1 week after MRI to review results and discuss treatment plan.   Subjective:   I, Chestine Reeves, am serving as a neurosurgeon for Doctor Morene Mace  Chief Complaint: Right knee pain   HPI:   09/12/2024 Patient is a 68 year old female presenting to [facility] for right knee pain. Patient saw Dr. Joane on 08/16/2024 where they stated pain going on for 3 weeks, worse when weight bearing, and tried tylenol  and bio freeze with minimal relief. Patient was given a knee injection and exercises by Dr. Joane and he recommended using Voltaren gel. Patient states that her pain has gotten worse. Pain is now medial. Decreased ROM . Antalgic gait. Pain when applying pressure. CSI pain came back about 4 days after.   Today patient states still having pain in the right knee, the injection lasted 4 days  Relevant Historical Information: Hypertension Atrial fibrillation   Additional pertinent review of systems negative.   Current Outpatient Medications:    alendronate (FOSAMAX) 70 MG tablet, Take 70 mg by mouth once a week., Disp: , Rfl:    ALPRAZolam  (XANAX ) 0.5 MG tablet, Take 1 tablet (0.5 mg total) by mouth 3 (three) times daily as needed for anxiety., Disp: 30 tablet, Rfl: 0   diltiazem  (CARDIZEM  CD) 240 MG 24 hr capsule, TAKE 1 CAPSULE BY MOUTH EVERY DAY, Disp: 90 capsule, Rfl: 1   ezetimibe  (ZETIA ) 10 MG tablet, TAKE 1 TABLET BY MOUTH EVERY DAY, Disp: 90 tablet, Rfl: 1   FLUoxetine  (PROZAC ) 20 MG capsule, TAKE 1 CAPSULE BY MOUTH EVERY DAY, Disp: 90 capsule, Rfl: 1   HYDROcodone -acetaminophen  (NORCO/VICODIN) 5-325 MG tablet, Take 1 tablet by mouth every 6 (six) hours as needed for moderate pain., Disp: 20 tablet, Rfl: 0   metoprolol  succinate (TOPROL -XL) 50 MG 24 hr tablet, Take 1/2 tablet by mouth  if needed for breakthrough afib for HR >100 as long as BP > 100, Disp: 45 tablet, Rfl: 3   rivaroxaban  (XARELTO ) 20 MG TABS tablet, Take 1 tablet (20 mg total) by mouth daily with supper., Disp: 30 tablet, Rfl: 5   rosuvastatin  (CRESTOR ) 40 MG tablet, Take 1 tablet (40 mg total) by mouth daily., Disp: 90 tablet, Rfl: 3    valsartan -hydrochlorothiazide  (DIOVAN -HCT) 160-12.5 MG tablet, TAKE 1 TABLET BY MOUTH EVERY DAY, Disp: 90 tablet, Rfl: 1  Current Facility-Administered Medications:    0.9 %  sodium chloride  infusion, 500 mL, Intravenous, Continuous, Nandigam, Kavitha V, MD   Objective:     Vitals:   09/12/24 0950  BP: 122/78  Pulse: 61  SpO2: 99%  Weight: 193 lb (87.5 kg)  Height: 5' 7 (1.702 m)      Body mass index is 30.23 kg/m.    Physical Exam:    General:  awake, alert oriented, no acute distress nontoxic Skin: no suspicious lesions or rashes Neuro:sensation intact and strength 5/5 with no deficits, no atrophy, normal muscle tone Psych: No signs of anxiety, depression or other mood disorder  Right knee: Crepitus present No swelling No deformity Neg fluid wave, joint milking ROM Flex 110, Ext 0 TTP medial joint line, medial femoral condyle NTTP over the quad tendon,   lat fem condyle, patella, patella tendon, tibial tuberostiy, fibular head, posterior fossa, pes anserine bursa, gerdy's tubercle, lateral jt line Neg anterior and posterior drawer Neg lachman Negative varus stress Negative valgus stress Negative McMurray Positive Thessaly  Gait normal    Electronically signed by:  Odis Mace D.[facility] 10:52 AM 09/12/24

## 2024-09-12 ENCOUNTER — Ambulatory Visit: Admitting: Sports Medicine

## 2024-09-12 ENCOUNTER — Telehealth: Payer: Self-pay | Admitting: Sports Medicine

## 2024-09-12 VITALS — BP 122/78 | HR 61 | Ht 67.0 in | Wt 193.0 lb

## 2024-09-12 DIAGNOSIS — M1711 Unilateral primary osteoarthritis, right knee: Secondary | ICD-10-CM | POA: Diagnosis not present

## 2024-09-12 DIAGNOSIS — G8929 Other chronic pain: Secondary | ICD-10-CM

## 2024-09-12 DIAGNOSIS — M25561 Pain in right knee: Secondary | ICD-10-CM

## 2024-09-12 NOTE — Telephone Encounter (Signed)
 R knee HA

## 2024-09-12 NOTE — Telephone Encounter (Signed)
 Ran monovisc benefits for right knee case ID [id number]

## 2024-09-12 NOTE — Patient Instructions (Addendum)
 MRI knee   Follow up 1 week after to discuss results   HA prior auth   Tylenol  [phone] mg 2-3 times a day for pain relief   Knee HEP

## 2024-09-14 NOTE — Telephone Encounter (Signed)
 Spoke to patient. She said that her knee is feeling better. Should she hold off on the MRI at this point and wait to see if it bothers her again?

## 2024-09-14 NOTE — Telephone Encounter (Signed)
 Patient aware.  She will cancel the MRI appointment and call when needed.

## 2024-09-14 NOTE — Telephone Encounter (Signed)
 Patient is scheduled for her MRI on 12/15. Should she hold on the injection until after we get the results?

## 2024-09-14 NOTE — Telephone Encounter (Signed)
 Please schedule patient when medication is stocked  MONOVISC authorized for right knee NO PRE CERT REQUIRED  [employer] $20 Deductible does not apply  OOP MX $3400 has met $10.68 Once OOP has been met coverage goes to 100% Reference # [id number]

## 2024-09-24 ENCOUNTER — Other Ambulatory Visit

## 2024-10-15 ENCOUNTER — Encounter: Payer: Self-pay | Admitting: Sports Medicine

## 2024-10-24 ENCOUNTER — Ambulatory Visit
Admission: RE | Admit: 2024-10-24 | Discharge: 2024-10-24 | Disposition: A | Discharge status: Home / Self Care | Source: Ambulatory Visit | Attending: Sports Medicine | Admitting: Sports Medicine

## 2024-10-24 DIAGNOSIS — M1711 Unilateral primary osteoarthritis, right knee: Secondary | ICD-10-CM

## 2024-10-24 DIAGNOSIS — G8929 Other chronic pain: Secondary | ICD-10-CM

## 2024-10-25 ENCOUNTER — Ambulatory Visit: Payer: Self-pay | Admitting: Sports Medicine

## 2024-10-30 NOTE — Progress Notes (Unsigned)
 "               Stephanie [name] D.[facility] [address] [place] [address] Phone: [phone]   Assessment and Plan:     1. Primary osteoarthritis of right knee (Primary) 2. Chronic pain of right knee 3. Acute medial meniscus tear of right knee, initial encounter -Chronic with exacerbation, subsequent visit - Continued right knee pain consistent with Radial tear at the root of the medial meniscus with slight medial displacement of body, mild osteoarthritis as seen on right knee MRI - Patient has had no significant relief with conservative therapy that has included relative rest, HEP, intra-articular CSI - Do not recommend p.o. NSAIDs or prednisone  due to past medical history of PAF on chronic anticoagulation with Xarelto  - Based on failure to improve with conservative therapy, MRI results, recommend further evaluation with orthopedic surgery.  Referral sent    Pertinent previous records reviewed include right knee MRI 10/24/2024 IMPRESSION: 1.  Mild tricompartmental osteoarthrosis most notably the medial compartment and lateral patellofemoral compartment. There is mild subchondral reactive edema in the medial compartment. 2.  Radial tear at the root of the medial meniscus with slight medial displacement of body. 3.  Moderate reactive joint effusion and leaking Baker's cyst.   Follow Up: As needed   Subjective:   I, Chestine Reeves, am serving as a neurosurgeon for Doctor Morene [name]   Chief Complaint: Right knee pain    HPI:    09/12/2024 Patient is a 69 year old female presenting to [facility] for right knee pain. Patient saw Dr. Joane on 08/16/2024 where they stated pain going on for 3 weeks, worse when weight bearing, and tried tylenol  and bio freeze with minimal relief. Patient was given a knee injection and exercises by Dr. Joane and he recommended using Voltaren gel. Patient states that her pain has gotten worse. Pain is now medial.  Decreased ROM . Antalgic gait. Pain when applying pressure. CSI pain came back about 4 days after.    Today patient states still having pain in the right knee, the injection lasted 4 days  10/31/2024 Patient states she is the same    Relevant Historical Information: Hypertension Atrial fibrillation   Additional pertinent review of systems negative.  Current Medications[1]   Objective:     Vitals:   10/31/24 1111  Pulse: 67  SpO2: 94%  Weight: 194 lb (88 kg)  Height: 5' 7 (1.702 m)      Body mass index is 30.38 kg/m.    Physical Exam:    General:  awake, alert oriented, no acute distress nontoxic Skin: no suspicious lesions or rashes Neuro:sensation intact and strength 5/5 with no deficits, no atrophy, normal muscle tone Psych: No signs of anxiety, depression or other mood disorder   Right knee: Crepitus present No swelling No deformity Neg fluid wave, joint milking ROM Flex 110, Ext 0 TTP medial joint line, medial femoral condyle NTTP over the quad tendon,   lat fem condyle, patella, patella tendon, tibial tuberostiy, fibular head, posterior fossa, pes anserine bursa, gerdy's tubercle, lateral jt line Neg anterior and posterior drawer Neg lachman Negative varus stress Negative valgus stress Negative McMurray Positive Thessaly   Gait normal    Electronically signed by:  Stephanie [name] D.[facility] 11:39 AM 10/31/24     [1]  Current Outpatient Medications:    alendronate (FOSAMAX) 70 MG tablet, Take 70 mg by mouth once a week., Disp: ,  Rfl:    ALPRAZolam  (XANAX ) 0.5 MG tablet, Take 1 tablet (0.5 mg total) by mouth 3 (three) times daily as needed for anxiety., Disp: 30 tablet, Rfl: 0   diltiazem  (CARDIZEM  CD) 240 MG 24 hr capsule, TAKE 1 CAPSULE BY MOUTH EVERY DAY, Disp: 90 capsule, Rfl: 1   ezetimibe  (ZETIA ) 10 MG tablet, TAKE 1 TABLET BY MOUTH EVERY DAY, Disp: 90 tablet, Rfl: 1   FLUoxetine  (PROZAC ) 20 MG capsule, TAKE 1 CAPSULE BY  MOUTH EVERY DAY, Disp: 90 capsule, Rfl: 1   HYDROcodone -acetaminophen  (NORCO/VICODIN) 5-325 MG tablet, Take 1 tablet by mouth every 6 (six) hours as needed for moderate pain., Disp: 20 tablet, Rfl: 0   metoprolol  succinate (TOPROL -XL) 50 MG 24 hr tablet, Take 1/2 tablet by mouth if needed for breakthrough afib for HR >100 as long as BP > 100, Disp: 45 tablet, Rfl: 3   rivaroxaban  (XARELTO ) 20 MG TABS tablet, Take 1 tablet (20 mg total) by mouth daily with supper., Disp: 30 tablet, Rfl: 5   rosuvastatin  (CRESTOR ) 40 MG tablet, Take 1 tablet (40 mg total) by mouth daily., Disp: 90 tablet, Rfl: 3   valsartan -hydrochlorothiazide  (DIOVAN -HCT) 160-12.5 MG tablet, TAKE 1 TABLET BY MOUTH EVERY DAY, Disp: 90 tablet, Rfl: 1  Current Facility-Administered Medications:    0.9 %  sodium chloride  infusion, 500 mL, Intravenous, Continuous, Nandigam, Kavitha V, MD  "

## 2024-10-31 ENCOUNTER — Ambulatory Visit: Admitting: Sports Medicine

## 2024-10-31 VITALS — HR 67 | Ht 67.0 in | Wt 194.0 lb

## 2024-10-31 DIAGNOSIS — S83241A Other tear of medial meniscus, current injury, right knee, initial encounter: Secondary | ICD-10-CM

## 2024-10-31 DIAGNOSIS — M25561 Pain in right knee: Secondary | ICD-10-CM

## 2024-10-31 DIAGNOSIS — M1711 Unilateral primary osteoarthritis, right knee: Secondary | ICD-10-CM

## 2024-10-31 DIAGNOSIS — G8929 Other chronic pain: Secondary | ICD-10-CM

## 2024-10-31 NOTE — Patient Instructions (Signed)
 Ortho referral  As needed follow up

## 2024-11-07 ENCOUNTER — Other Ambulatory Visit: Payer: Self-pay | Admitting: Family Medicine

## 2024-11-15 ENCOUNTER — Ambulatory Visit: Admitting: Orthopedic Surgery

## 2024-11-15 DIAGNOSIS — M25561 Pain in right knee: Secondary | ICD-10-CM

## 2024-12-13 ENCOUNTER — Ambulatory Visit: Admitting: Orthopedic Surgery
